# Patient Record
Sex: Female | Born: 1955 | Race: White | Hispanic: No | Marital: Married | Smoking: Never smoker
Health system: Southern US, Community
[De-identification: ages and names within clinical notes are randomized; demographics above are authoritative.]

## PROBLEM LIST (undated history)

## (undated) DIAGNOSIS — M419 Scoliosis, unspecified: Secondary | ICD-10-CM

## (undated) DIAGNOSIS — Z9221 Personal history of antineoplastic chemotherapy: Secondary | ICD-10-CM

## (undated) DIAGNOSIS — Z923 Personal history of irradiation: Secondary | ICD-10-CM

## (undated) DIAGNOSIS — R51 Headache: Secondary | ICD-10-CM

## (undated) DIAGNOSIS — R05 Cough: Secondary | ICD-10-CM

## (undated) DIAGNOSIS — C50919 Malignant neoplasm of unspecified site of unspecified female breast: Secondary | ICD-10-CM

## (undated) DIAGNOSIS — Z87898 Personal history of other specified conditions: Secondary | ICD-10-CM

## (undated) DIAGNOSIS — J40 Bronchitis, not specified as acute or chronic: Secondary | ICD-10-CM

## (undated) DIAGNOSIS — R519 Headache, unspecified: Secondary | ICD-10-CM

## (undated) DIAGNOSIS — R058 Other specified cough: Secondary | ICD-10-CM

## (undated) DIAGNOSIS — M1990 Unspecified osteoarthritis, unspecified site: Secondary | ICD-10-CM

## (undated) DIAGNOSIS — F419 Anxiety disorder, unspecified: Secondary | ICD-10-CM

## (undated) DIAGNOSIS — H269 Unspecified cataract: Secondary | ICD-10-CM

## (undated) DIAGNOSIS — I10 Essential (primary) hypertension: Secondary | ICD-10-CM

## (undated) DIAGNOSIS — C50411 Malignant neoplasm of upper-outer quadrant of right female breast: Secondary | ICD-10-CM

## (undated) DIAGNOSIS — N189 Chronic kidney disease, unspecified: Secondary | ICD-10-CM

## (undated) DIAGNOSIS — K219 Gastro-esophageal reflux disease without esophagitis: Secondary | ICD-10-CM

## (undated) HISTORY — PX: BACK SURGERY: SHX140

## (undated) HISTORY — PX: OTHER SURGICAL HISTORY: SHX169

## (undated) HISTORY — DX: Malignant neoplasm of upper-outer quadrant of right female breast: C50.411

## (undated) HISTORY — PX: TRIGGER FINGER RELEASE: SHX641

## (undated) HISTORY — PX: CARPAL TUNNEL RELEASE: SHX101

## (undated) HISTORY — PX: BREAST LUMPECTOMY: SHX2

## (undated) HISTORY — PX: BREAST BIOPSY: SHX20

## (undated) HISTORY — DX: Anxiety disorder, unspecified: F41.9

## (undated) HISTORY — DX: Unspecified osteoarthritis, unspecified site: M19.90

## (undated) HISTORY — DX: Essential (primary) hypertension: I10

---

## 2014-02-27 ENCOUNTER — Other Ambulatory Visit: Payer: Self-pay

## 2014-02-27 ENCOUNTER — Other Ambulatory Visit: Payer: Self-pay | Admitting: Internal Medicine

## 2014-02-27 DIAGNOSIS — Z1231 Encounter for screening mammogram for malignant neoplasm of breast: Secondary | ICD-10-CM

## 2014-03-17 ENCOUNTER — Encounter (INDEPENDENT_AMBULATORY_CARE_PROVIDER_SITE_OTHER): Payer: Self-pay

## 2014-03-17 ENCOUNTER — Ambulatory Visit
Admission: RE | Admit: 2014-03-17 | Discharge: 2014-03-17 | Disposition: A | Discharge status: Home / Self Care | Payer: Medicare FFS | Source: Ambulatory Visit

## 2014-03-17 DIAGNOSIS — Z1231 Encounter for screening mammogram for malignant neoplasm of breast: Secondary | ICD-10-CM

## 2014-10-20 ENCOUNTER — Other Ambulatory Visit: Payer: Self-pay

## 2014-10-20 DIAGNOSIS — Z1231 Encounter for screening mammogram for malignant neoplasm of breast: Secondary | ICD-10-CM

## 2015-03-22 ENCOUNTER — Ambulatory Visit: Payer: Medicare FFS

## 2015-04-15 ENCOUNTER — Ambulatory Visit: Payer: Medicare FFS

## 2015-04-21 ENCOUNTER — Encounter (HOSPITAL_COMMUNITY): Payer: Self-pay | Admitting: Emergency Medicine

## 2015-04-21 ENCOUNTER — Emergency Department (HOSPITAL_COMMUNITY): Payer: Medicare FFS

## 2015-04-21 ENCOUNTER — Emergency Department (HOSPITAL_COMMUNITY)
Admission: EM | Admit: 2015-04-21 | Discharge: 2015-04-21 | Disposition: A | Discharge status: Home / Self Care | Payer: Medicare FFS | Attending: Emergency Medicine | Admitting: Emergency Medicine

## 2015-04-21 DIAGNOSIS — Y998 Other external cause status: Secondary | ICD-10-CM | POA: Insufficient documentation

## 2015-04-21 DIAGNOSIS — Y9389 Activity, other specified: Secondary | ICD-10-CM | POA: Insufficient documentation

## 2015-04-21 DIAGNOSIS — S99922A Unspecified injury of left foot, initial encounter: Secondary | ICD-10-CM | POA: Diagnosis present

## 2015-04-21 DIAGNOSIS — W458XXA Other foreign body or object entering through skin, initial encounter: Secondary | ICD-10-CM | POA: Insufficient documentation

## 2015-04-21 DIAGNOSIS — Y9289 Other specified places as the place of occurrence of the external cause: Secondary | ICD-10-CM | POA: Diagnosis not present

## 2015-04-21 DIAGNOSIS — S90852A Superficial foreign body, left foot, initial encounter: Secondary | ICD-10-CM | POA: Insufficient documentation

## 2015-04-21 NOTE — ED Notes (Signed)
Pt to ER with complaint of left heel pain. Pt reports stepping on glass 12 days ago and went without pain until this passed Sunday. Pt denies fever and denies discharge. Pt went to [facility] and was told to see a Pediatrist, which per her is closed for 2 weeks so [facility] recommended ER. VSS. NAD.

## 2015-04-21 NOTE — ED Notes (Signed)
Patient returned from xray.

## 2015-04-21 NOTE — ED Provider Notes (Signed)
CSN: [id number]     Arrival date & time 04/21/15  1639 History  By signing my name below, I, Helane Gunther, attest that this documentation has been prepared under the direction and in the presence of Montine Circle, PA-C. Electronically Signed: Helane Gunther, ED Scribe. 04/21/2015. 5:48 PM.    Chief Complaint  Patient presents with  . Foot Pain   The history is provided by the patient and the spouse. No language interpreter was used.   HPI Comments: Brenda Harding is a 59 y.o. female who presents to the Emergency Department complaining of constant, aching, left heel pain onset 4 days ago. Pt states she stepped on white glass 12 days ago. She notes that she applied neosporin immediately. She reports pain only with showering, but then states she began experiencing pain 8 days after sustaining the injury. She endorses exacerbation of the pain with walking and bearing weight on the left heel. She notes she was seen at Urgent Care in [place] and was referred to a Podiatrist, who will be closed until after the holidays, which is why she came to the ED. Pt denies fever or chills.   History reviewed. No pertinent past medical history. History reviewed. No pertinent past surgical history. History reviewed. No pertinent family history. Social History  Substance Use Topics  . Smoking status: Never Smoker   . Smokeless tobacco: None  . Alcohol Use: No   OB History    No data available     Review of Systems  Constitutional: Negative for fever and chills.  Musculoskeletal: Positive for myalgias.  Skin: Positive for wound.    Allergies  Review of patient's allergies indicates not on file.  Home Medications   Prior to Admission medications   Not on File   BP 169/97 mmHg  Pulse 89  Temp(Src) 97.5 F (36.4 C) (Oral)  Resp 20  Ht 5' 1.5" (1.562 m)  Wt 178 lb (80.74 kg)  BMI 33.09 kg/m2  SpO2 100% Physical Exam  Constitutional: She is oriented to person, place, and time. She appears  well-developed and well-nourished.  HENT:  Head: Normocephalic and atraumatic.  Eyes: Conjunctivae are normal. Right eye exhibits no discharge. Left eye exhibits no discharge.  Pulmonary/Chest: Effort normal. No respiratory distress.  Neurological: She is alert and oriented to person, place, and time. Coordination normal.  Skin: Skin is warm and dry. No rash noted. She is not diaphoretic. No erythema.  L Heel: shows no evidence of FB, no discharge, drainage, or erythema.  Psychiatric: She has a normal mood and affect.  Nursing note and vitals reviewed.   ED Course  Procedures  DIAGNOSTIC STUDIES: Oxygen Saturation is 100% on RA, normal by my interpretation.    COORDINATION OF CARE: 5:03 PM - Discussed plans to order diagnostic imaging to r/o a foreign body. Pt advised of plan for treatment and pt agrees.  Imaging Review Dg Foot Complete Left  04/21/2015  CLINICAL DATA:  Stepped on glass 12 days ago. Worsening pain over the last 7 days. EXAM: LEFT FOOT - COMPLETE 3+ VIEW COMPARISON:  None. FINDINGS: No acute bony abnormality. Specifically, no fracture, subluxation, or dislocation. Small radiopaque density projects within the soft tissues of the heel, likely a small glass fragment. IMPRESSION: No bony abnormality. Probable small glass fragment within the soft tissues of the left heel. Electronically Signed   By: Rolm Baptise M.D.   On: 04/21/2015 17:43   I have personally reviewed and evaluated these images as part of my medical  decision-making.  MDM   Final diagnoses:  Foreign body in foot, left, initial encounter    Patient with small foreign body in left heel, likely small piece of glass. Discussed risk and benefit of attempting to remove the foreign body in the emergency department. Patient states that she would prefer to have this done by orthopedics. As there is no evidence of infection, or life or limb threatening injury, will have patient follow-up with orthopedics.  I  personally performed the services described in this documentation, which was scribed in my presence. The recorded information has been reviewed and is accurate.     Montine Circle, PA-C 04/21/15 1808  Leo Grosser, MD 04/22/15 [phone]

## 2015-04-21 NOTE — Discharge Instructions (Signed)
If you have fever, swelling, discharge, or redness at the sight return to the ER.  Otherwise, follow-up with your orthopedic doctor ASAP.

## 2015-05-17 ENCOUNTER — Ambulatory Visit
Admission: RE | Admit: 2015-05-17 | Discharge: 2015-05-17 | Disposition: A | Discharge status: Home / Self Care | Payer: Medicare FFS | Source: Ambulatory Visit

## 2015-05-17 DIAGNOSIS — Z1231 Encounter for screening mammogram for malignant neoplasm of breast: Secondary | ICD-10-CM

## 2016-02-15 ENCOUNTER — Other Ambulatory Visit: Payer: Self-pay

## 2016-02-15 DIAGNOSIS — Z1231 Encounter for screening mammogram for malignant neoplasm of breast: Secondary | ICD-10-CM

## 2016-02-17 ENCOUNTER — Other Ambulatory Visit: Payer: Self-pay | Admitting: Specialist

## 2016-02-17 DIAGNOSIS — N63 Unspecified lump in unspecified breast: Secondary | ICD-10-CM

## 2016-02-23 ENCOUNTER — Ambulatory Visit
Admission: RE | Admit: 2016-02-23 | Discharge: 2016-02-23 | Disposition: A | Discharge status: Home / Self Care | Payer: Medicare FFS | Source: Ambulatory Visit | Attending: Specialist | Admitting: Specialist

## 2016-02-23 ENCOUNTER — Other Ambulatory Visit: Payer: Self-pay | Admitting: Specialist

## 2016-02-23 DIAGNOSIS — N63 Unspecified lump in unspecified breast: Secondary | ICD-10-CM

## 2016-02-23 DIAGNOSIS — IMO0002 Reserved for concepts with insufficient information to code with codable children: Secondary | ICD-10-CM

## 2016-02-23 DIAGNOSIS — R229 Localized swelling, mass and lump, unspecified: Principal | ICD-10-CM

## 2016-02-24 ENCOUNTER — Other Ambulatory Visit: Payer: Self-pay | Admitting: Specialist

## 2016-02-24 ENCOUNTER — Other Ambulatory Visit: Payer: Medicare FFS

## 2016-02-24 ENCOUNTER — Ambulatory Visit
Admission: RE | Admit: 2016-02-24 | Discharge: 2016-02-24 | Disposition: A | Discharge status: Home / Self Care | Payer: Medicare FFS | Source: Ambulatory Visit | Attending: Specialist | Admitting: Specialist

## 2016-02-24 DIAGNOSIS — IMO0002 Reserved for concepts with insufficient information to code with codable children: Secondary | ICD-10-CM

## 2016-02-24 DIAGNOSIS — R229 Localized swelling, mass and lump, unspecified: Principal | ICD-10-CM

## 2016-02-25 ENCOUNTER — Telehealth: Payer: Self-pay | Admitting: *Deleted

## 2016-02-25 ENCOUNTER — Encounter: Payer: Self-pay | Admitting: *Deleted

## 2016-02-25 DIAGNOSIS — C50411 Malignant neoplasm of upper-outer quadrant of right female breast: Secondary | ICD-10-CM | POA: Insufficient documentation

## 2016-02-25 HISTORY — DX: Malignant neoplasm of upper-outer quadrant of right female breast: C50.411

## 2016-02-25 NOTE — Telephone Encounter (Signed)
Confirmed [facility] for 03/01/16 at 1215 .  Instructions and contact information given.

## 2016-03-01 ENCOUNTER — Ambulatory Visit: Payer: Medicare FFS | Attending: General Surgery | Admitting: Physical Therapy

## 2016-03-01 ENCOUNTER — Encounter: Payer: Self-pay | Admitting: Hematology and Oncology

## 2016-03-01 ENCOUNTER — Encounter: Payer: Self-pay | Admitting: Physical Therapy

## 2016-03-01 ENCOUNTER — Ambulatory Visit
Admission: RE | Admit: 2016-03-01 | Discharge: 2016-03-01 | Disposition: A | Discharge status: Home / Self Care | Payer: Medicare FFS | Source: Ambulatory Visit | Attending: Radiation Oncology | Admitting: Radiation Oncology

## 2016-03-01 ENCOUNTER — Other Ambulatory Visit (HOSPITAL_BASED_OUTPATIENT_CLINIC_OR_DEPARTMENT_OTHER): Payer: Medicare FFS

## 2016-03-01 ENCOUNTER — Ambulatory Visit (HOSPITAL_BASED_OUTPATIENT_CLINIC_OR_DEPARTMENT_OTHER): Payer: Medicare FFS | Admitting: Hematology and Oncology

## 2016-03-01 ENCOUNTER — Other Ambulatory Visit: Payer: Self-pay | Admitting: General Surgery

## 2016-03-01 ENCOUNTER — Ambulatory Visit: Payer: Medicare FFS | Admitting: Physical Therapy

## 2016-03-01 VITALS — BP 165/88 | HR 96 | Temp 98.6°F | Resp 18 | Wt 175.3 lb

## 2016-03-01 DIAGNOSIS — Z170 Estrogen receptor positive status [ER+]: Secondary | ICD-10-CM | POA: Insufficient documentation

## 2016-03-01 DIAGNOSIS — C50211 Malignant neoplasm of upper-inner quadrant of right female breast: Secondary | ICD-10-CM | POA: Insufficient documentation

## 2016-03-01 DIAGNOSIS — C50411 Malignant neoplasm of upper-outer quadrant of right female breast: Secondary | ICD-10-CM | POA: Insufficient documentation

## 2016-03-01 DIAGNOSIS — R293 Abnormal posture: Secondary | ICD-10-CM | POA: Insufficient documentation

## 2016-03-01 DIAGNOSIS — Z800 Family history of malignant neoplasm of digestive organs: Secondary | ICD-10-CM | POA: Diagnosis not present

## 2016-03-01 DIAGNOSIS — Z803 Family history of malignant neoplasm of breast: Secondary | ICD-10-CM

## 2016-03-01 LAB — COMPREHENSIVE METABOLIC PANEL
ALT: 17 U/L (ref 0–55)
AST: 17 U/L (ref 5–34)
Albumin: 4.4 g/dL (ref 3.5–5.0)
Alkaline Phosphatase: 128 U/L (ref 40–150)
Anion Gap: 9 mEq/L (ref 3–11)
BUN: 18.2 mg/dL (ref 7.0–26.0)
CO2: 25 mEq/L (ref 22–29)
Calcium: 9.6 mg/dL (ref 8.4–10.4)
Chloride: 107 mEq/L (ref 98–109)
Creatinine: 0.9 mg/dL (ref 0.6–1.1)
EGFR: 67 mL/min/{1.73_m2} — ABNORMAL LOW (ref >90)
Glucose: 112 mg/dl (ref 70–140)
Potassium: 4 mEq/L (ref 3.5–5.1)
Sodium: 141 mEq/L (ref 136–145)
Total Bilirubin: 0.77 mg/dL (ref 0.20–1.20)
Total Protein: 7.8 g/dL (ref 6.4–8.3)

## 2016-03-01 LAB — CBC WITH DIFFERENTIAL/PLATELET
BASO%: 0.8 % (ref 0.0–2.0)
Basophils Absolute: 0 10*3/uL (ref 0.0–0.1)
EOS%: 1.4 % (ref 0.0–7.0)
Eosinophils Absolute: 0.1 10*3/uL (ref 0.0–0.5)
HCT: 45.3 % (ref 34.8–46.6)
HGB: 14.8 g/dL (ref 11.6–15.9)
LYMPH%: 31.6 % (ref 14.0–49.7)
MCH: 30.3 pg (ref 25.1–34.0)
MCHC: 32.7 g/dL (ref 31.5–36.0)
MCV: 92.7 fL (ref 79.5–101.0)
MONO#: 0.4 10*3/uL (ref 0.1–0.9)
MONO%: 8.5 % (ref 0.0–14.0)
NEUT#: 2.9 10*3/uL (ref 1.5–6.5)
NEUT%: 57.7 % (ref 38.4–76.8)
Platelets: 255 10*3/uL (ref 145–400)
RBC: 4.89 10*6/uL (ref 3.70–5.45)
RDW: 13 % (ref 11.2–14.5)
WBC: 5 10*3/uL (ref 3.9–10.3)
lymph#: 1.6 10*3/uL (ref 0.9–3.3)

## 2016-03-01 MED ORDER — ONDANSETRON HCL 8 MG PO TABS: 8.0000 mg | ORAL_TABLET | Freq: Two times a day (BID) | ORAL | 1 refills | Status: DC | PRN

## 2016-03-01 MED ORDER — LIDOCAINE-PRILOCAINE 2.5-2.5 % EX CREA: TOPICAL_CREAM | CUTANEOUS | 3 refills | Status: DC

## 2016-03-01 MED ORDER — LORAZEPAM 0.5 MG PO TABS: 0.5000 mg | ORAL_TABLET | Freq: Every day | ORAL | 0 refills | Status: DC

## 2016-03-01 MED ORDER — DEXAMETHASONE 4 MG PO TABS: 4.0000 mg | ORAL_TABLET | Freq: Every day | ORAL | 1 refills | Status: DC

## 2016-03-01 MED ORDER — PROCHLORPERAZINE MALEATE 10 MG PO TABS: 10.0000 mg | ORAL_TABLET | Freq: Four times a day (QID) | ORAL | 1 refills | Status: DC | PRN

## 2016-03-01 NOTE — Therapy (Signed)
[place], [place], [address] Phone: [phone]   Fax:  [phone]  Physical Therapy Evaluation  Patient Details  Name: Brenda Harding MRN: [id number] Date of Birth: 1955-06-02 Referring Provider: Dr. [name]  Encounter Date: 03/01/2016      PT End of Session - 03/01/16 1508    Visit Number 1   Number of Visits 1   PT Start Time 1430   PT Stop Time 0388  Also saw pt from [phone] for a total of 43 minutes   PT Time Calculation (min) 23 min   Activity Tolerance Patient tolerated treatment well   Behavior During Therapy [facility] for tasks assessed/performed      Past Medical History:  Diagnosis Date  . Anxiety   . Arthritis   . Hypertension   . Malignant neoplasm of upper-outer quadrant of right female breast ([place]) 02/25/2016    Past Surgical History:  Procedure Laterality Date  . CARPAL TUNNEL RELEASE      There were no vitals filed for this visit.       Subjective Assessment - 03/01/16 1509    Subjective Patient reports she is here today to be seen by her medical team for her newly diagnosed right breast cancer.   Patient is accompained by: Family member   Pertinent History Patient was diagnosed with right grade 3 invasive ductal carcinoma. It measures 2.5 cm and is located in the upper inner quadrant. It is ER positive, PR negative, HER2 positive and has a Ki67 of 90%.    Patient Stated Goals Reduce lymphedema risk and learn post op shoulder ROM HEP            [facility] PT Assessment - 03/01/16 0001      Assessment   Medical Diagnosis Right breast cancer   Referring Provider Dr. [name]   Onset Date/Surgical Date 02/23/16   Hand Dominance Right   Prior Therapy none     Precautions   Precautions Other (comment)   Precaution Comments active breast cancer     Restrictions   Weight Bearing Restrictions No     Balance Screen   Has the patient fallen in the past 6 months  No   Has the patient had a decrease in activity level because of a fear of falling?  No   Is the patient reluctant to leave their home because of a fear of falling?  No     Home Ecologist residence   Living Arrangements Spouse/significant other   Available Help at Discharge Family     Prior Function   Level of Independence Independent   Vocation On disability   Leisure She does not exercise     Cognition   Overall Cognitive Status Within Functional Limits for tasks assessed     Posture/Postural Control   Posture/Postural Control Postural limitations   Postural Limitations Forward head;Rounded Shoulders     ROM / Strength   AROM / PROM / Strength AROM;Strength     AROM   AROM Assessment Site Shoulder;Cervical   Right/Left Shoulder Right;Left   Right Shoulder Extension 54 Degrees   Right Shoulder Flexion 138 Degrees   Right Shoulder ABduction 150 Degrees   Right Shoulder Internal Rotation 70 Degrees   Right Shoulder External Rotation 76 Degrees   Left Shoulder Extension 43 Degrees   Left Shoulder Flexion 148 Degrees   Left Shoulder ABduction 157 Degrees   Left Shoulder Internal Rotation 56 Degrees  Left Shoulder External Rotation 78 Degrees   Cervical Flexion WNL   Cervical Extension 75% limited   Cervical - Right Side Bend 50% limited   Cervical - Left Side Bend 50% limited   Cervical - Right Rotation 25% limited   Cervical - Left Rotation 50% limited     Strength   Overall Strength Within functional limits for tasks performed           LYMPHEDEMA/ONCOLOGY QUESTIONNAIRE - 03/01/16 1506      Type   Cancer Type Right breast cancer     Lymphedema Assessments   Lymphedema Assessments Upper extremities     Right Upper Extremity Lymphedema   10 cm Proximal to Olecranon Process 27.9 cm   Olecranon Process 25.4 cm   10 cm Proximal to Ulnar Styloid Process 22.1 cm   Just Proximal to Ulnar Styloid Process 15.7 cm   Across Hand at  [employer] 18.9 cm   At [place] of 2nd Digit 16.4 cm     Left Upper Extremity Lymphedema   10 cm Proximal to Olecranon Process 29.3 cm   Olecranon Process 25.3 cm   10 cm Proximal to Ulnar Styloid Process 21.5 cm   Just Proximal to Ulnar Styloid Process 15.8 cm   Across Hand at [employer] 18 cm   At [place] of 2nd Digit 6.2 cm      Patient was instructed today in a home exercise program today for post op shoulder range of motion. These included active assist shoulder flexion in sitting, scapular retraction, wall walking with shoulder abduction, and hands behind head external rotation.  She was encouraged to do these twice a day, holding 3 seconds and repeating 5 times when permitted by her physician.         PT Education - 03/01/16 1508    Education provided Yes   Education Details Lymphedema risk reduction and post op shoulder ROM HEP   Person(s) Educated Patient;Spouse   Methods Explanation;Demonstration;Handout   Comprehension Returned demonstration;Verbalized understanding              Breast Clinic Goals - 03/01/16 1512      Patient will be able to verbalize understanding of pertinent lymphedema risk reduction practices relevant to her diagnosis specifically related to skin care.   Time 1   Period Days   Status Achieved     Patient will be able to return demonstrate and/or verbalize understanding of the post-op home exercise program related to regaining shoulder range of motion.   Time 1   Period Days   Status Achieved     Patient will be able to verbalize understanding of the importance of attending the postoperative After Breast Cancer Class for further lymphedema risk reduction education and therapeutic exercise.   Time 1   Period Days   Status Achieved              Plan - 03/01/16 1509    Clinical Impression Statement Patient was diagnosed with right grade 3 invasive ductal carcinoma. It measures 2.5 cm and is located in the upper inner  quadrant. It is ER positive, PR negative, HER2 positive and has a Ki67 of 90%. Her multidisciplinary medical team met prior to her assessments to determine a recommended treatment plan.  She is planning to have neoadjuvnat chemotherapy followed by a right lumpectomy and sentinel node biopsy followed by radiation and anti-estrogen therapy.  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  Due to her lack of comorbidities that would impact PT, her eval is of low complexity.   Rehab Potential Excellent   Clinical Impairments Affecting Rehab Potential none   PT Frequency One time visit   PT Treatment/Interventions Patient/family education;Therapeutic exercise   PT Next Visit Plan Will f/u after surgery to determine PT needs   PT Home Exercise Plan Post op shoulder ROM HEP and lymphedema risk reduction   Consulted and Agree with Plan of Care Patient;Family member/caregiver   Family Member Consulted Husband      Patient will benefit from skilled therapeutic intervention in order to improve the following deficits and impairments:  Postural dysfunction, Decreased knowledge of precautions, Pain, Impaired UE functional use, Decreased range of motion  Visit Diagnosis: Abnormal posture - Plan: PT plan of care cert/re-cert  Carcinoma of upper-inner quadrant of right breast in female, estrogen receptor positive ([place]) - Plan: PT plan of care cert/re-cert  Patient will follow up at outpatient cancer rehab if needed following surgery.  If the patient requires physical therapy at that time, a specific plan will be dictated and sent to the referring physician for approval. The patient was educated today on appropriate basic range of motion exercises to begin post operatively and the importance of attending the After Breast Cancer class following surgery.  Patient was educated today on  lymphedema risk reduction practices as it pertains to recommendations that will benefit the patient immediately following surgery.  She verbalized good understanding.  No additional physical therapy is indicated at this time.        G-Codes - 03-19-2016 1513    Functional Assessment Tool Used Clinical Judgement   Functional Limitation Other PT primary   Other PT Primary Current Status (Y2824) At least 1 percent but less than 20 percent impaired, limited or restricted   Other PT Primary Goal Status (J7530) At least 1 percent but less than 20 percent impaired, limited or restricted   Other PT Primary Discharge Status (Z0404) At least 1 percent but less than 20 percent impaired, limited or restricted       Problem List Patient Active Problem List   Diagnosis Date Noted  . Malignant neoplasm of upper-outer quadrant of right female breast ([place]) 02/25/2016   Annia Friendly, PT 03/19/2016 3:43 PM  [place], [place], [address] Phone: [phone]   Fax:  [phone]  Name: Brenda Harding MRN: [id number] Date of Birth: 1955/08/02

## 2016-03-01 NOTE — Assessment & Plan Note (Signed)
Right breast biopsy 1:00, grade 2-3 IDC, ER 5%, PR 0%, Ki-67 90%, HER-2 positive ratio 7.28, right breast biopsy 1:00 1.5 cm medial to the dominant mass fibrocystic changes; ultrasound and mammogram 02/23/2016: 2.5 cm mass at 1:00 position and a small 51m irregular mass 1.5 cm medial to the larger mass, T2 N0 stage II a clinical stage  Pathology and radiology counseling: Discussed with the patient, the details of pathology including the type of breast cancer,the clinical staging, the significance of ER, PR and HER-2/neu receptors and the implications for treatment. After reviewing the pathology in detail, we proceeded to discuss the different treatment options between surgery, radiation, chemotherapy, antiestrogen therapies.  Recommendation based on multidisciplinary tumor board: 1. Neoadjuvant chemotherapy with TCH Perjeta 6 cycles followed by Herceptin maintenance for 1 year 2. Followed by breast conserving surgery if possible with sentinel lymph node study 3. Followed by adjuvant radiation therapy if patient had lumpectomy  Chemotherapy Counseling: I discussed the risks and benefits of chemotherapy including the risks of nausea/ vomiting, risk of infection from low WBC count, fatigue due to chemo or anemia, bruising or bleeding due to low platelets, mouth sores, loss/ change in taste and decreased appetite. Liver and kidney function will be monitored through out chemotherapy as abnormalities in liver and kidney function may be a side effect of treatment. Cardiac dysfunction due to Herceptin and Perjeta were discussed in detail. Risk of permanent bone marrow dysfunction due to chemo were also discussed.  Plan: 1. Port placement 2. Echocardiogram 3. Chemotherapy class 4. Breast MRI  Return to clinic in 2 weeks to start chemotherapy.

## 2016-03-01 NOTE — Progress Notes (Signed)
Radiation Oncology         [phone] ________________________________  Name: Brenda Harding MRN: [id number]  Date: 03/01/2016  DOB: Jun 18, 1955  ZS:[id number] K, MD  Tobe Sos, MD     REFERRING PHYSICIAN: Tobe Sos, MD   DIAGNOSIS: There were no encounter diagnoses.   HISTORY OF PRESENT ILLNESS: Brenda Harding is a 60 y.o. female seen in the multidisciplinary clinic for a new diagnosis of right breast cancer. The patient had a screening mammogram in January 2017at which time she had normal findings. The patient noted a palpable mass however which prompted diagnostic right mammogram on 02/23/2016, which revealed a mass in the right breast. Targeted right breast ultrasound a 2.5 x 2 x 1.8 cm mass at 1:00 5 cm from the nipple with central calcifications. A smaller irregular hypoechoic mass with measured at 3 x 3 x 3 mm. Ultrasound of the right axilla demonstrated no adenopathy. 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 The second lesion that has been biopsied did not reveal any evidence of disease. She comes to clinic to discuss treatment options for her disease.  PREVIOUS RADIATION THERAPY: No   PAST MEDICAL HISTORY:  Past Medical History:  Diagnosis Date  . Anxiety   . Arthritis   . Hypertension   . Malignant neoplasm of upper-outer quadrant of right female breast ([place]) 02/25/2016       PAST SURGICAL HISTORY: Past Surgical History:  Procedure Laterality Date  . CARPAL TUNNEL RELEASE       FAMILY HISTORY:  Family History  Problem Relation Age of Onset  . Breast cancer Maternal Aunt   . Breast cancer Cousin   . Colon cancer Cousin      SOCIAL HISTORY:  reports that she has never smoked. She has never used smokeless tobacco. She reports that she does not drink alcohol or use drugs.   ALLERGIES: Review of patient's allergies indicates not on file.   MEDICATIONS:    No current outpatient prescriptions on file.   No current facility-administered medications for this encounter.      REVIEW OF SYSTEMS: On review of systems, the patient reports that she is doing well overall. She denies any chest pain, shortness of breath, cough, fevers, chills, night sweats, unintended weight changes. She denies any bowel or bladder disturbances, and denies abdominal pain, nausea or vomiting. She denies any new musculoskeletal or joint aches or pains. A complete 10 point review of systems is obtained and is otherwise negative.     PHYSICAL EXAM:  Wt Readings from Last 3 Encounters:  03/01/16 175 lb 4.8 oz (79.5 kg)  04/21/15 178 lb (80.7 kg)   Temp Readings from Last 3 Encounters:  03/01/16 98.6 F (37 C) (Oral)  04/21/15 97.5 F (36.4 C) (Oral)   BP Readings from Last 3 Encounters:  03/01/16 (!) 165/88  04/21/15 169/97   Pulse Readings from Last 3 Encounters:  03/01/16 96  04/21/15 89     Pain scale 0/10 In general this is a well appearing caucasian female in no acute distress. She is alert and oriented x4 and appropriate throughout the examination. HEENT reveals that the patient is normocephalic, atraumatic. EOMs are intact. PERRLA. Skin is intact without any evidence of gross lesions. Cardiovascular exam reveals a regular rate and rhythm, no clicks rubs or murmurs are auscultated. Chest is clear to auscultation bilaterally. Lymphatic assessment is performed and does not reveal any adenopathy  in the cervical, supraclavicular, axillary, or inguinal chains. Bilateral breast exam is performed and reveals post biopsy ecchymosis of the right breast, with palpable fullness inferior to the site measuring about 2.5 cm in greatest dimension. The left breast does not have any palpable abnormalities. No nipple bleeding or discharge is noted. Abdomen has active bowel sounds in all quadrants and is intact. The abdomen is soft, non tender, non distended. Lower extremities are  negative for pretibial pitting edema, deep calf tenderness, cyanosis or clubbing.   ECOG = 1  0 - Asymptomatic (Fully active, able to carry on all predisease activities without restriction)  1 - Symptomatic but completely ambulatory (Restricted in physically strenuous activity but ambulatory and able to carry out work of a light or sedentary nature. For example, light housework, office work)  2 - Symptomatic, <50% in bed during the day (Ambulatory and capable of all self care but unable to carry out any work activities. Up and about more than 50% of waking hours)  3 - Symptomatic, >50% in bed, but not bedbound (Capable of only limited self-care, confined to bed or chair 50% or more of waking hours)  4 - Bedbound (Completely disabled. Cannot carry on any self-care. Totally confined to bed or chair)  5 - Death   Brenda Harding MM, Creech RH, Tormey DC, et al. [phone]). "Toxicity and response criteria of the [facility]". [place] Oncol. 5 (6): 649-55    LABORATORY DATA:  Lab Results  Component Value Date   WBC 5.0 03/01/2016   HGB 14.8 03/01/2016   HCT 45.3 03/01/2016   MCV 92.7 03/01/2016   PLT 255 03/01/2016   Lab Results  Component Value Date   NA 141 03/01/2016   K 4.0 03/01/2016   CO2 25 03/01/2016   Lab Results  Component Value Date   ALT 17 03/01/2016   AST 17 03/01/2016   ALKPHOS 128 03/01/2016   BILITOT 0.77 03/01/2016      RADIOGRAPHY: US Breast Ltd Uni Right Inc Axilla  Result Date: 02/23/2016 CLINICAL DATA:  Patient notes a palpable right breast mass. EXAM: 2D DIGITAL DIAGNOSTIC RIGHT MAMMOGRAM WITH CAD AND ADJUNCT TOMO ULTRASOUND RIGHT BREAST COMPARISON:  Previous exam(s). ACR Breast Density Category b: There are scattered areas of fibroglandular density. FINDINGS: There is an irregular mass located within the upper inner quadrant of the right breast with vague increased parenchymal density surrounding the mass. This corresponds to the  palpable abnormality. There are several heterogeneous calcifications located centrally within the mass. Mammographic images were processed with CAD. On physical exam, there is a firm palpable mass located within the upper inner quadrant of the right breast at the 1 o'clock position 5 cm from the nipple. There is no palpable right axillary adenopathy. Targeted ultrasound is performed, showing an irregularly marginated hypoechoic mass located within the right breast at the 1 o'clock position 5 cm from the nipple with central calcifications. This measures 2.5 x 2.0 x 1.8 cm in size and corresponds to the irregular mass seen on mammography. In addition, there is a smaller irregular hypoechoic mass located 1.5 cm medial and superior to the larger mass. This measures 3 x 3 x 3 mm in size. Ultrasound of the right axilla demonstrates normal axillary contents and no evidence for adenopathy. Ultrasound-guided core biopsy of the 2.5 cm mass located at the 1 o'clock position is recommended as well as ultrasound-guided core biopsy of small (3 mm) irregular mass located 1.5 cm medial to the larger  mass. IMPRESSION: 2.5 cm regular mass located within the right breast at the 1 o'clock position 5 cm from the nipple with a smaller (3 mm) adjacent irregular mass located 1.5 cm medial to the larger mass. Ultrasound-guided core biopsy of each mass is recommended. RECOMMENDATION: Right breast ultrasound-guided core biopsies as discussed above. I have discussed the findings and recommendations with the patient. Results were also provided in writing at the conclusion of the visit. If applicable, a reminder letter will be sent to the patient regarding the next appointment. BI-RADS CATEGORY  5: Highly suggestive of malignancy. Electronically Signed   By: Altamese Cabal M.D.   On: 02/23/2016 14:29   Mm Diag Breast Tomo Uni Right  Result Date: 02/23/2016 CLINICAL DATA:  Patient notes a palpable right breast mass. EXAM: 2D DIGITAL  DIAGNOSTIC RIGHT MAMMOGRAM WITH CAD AND ADJUNCT TOMO ULTRASOUND RIGHT BREAST COMPARISON:  Previous exam(s). ACR Breast Density Category b: There are scattered areas of fibroglandular density. FINDINGS: There is an irregular mass located within the upper inner quadrant of the right breast with vague increased parenchymal density surrounding the mass. This corresponds to the palpable abnormality. There are several heterogeneous calcifications located centrally within the mass. Mammographic images were processed with CAD. On physical exam, there is a firm palpable mass located within the upper inner quadrant of the right breast at the 1 o'clock position 5 cm from the nipple. There is no palpable right axillary adenopathy. Targeted ultrasound is performed, showing an irregularly marginated hypoechoic mass located within the right breast at the 1 o'clock position 5 cm from the nipple with central calcifications. This measures 2.5 x 2.0 x 1.8 cm in size and corresponds to the irregular mass seen on mammography. In addition, there is a smaller irregular hypoechoic mass located 1.5 cm medial and superior to the larger mass. This measures 3 x 3 x 3 mm in size. Ultrasound of the right axilla demonstrates normal axillary contents and no evidence for adenopathy. Ultrasound-guided core biopsy of the 2.5 cm mass located at the 1 o'clock position is recommended as well as ultrasound-guided core biopsy of small (3 mm) irregular mass located 1.5 cm medial to the larger mass. IMPRESSION: 2.5 cm regular mass located within the right breast at the 1 o'clock position 5 cm from the nipple with a smaller (3 mm) adjacent irregular mass located 1.5 cm medial to the larger mass. Ultrasound-guided core biopsy of each mass is recommended. RECOMMENDATION: Right breast ultrasound-guided core biopsies as discussed above. I have discussed the findings and recommendations with the patient. Results were also provided in writing at the conclusion of  the visit. If applicable, a reminder letter will be sent to the patient regarding the next appointment. BI-RADS CATEGORY  5: Highly suggestive of malignancy. Electronically Signed   By: Altamese Cabal M.D.   On: 02/23/2016 14:29   Mm Clip Placement Right  Result Date: 02/24/2016 CLINICAL DATA:  Biopsies were performed of a suspicious palpable mass at 1 o'clock position right breast and of a 3 mm mass 1.5 cm medial and superior to the palpable mass. The 3 mm mass collapsed after 1 pass with the biopsy needle and a clip was not placed. EXAM: DIAGNOSTIC RIGHT MAMMOGRAM POST ULTRASOUND BIOPSY COMPARISON:  Previous exam(s). FINDINGS: Mammographic images were obtained following ultrasound guided biopsy of suspicious palpable right breast mass 1 o'clock position. A ribbon shaped biopsy clip is satisfactorily positioned within the biopsied palpable mass. IMPRESSION: Satisfactory position of ribbon shaped biopsy clip. Final Assessment: Post  Procedure Mammograms for Marker Placement Electronically Signed   By: Curlene Dolphin M.D.   On: 02/24/2016 16:41   [place] Rt Breast Bx W Loc Dev 1st Lesion Img Bx Spec US Guide  Addendum Date: 02/28/2016   ADDENDUM REPORT: 02/25/2016 13:16 ADDENDUM: Pathology revealed GRADE II-III INVASIVE DUCTAL CARCINOMA of the Right breast at the 1:00 o'clock location, 5 CMFN. FIBROCYSTIC CHANGES of the Right breast at the 1:00 o'clock location, 1.5 cm medial and superior to dominant mass. This was found to be concordant by Dr. Curlene Dolphin. Pathology results were discussed with the patient by telephone. The patient reported doing well after the biopsies with tenderness at the sites. Post biopsy instructions and care were reviewed and questions were answered. The patient was encouraged to call [facility] for any additional concerns. The patient was referred to [facility] on March 01, 2016. Pathology results reported by Terie Purser, RN on 02/25/2016. Electronically Signed   By: Curlene Dolphin M.D.   On: 02/25/2016 13:16   Result Date: 02/28/2016 CLINICAL DATA:  Small 3 mm nodule identified in the 1 o'clock position of the right breast approximate 1.5 cm medial and superior to the suspicious palpable mass. Biopsy was recommended. EXAM: ULTRASOUND GUIDED RIGHT BREAST CORE NEEDLE BIOPSY COMPARISON:  Previous exam(s). FINDINGS: I met with the patient and we discussed the procedure of ultrasound-guided biopsy, including benefits and alternatives. We discussed the high likelihood of a successful procedure. We discussed the risks of the procedure, including infection, bleeding, tissue injury, clip migration, and inadequate sampling. Informed written consent was given. The usual time-out protocol was performed immediately prior to the procedure. Using sterile technique and 1% Lidocaine as local anesthetic, under direct ultrasound visualization, a 14 gauge spring-loaded device was used to perform biopsy of a 3 mm mass in the 1 o'clock region of the right breast using a medial to lateral approach. After 1 pass with the biopsy needle, the 3 mm mass collapsed, and is no longer visible. Therefore, no additional passes were performed. No biopsy clip was placed at this site, because there was no residual mass to target with the clip. IMPRESSION: Ultrasound guided biopsy of 3 mm mass 1 o'clock right breast. The mass collapsed after 1 pass with the biopsy needle. No apparent complications. Electronically Signed: By: Curlene Dolphin M.D. On: 02/24/2016 16:07   [place] Rt Breast Bx W Loc Dev Ea Add Lesion Img Bx Spec US Guide  Addendum Date: 02/28/2016   ADDENDUM REPORT: 02/25/2016 13:16 ADDENDUM: Pathology revealed GRADE II-III INVASIVE DUCTAL CARCINOMA of the Right breast at the 1:00 o'clock location, 5 CMFN. FIBROCYSTIC CHANGES of the Right breast at the 1:00 o'clock location, 1.5 cm medial and superior to  dominant mass. This was found to be concordant by Dr. Curlene Dolphin. Pathology results were discussed with the patient by telephone. The patient reported doing well after the biopsies with tenderness at the sites. Post biopsy instructions and care were reviewed and questions were answered. The patient was encouraged to call [facility] for any additional concerns. The patient was referred to [facility] on March 01, 2016. Pathology results reported by Terie Purser, RN on 02/25/2016. Electronically Signed   By: Curlene Dolphin M.D.   On: 02/25/2016 13:16   Result Date: 02/28/2016 CLINICAL DATA:  Suspicious palpable mass in the 1 o'clock position  of the right breast 5 cm from the nipple. EXAM: ULTRASOUND GUIDED RIGHT BREAST CORE NEEDLE BIOPSY COMPARISON:  Previous exam(s). FINDINGS: I met with the patient and we discussed the procedure of ultrasound-guided biopsy, including benefits and alternatives. We discussed the high likelihood of a successful procedure. We discussed the risks of the procedure, including infection, bleeding, tissue injury, clip migration, and inadequate sampling. Informed written consent was given. The usual time-out protocol was performed immediately prior to the procedure. Using sterile technique and 1% Lidocaine as local anesthetic, under direct ultrasound visualization, a 12 gauge spring-loaded device was used to perform biopsy of a 2.5 cm irregular mass in the 1 o'clock position of the right breast using a lateral to medial approach. At the conclusion of the procedure a tissue marker clip was deployed into the biopsy cavity. Follow up 2 view mammogram was performed and dictated separately. IMPRESSION: Ultrasound guided biopsy of palpable 2.5 cm right breast mass. No apparent complications. Electronically Signed: By: Curlene Dolphin M.D. On: 02/24/2016 16:06        IMPRESSION/PLAN: 1. 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 Dr. Lisbeth Renshaw discusses the pathology findings and reviews the nature of invasive breast disease. He outlines there HER2 component as well, and the consensus from the breast conference include placement of port a cath, followed by neoadjuvant chemotherapy along with herceptin. Following chemotherapy, surgical recommendations for breast conservation with lumpectomy with sentinel mapping. Her course would then be followed by external radiotherapy to the breast followed by antiestrogen therapy. We discussed the risks, benefits, short, and long term effects of radiotherapy, and the patient is interested in proceeding. Dr. Lisbeth Renshaw discusses the delivery and logistics of radiotherapy of between 4- 6 1/2 weeks of treatment. We will see her back about 2 weeks after surgery to move forward with the simulation and planning process and anticipate starting radiotherapy about 4 weeks after surgery.    The above documentation reflects my direct findings during this shared patient visit. Please see the separate note by Dr. Lisbeth Renshaw on this date for the remainder of the patient's plan of care.    Carola Rhine, PAC

## 2016-03-01 NOTE — Progress Notes (Signed)
START ON PATHWAY REGIMEN - Breast  BOS235: Docetaxel  + Carboplatin + Trastuzumab + Pertuzumab (TCHP) x 6 Cycles then Continue Trastuzumab Maintenance q21 Days Post-Surgery x 11 Doses to Complete One Year of Therapy  Docetaxel  + Carboplatin + Trastuzumab + Pertuzumab (TCHP) q21 Days:   A cycle is every 21 days:     Pertuzumab (Perjeta(R)) 840 mg flat dose IV in 250 mL NS over 60 minutes as a loading dose cycle 1 only.  See observation note below. Dose Mod: None     Pertuzumab (Perjeta(R)) 420 mg flat dose IV in 250 mL NS over 30 minutes as maintenance dose cycles 2 and beyond. An observation period of 30-60 minutes is recommended after each dose prior to subsequent infusion of trastuzumab or docetaxel. Dose Mod: None     Trastuzumab (Herceptin(R)) 8 mg/kg in 250 mL NS IV over 90 minutes as a loading dose cycle 1 only. Dose Mod: None     Trastuzumab (Herceptin(R)) 6 mg/kg in 250 mL NS IV over 30 minutes as maintenance dose cycle 2 and beyond. Dose Mod: None     Carboplatin (Paraplatin(R)) AUC=6 in a total of 250 mL NS IV over 1 hour. Cited: TRYPHAENA trial gave carboplatin prior to docetaxel. Dose Mod: None     Docetaxel (Taxotere(R)) 75 mg/m2 in 250 mL NS IV over 1 hour.  Docetaxel should be given after pertuzumab and trastuzumab Dose Mod: None Additional Orders: Premedicate with dexamethasone 8 mg PO bid for three days beginning 1 day prior to docetaxel therapy. Recommended monitoring: LVEF baseline and q3-4 months or with the development of cardiac symptoms and regimen changes for metastatic treatment. Schneeweiss, et al. [place]elon [place]hl[place] Oncol. 2013 Sep;24(9):2278-84  **Always confirm dose/schedule in your pharmacy ordering system**    Trastuzumab (Maintenance - NO Loading Dose):   A cycle is every 21 days:     Trastuzumab (Herceptin(R)) 6 mg/kg in 250 mL NS IV over 90 minutes.  If tolerated, maintenance dose may be given over 30 mins. Recommended Monitoring: LVEF q3-4 months for adjuvant  treatment, or with the development of cardiac symptoms and regimen  changes for metastatic treatment. Dose Mod: None  **Always confirm dose/schedule in your pharmacy ordering system**    Patient Characteristics: Neoadjuvant Chemotherapy, HER2/neu Positive AJCC Stage Grouping: IIA Current Disease Status: No Distant Mets or Local Recurrence AJCC M Stage: 0 ER Status: Positive (+) AJCC N Stage: 0 AJCC T Stage: 2 HER2/neu: Positive (+) PR Status: Negative (-)  Intent of Therapy: Curative Intent, Discussed with Patient

## 2016-03-01 NOTE — Patient Instructions (Signed)

## 2016-03-01 NOTE — Progress Notes (Signed)
[facility] NOTE  Patient Care Team: Tobe Sos, MD as PCP - General (Internal Medicine) Viona Gilmore Evette Cristal, MD as Consulting Physician (Obstetrics and Gynecology) [name], MD as Consulting Physician (General Surgery) Nicholas Lose, MD as Consulting Physician (Hematology and Oncology) Kyung Rudd, MD as Consulting Physician (Radiation Oncology)  CHIEF COMPLAINTS/PURPOSE OF CONSULTATION:  Newly diagnosed breast cancer  HISTORY OF PRESENTING ILLNESS:  Brenda Harding 60 y.o. female is here because of recent diagnosis of right breast cancer. Patient had palpated a lump in the right breast about 2 weeks ago. She immediately obtained a mammogram and ultrasound that revealed a 2.5 cm mass in the right breast. There was a smaller satellite lesion next to it as well. Both of these were biopsied. 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 She was presented this morning in the multidisciplinary tumor board and she is here today to discuss a treatment plan accompanied by her husband. Her daughter works as a occupational and physical therapist who is not here today.   I reviewed her records extensively and collaborated the history with the patient.  SUMMARY OF ONCOLOGIC HISTORY:   Malignant neoplasm of upper-outer quadrant of right female breast ([place])   02/24/2016 Initial Diagnosis    Right breast biopsy 1:00, grade 2-3 IDC, ER 5%, PR 0%, Ki-67 90%, HER-2 positive ratio 7.28, right breast biopsy 1:00 1.5 cm medial to the dominant mass fibrocystic changes; ultrasound and mammogram 02/23/2016: 2.5 cm mass at 1:00 position and a small 71m irregular mass 1.5 cm medial to the larger mass, T2 N0 stage II a clinical stage      MEDICAL HISTORY:  Past Medical History:  Diagnosis Date  . Anxiety   . Arthritis   . Hypertension   . Malignant neoplasm of upper-outer quadrant of right female breast ([place] 02/25/2016     SURGICAL HISTORY: Past Surgical History:  Procedure Laterality Date  . CARPAL TUNNEL RELEASE      SOCIAL HISTORY: Social History   Social History  . Marital status: Married    Spouse name: N/A  . Number of children: N/A  . Years of education: N/A   Occupational History  . Not on file.   Social History Main Topics  . Smoking status: Never Smoker  . Smokeless tobacco: Never Used  . Alcohol use No  . Drug use: No  . Sexual activity: Not on file   Other Topics Concern  . Not on file   Social History Narrative  . No narrative on file    FAMILY HISTORY: Family History  Problem Relation Age of Onset  . Breast cancer Maternal Aunt   . Breast cancer Cousin   . Colon cancer Cousin     ALLERGIES:  is allergic to propoxyphene.  MEDICATIONS:  Current Outpatient Prescriptions  Medication Sig Dispense Refill  . ALPRAZolam (XANAX) 0.5 MG tablet     . betamethasone dipropionate (DIPROLENE) 0.05 % cream Apply topically 3 (three) times a week.    .Marland KitchenBIOTIN PO Take by mouth daily.    . Calcium Carbonate Antacid (TUMS PO) Take 2 tablets by mouth at bedtime.    . Cholecalciferol (VITAMIN D3 PO) Take 5,000 mg by mouth once a week.    .Marland KitchenGARLIC PO Take by mouth daily.    .Marland Kitchenibuprofen (ADVIL,MOTRIN) 800 MG tablet     . montelukast (SINGULAIR) 10 MG tablet     .  PREMARIN vaginal cream     . spironolactone (ALDACTONE) 50 MG tablet     . verapamil (VERELAN PM) 240 MG 24 hr capsule 240 mg.     No current facility-administered medications for this visit.     REVIEW OF SYSTEMS:   Constitutional: Denies fevers, chills or abnormal night sweats Eyes: Denies blurriness of vision, double vision or watery eyes Ears, nose, mouth, throat, and face: Denies mucositis or sore throat Respiratory: Denies cough, dyspnea or wheezes Cardiovascular: Denies palpitation, chest discomfort or lower extremity swelling Gastrointestinal:  Denies nausea, heartburn or change in bowel habits Skin: Denies  abnormal skin rashes Lymphatics: Denies new lymphadenopathy or easy bruising Neurological:Denies numbness, tingling or new weaknesses Behavioral/Psych: Mood is stable, no new changes  Breast: Palpable lump in the right breast upper inner quadrant  All other systems were reviewed with the patient and are negative.  PHYSICAL EXAMINATION: ECOG PERFORMANCE STATUS: 0 - Asymptomatic  Vitals:   03/01/16 1238  BP: (!) 165/88  Pulse: 96  Resp: 18  Temp: 98.6 F (37 C)   Filed Weights   03/01/16 1238  Weight: 175 lb 4.8 oz (79.5 kg)    GENERAL:alert, no distress and comfortable SKIN: skin color, texture, turgor are normal, no rashes or significant lesions EYES: normal, conjunctiva are pink and non-injected, sclera clear OROPHARYNX:no exudate, no erythema and lips, buccal mucosa, and tongue normal  NECK: supple, thyroid normal size, non-tender, without nodularity LYMPH:  no palpable lymphadenopathy in the cervical, axillary or inguinal LUNGS: clear to auscultation and percussion with normal breathing effort HEART: regular rate & rhythm and no murmurs and no lower extremity edema ABDOMEN:abdomen soft, non-tender and normal bowel sounds Musculoskeletal:no cyanosis of digits and no clubbing  PSYCH: alert & oriented x 3 with fluent speech NEURO: no focal motor/sensory deficits BREAST:Large palpable mass in the right breast. No palpable axillary or supraclavicular lymphadenopathy (exam performed in the presence of a chaperone)   LABORATORY DATA:  I have reviewed the data as listed Lab Results  Component Value Date   WBC 5.0 03/01/2016   HGB 14.8 03/01/2016   HCT 45.3 03/01/2016   MCV 92.7 03/01/2016   PLT 255 03/01/2016   Lab Results  Component Value Date   NA 141 03/01/2016   K 4.0 03/01/2016   CO2 25 03/01/2016    RADIOGRAPHIC STUDIES: I have personally reviewed the radiological reports and agreed with the findings in the report.  ASSESSMENT AND PLAN:  Malignant neoplasm  of upper-outer quadrant of right female breast ([place]) Right breast biopsy 1:00, grade 2-3 IDC, ER 5%, PR 0%, Ki-67 90%, HER-2 positive ratio 7.28, right breast biopsy 1:00 1.5 cm medial to the dominant mass fibrocystic changes; ultrasound and mammogram 02/23/2016: 2.5 cm mass at 1:00 position and a small 56m irregular mass 1.5 cm medial to the larger mass, T2 N0 stage II a clinical stage  Pathology and radiology counseling: Discussed with the patient, the details of pathology including the type of breast cancer,the clinical staging, the significance of ER, PR and HER-2/neu receptors and the implications for treatment. After reviewing the pathology in detail, we proceeded to discuss the different treatment options between surgery, radiation, chemotherapy, antiestrogen therapies.  Recommendation based on multidisciplinary tumor board: 1. Neoadjuvant chemotherapy with TCH Perjeta 6 cycles followed by Herceptin maintenance for 1 year 2. Followed by breast conserving surgery if possible with sentinel lymph node study 3. Followed by adjuvant radiation therapy if patient had lumpectomy  Chemotherapy Counseling: I discussed the risks and  benefits of chemotherapy including the risks of nausea/ vomiting, risk of infection from low WBC count, fatigue due to chemo or anemia, bruising or bleeding due to low platelets, mouth sores, loss/ change in taste and decreased appetite. Liver and kidney function will be monitored through out chemotherapy as abnormalities in liver and kidney function may be a side effect of treatment. Cardiac dysfunction due to Herceptin and Perjeta were discussed in detail. Risk of permanent bone marrow dysfunction due to chemo were also discussed.  Plan: 1. Port placement 2. Echocardiogram 3. Chemotherapy class 4. Breast MRI  Return to clinic in 2 weeks to start chemotherapy.    All questions were answered. The patient knows to call the clinic with any problems, questions or  concerns.    Rulon Eisenmenger, MD 03/01/16

## 2016-03-01 NOTE — Progress Notes (Signed)
Nutrition Assessment  Reason for Assessment:  Pt seen in Breast Clinic  ASSESSMENT:  60 year old female with diagnosis of right breast cancer.  Past medical history of arthritis, HTN  Pt reports normal intake prior to visit.  Medications:  Vit D3, garlic  Labs: reviewed  Anthropometrics:   Wt: 175 lb 4.8 oz  Stable wt prior to visit.  NUTRITION DIAGNOSIS: Food and Nutrition Related knowledge Deficit related to new diagnosis of breast cancer as evidenced by no prior need for nutrition related information.  INTERVENTION:   Discussed and provided packet regarding nutritional tips for breast cancer patients. Questions answered. Teach back method used.   MONITORING, EVALUATION, and GOAL: Pt will consume a healthy plant based diet to maintain lean body mass throughout treatment.   Brenda Harding B. Zenia Resides, [place], [place] (pager)

## 2016-03-02 ENCOUNTER — Telehealth: Payer: Self-pay | Admitting: *Deleted

## 2016-03-02 ENCOUNTER — Encounter: Payer: Self-pay | Admitting: Hematology and Oncology

## 2016-03-02 NOTE — Progress Notes (Signed)
Pt called wanting to know the price of her chemotherapy.  I informed her I wouldn't have that info until she receives it and I can see it in her billing.  I offered to give her the pharmacy's number to see if they can answer that question for her but she declined stating she was going to call he ins company.  I also informed her of PAF and offered to apply in her behalf to see if she will qualify for that grant.  She will provide me with her household income once she gets closer to the start of treatment.

## 2016-03-02 NOTE — Telephone Encounter (Signed)
Spoke to pt concerning [facility] from 05/12/15. Denies questions or concerns regarding dx or treatment care plan. Encourage pt to call with needs. Received verbal understanding. Contact information provided. 

## 2016-03-04 ENCOUNTER — Ambulatory Visit (HOSPITAL_COMMUNITY)
Admission: RE | Admit: 2016-03-04 | Discharge: 2016-03-04 | Disposition: A | Discharge status: Home / Self Care | Payer: Medicare FFS | Source: Ambulatory Visit | Attending: Hematology and Oncology | Admitting: Hematology and Oncology

## 2016-03-04 DIAGNOSIS — K769 Liver disease, unspecified: Secondary | ICD-10-CM | POA: Diagnosis not present

## 2016-03-04 DIAGNOSIS — Z170 Estrogen receptor positive status [ER+]: Secondary | ICD-10-CM | POA: Insufficient documentation

## 2016-03-04 DIAGNOSIS — C50411 Malignant neoplasm of upper-outer quadrant of right female breast: Secondary | ICD-10-CM | POA: Diagnosis present

## 2016-03-04 MED ORDER — GADOBENATE DIMEGLUMINE 529 MG/ML IV SOLN
16.0000 mL | Freq: Once | INTRAVENOUS | Status: AC | PRN
  Administered 2016-03-04: 16 mL via INTRAVENOUS

## 2016-03-06 ENCOUNTER — Encounter: Payer: Self-pay | Admitting: *Deleted

## 2016-03-07 ENCOUNTER — Ambulatory Visit (HOSPITAL_COMMUNITY)
Admission: RE | Admit: 2016-03-07 | Discharge: 2016-03-07 | Disposition: A | Discharge status: Home / Self Care | Payer: Medicare FFS | Source: Ambulatory Visit | Attending: Hematology and Oncology | Admitting: Hematology and Oncology

## 2016-03-07 ENCOUNTER — Other Ambulatory Visit: Payer: Medicare FFS

## 2016-03-07 ENCOUNTER — Encounter: Payer: Self-pay | Admitting: *Deleted

## 2016-03-07 DIAGNOSIS — C50411 Malignant neoplasm of upper-outer quadrant of right female breast: Secondary | ICD-10-CM | POA: Insufficient documentation

## 2016-03-07 DIAGNOSIS — Z01810 Encounter for preprocedural cardiovascular examination: Secondary | ICD-10-CM | POA: Diagnosis not present

## 2016-03-07 DIAGNOSIS — Z170 Estrogen receptor positive status [ER+]: Secondary | ICD-10-CM | POA: Insufficient documentation

## 2016-03-07 DIAGNOSIS — I371 Nonrheumatic pulmonary valve insufficiency: Secondary | ICD-10-CM | POA: Diagnosis not present

## 2016-03-07 DIAGNOSIS — I358 Other nonrheumatic aortic valve disorders: Secondary | ICD-10-CM | POA: Diagnosis not present

## 2016-03-07 DIAGNOSIS — I10 Essential (primary) hypertension: Secondary | ICD-10-CM | POA: Diagnosis not present

## 2016-03-07 NOTE — Progress Notes (Signed)
Echocardiogram 2D Echocardiogram has been performed.  Brenda Harding 03/07/2016, 12:06 PM

## 2016-03-08 ENCOUNTER — Telehealth: Payer: Self-pay

## 2016-03-08 NOTE — Telephone Encounter (Signed)
Received VM from pt stating she was wishing to discuss scratchy throat and sinus congestion in regards to starting first chemo in 2 weeks.  Called pt to discuss further.  Pt reports she is experiencing scratchy throat and sinus congestion which she often experiences this time of year.  Pt is asking if she should take prednisone as she read online this could help with her symptoms.  I explained that we do not recommend prednisone and usually treat symptoms such as this with OTC medications unless she is having known fevers or signs of infection.  Pt reports she has been taking mucinex and doing sinus rinses and these seem to be helping; however, she is concerned as she is going out of the country this Friday on a cruise.  Pt is worried she will develop sinus infection as she often does when these types of symptoms present and is wondering if she can take a 5 day supply of azithromycin she has at home on her cruise in case she develops infection.  I instructed I would review this with Dr. Jana Hakim as it is a unique case.  Per Dr. Jana Hakim, it is okay for pt to take azithromycin should she start to experience signs of infection.  Called pt back to discuss but unable to reach pt.  Left VM with this information and also instructions to push lots of fluids during this time.  Encouraged pt to contact us with any additional questions or concerns, should they arise.

## 2016-03-09 ENCOUNTER — Telehealth: Payer: Self-pay

## 2016-03-09 NOTE — Telephone Encounter (Signed)
Patient called back today. Patient PCP started her on Breo for 14 days. Patient would like to know if this medication is okay to take.

## 2016-03-09 NOTE — Progress Notes (Signed)
ONCBCN DISTRESS SCREENING 03/09/2016  Screening Type Initial Screening  Distress experienced in past week (1-10) 5  Emotional problem type Nervousness/Anxiety;Adjusting to illness  Physical Problem type Sleep/insomnia   Support Center member did not meet with the patient directly during Clinic but [place] were given to the patient.   Rosana Fret, Counseling Intern Supervisor: Lorrin Jackson, lead Chaplain

## 2016-03-09 NOTE — Telephone Encounter (Signed)
Called pt back regarding question on new Breo medication her pcp started her on this week. Pt ok to take Breo, along with OTC medication to help with sinus congestion. Pt states that she discussed about taking antibiotic with nurse previously to prevent sinus infection. Pt states "I get sinus infections 3 x a year and I usually know my body when I need it, so I went ahead and got an antibiotic for it." No further concerns at this time.

## 2016-03-14 NOTE — H&P (Signed)
Brenda Harding 03/01/2016 8:18 AM Location: [facility] Patient #: [id number] DOB: 14-Jan-1956 Undefined / Language: Brenda Harding / Race: White Female   History of Present Illness Brenda Klein MD; 03/01/2016 4:27 PM) The patient is a 60 year Harding female who presents with breast cancer. patient is a 60 year Harding female referred by Dr. Morrison Harding For new diagnosis of right breast cancer. The patient has been getting screening mammograms annually. She had a normal screening mammogram in January of this year. In July at her primary care appointment, she did not have a breast mass noted on exam. However, the patient was working at her desk one day and for some reason was brushing her hand across her upper chest. She palpated a mass in her right upper breast. She had 2.5 cm mass seen on diagnostic imaging at 1:00 on the right breast. Core needle biopsy was performed which showed grade 3 invasive ductal carcinoma. This was weakly ER positive at 5%. PR was negative and HER-2/neu was amplified at a ratio of 7.28. Ki-67 was 90%. There was no axillary adenopathy seen on mammography or ultrasound. The other breast was clear.  The patient has a maternal aunt with breast cancer, a paternal first cousin with breast cancer diagnosed in her 34s, and a first cousin on the maternal side with colon cancer. Her mother has lived to be 6 so far.   The patient has severe scoliosis and is on disability for that. She had rods placed at age 6 and one of them is broken at this time. 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 She is a nonsmoker and does not use alcohol or illicit drugs. She had menarche at age 78. She has undergone menopause at age 53. She used Premarin vaginal cream for a year but no other hormone replacement. She is G2 P2 with her first birth at 11. She used oral contraceptive pills for a total of around 40 years.   She is up-to-date with her colonoscopy with the last one being in 2014. She had a bone density  study in 2016. She has had a Pap smear within the last 1-2 years.  dx mammogram and ultrasound 10/25 Targeted ultrasound is performed, showing an irregularly marginated hypoechoic mass located within the right breast at the 1 o'clock position 5 cm from the nipple with central calcifications. This measures 2.5 x 2.0 x 1.8 cm in size and corresponds to the irregular mass seen on mammography. In addition, there is a smaller irregular hypoechoic mass located 1.5 cm medial and superior to the larger mass. This measures 3 x 3 x 3 mm in size.  Ultrasound of the right axilla demonstrates normal axillary contents and no evidence for adenopathy.  Ultrasound-guided core biopsy of the 2.5 cm mass located at the 1 o'clock position is recommended as well as ultrasound-guided core biopsy of small (3 mm) irregular mass located 1.5 cm medial to the larger mass.  IMPRESSION: 2.5 cm regular mass located within the right breast at the 1 o'clock position 5 cm from the nipple with a smaller (3 mm) adjacent irregular mass located 1.5 cm medial to the larger mass. Ultrasound-guided core biopsy of each mass is recommended.  Pathology 1. Breast, right, needle core biopsy, 1:00 o'clock, 5cm from nipple - INVASIVE DUCTAL CARCINOMA. 2. Breast, right, needle core biopsy, 1:00 o'clock, 1.5 cm medial and superior to dominant mass - FIBROCYSTIC CHANGES. - NO EVIDENCE OF MALIGNANCY.  Microscopic Comment 1. The findings are consistent with grade 2/3 invasive  ductal carcinoma. Breast prognostic profile will be performed. Estrogen Receptor: 5%, POSITIVE, WEAK STAINING INTENSITY Progesterone Receptor: 0%, NEGATIVE Proliferation Marker Ki67: 90%  Results: HER2 - **POSITIVE** RATIO OF HER2/CEP17 SIGNALS 7.28 AVERAGE HER2 COPY NUMBER PER CELL 10.55  CMET/CBC today essentially normal.    Other Problems Brenda Pummel, RN; 03/01/2016 8:19 AM) Anxiety Disorder Arthritis Back Pain Breast  Cancer Gastroesophageal Reflux Disease Hemorrhoids High blood pressure Hypercholesterolemia Lump In Breast Other disease, cancer, significant illness Transfusion history  Past Surgical History Brenda Pummel, RN; 03/01/2016 8:19 AM) Breast Biopsy Right. Oral Surgery Spinal Surgery Midback  Diagnostic Studies History Brenda Pummel, RN; 03/01/2016 8:19 AM) Colonoscopy 1-5 years ago Mammogram within last year Pap Smear 1-5 years ago  Medication History Brenda Pummel, RN; 03/01/2016 8:20 AM) Medications Reconciled  Social History Brenda Pummel, RN; 03/01/2016 8:19 AM) Caffeine use Coffee, Tea. No alcohol use Tobacco use Never smoker.  Family History Brenda Pummel, RN; 03/01/2016 8:19 AM) Arthritis Brother, Family Members In General, Mother, Sister. Breast Cancer Family Members In General, Mother, Sister. Cancer Family Members In General. Cerebrovascular Accident Family Members In General, Father. Cervical Cancer Family Members In General. Colon Cancer Family Members In General. Colon Polyps Family Members In General. Heart Disease Sister. Hypertension Brother, Daughter, Family Members In [place], Father, Mother, Sister. Ovarian Cancer Family Members In General.  Pregnancy / Birth History Brenda Pummel, RN; 03/01/2016 8:19 AM) Age at menarche 25 years. Age of menopause 62-60 Contraceptive History Oral contraceptives. Gravida 2 Irregular periods Maternal age 83-25 Para 2    Review of Systems Brenda Klein MD; 03/01/2016 4:27 PM) General Present- Fatigue. Not Present- Appetite Loss, Chills, Fever, Night Sweats, Weight Gain and Weight Loss. Skin Present- Dryness. Not Present- Change in Wart/Mole, Hives, Jaundice, New Lesions, Non-Healing Wounds, Rash and Ulcer. HEENT Present- Hoarseness, Ringing in the Ears, Seasonal Allergies, Sinus Pain and Wears glasses/contact lenses. Not Present- Earache, Hearing Loss, Nose Bleed, Oral Ulcers,  Sore Throat, Visual Disturbances and Yellow Eyes. Respiratory Present- Difficulty Breathing and Snoring. Not Present- Bloody sputum, Chronic Cough and Wheezing. Breast Present- Breast Mass. Not Present- Breast Pain, Nipple Discharge and Skin Changes. Gastrointestinal Present- Indigestion. Not Present- Abdominal Pain, Bloating, Bloody Stool, Change in Bowel Habits, Chronic diarrhea, Constipation, Difficulty Swallowing, Excessive gas, Gets full quickly at meals, Hemorrhoids, Nausea, Rectal Pain and Vomiting. Female Genitourinary Not Present- Frequency, Nocturia, Painful Urination, Pelvic Pain and Urgency. Musculoskeletal Present- Back Pain, Joint Pain, Joint Stiffness and Swelling of Extremities. Not Present- Muscle Pain and Muscle Weakness. Neurological Not Present- Decreased Memory, Fainting, Headaches, Numbness, Seizures, Tingling, Tremor, Trouble walking and Weakness. Psychiatric Present- Anxiety and Change in Sleep Pattern. Not Present- Bipolar, Depression, Fearful and Frequent crying. Endocrine Not Present- Cold Intolerance, Excessive Hunger, Hair Changes, Heat Intolerance, Hot flashes and New Diabetes. Hematology Present- Blood Thinners and Easy Bruising. Not Present- Excessive bleeding, Gland problems, HIV and Persistent Infections. All other systems negative  Vitals Addendum Note(Tanzie Rothschild MD; 03/01/2016 4:37 PM) T 98.6 P 96 RR 18 BP 165/88 Wt 175.3 Ht 5' 1.5"     Physical Exam Brenda Klein MD; 03/01/2016 4:29 PM) General Mental Status-Alert. General Appearance-Consistent with stated age. Hydration-Well hydrated. Voice-Normal.  Head and Neck Head-normocephalic, atraumatic with no lesions or palpable masses. Trachea-midline. Thyroid Gland Characteristics - normal size and consistency.  Eye Eyeball - Bilateral-Extraocular movements intact. Sclera/Conjunctiva - Bilateral-No scleral icterus.  Chest and Lung Exam Chest and lung exam reveals -quiet,  even and easy respiratory effort with no use of accessory muscles and on auscultation,  normal breath sounds, no adventitious sounds and normal vocal resonance. Inspection Chest Wall - Normal. Back - normal.  Breast Note: Right breast - palpable mass at 1 o'clock. 2-3 cm, mobile. significant resolving bruising on superior breast. No palpable adenopathy, nipple retraction, skin dimpling. no nipple discharge. Left breast - heterogeneously dense. no masses, skin dimpling, nipple discharge, or nipple retraction. no adenopathy   Cardiovascular Cardiovascular examination reveals -normal heart sounds, regular rate and rhythm with no murmurs and normal pedal pulses bilaterally.  Abdomen Inspection Inspection of the abdomen reveals - No Hernias. Palpation/Percussion Palpation and Percussion of the abdomen reveal - Soft, Non Tender, No Rebound tenderness, No Rigidity (guarding) and No hepatosplenomegaly. Auscultation Auscultation of the abdomen reveals - Bowel sounds normal.  Neurologic Neurologic evaluation reveals -alert and oriented x 3 with no impairment of recent or remote memory. Mental Status-Normal.  Musculoskeletal Global Assessment -Note: no gross deformities.  Normal Exam - Left-Upper Extremity Strength Normal and Lower Extremity Strength Normal. Normal Exam - Right-Upper Extremity Strength Normal and Lower Extremity Strength Normal.  Lymphatic Head & Neck  General Head & Neck Lymphatics: Bilateral - Description - Normal. Axillary  General Axillary Region: Bilateral - Description - Normal. Tenderness - Non Tender. Femoral & Inguinal  Generalized Femoral & Inguinal Lymphatics: Bilateral - Description - No Generalized lymphadenopathy.    Assessment & Plan Brenda Klein MD; 03/01/2016 4:35 PM) BREAST CANCER OF UPPER-INNER QUADRANT OF RIGHT FEMALE BREAST (C50.211) Impression: Patient has a new diagnosis of clinical T1bN0 right breast cancer. Because of the size  of the tumor and the prognostic panel results, we will plan to treat her with neoadjuvant chemotherapy. This will allow [place] to treat her systemically and assess the efficacy of the treatment based on the breast mass. Also, it may have her be down staged on her final pathology. She will get an MRI as well as pre-chemotherapy testing. I will place a Port-A-Cath. Following chemotherapy, she will likely undergo lumpectomy and sentinel node biopsy, barring unforeseen findings on her MRI. After that, she will be recommended to receive adjuvant radiation and aromatase inhibitors followed by completion of a year of Herceptin.  I discussed Port-A-Cath placement with the patient. I showed her a Port-A-Cath model as well as with the tubing looks like when she'll be getting chemotherapy. We reviewed diagrams of anatomy. I discussed risks of port placement including but not limited to pneumothorax, bleeding, malfunction, blood clot, catheter breakage. I discussed the timeline of this occurring. She has a vacation scheduled next week. We will schedule the port as soon as she gets back from vacation. She will start chemotherapy at that point.  I discussed that I would see her at the midpoint of her chemotherapy after her midpoint MRI. We would get a general idea of the timing of surgery and hopefully solidify the surgical plan at that time point. She is in agreement with the plan. Current Plans Pt Education - [facility] breast cancer surgery: discussed with patient and provided information. You are being scheduled for surgery - Our schedulers will call you.  You should hear from our office's scheduling department within 5 working days about the location, date, and time of surgery. We try to make accommodations for patient's preferences in scheduling surgery, but sometimes the OR schedule or the surgeon's schedule prevents [place] from making those accommodations.  If you have not heard from our office [phone]) in 5 working  days, call the office and ask for your surgeon's nurse.  If you have other  questions about your diagnosis, plan, or surgery, call the office and ask for your surgeon's nurse.  Pt Education - [facility] port insertion education   Signed by Brenda Klein, MD (03/01/2016 4:36 PM)

## 2016-03-17 NOTE — Pre-Procedure Instructions (Signed)
Brenda Harding  03/17/2016      [facility] Brenda Harding, Brenda Harding [address] [name] [address] Phone: [phone] Fax: [phone]    Your procedure is scheduled on Tuesday November 21.  Report to [facility] Admitting at 9:50 A.M.  Call this number if you have problems the morning of surgery:  [phone]   Remember:  Do not eat food or drink liquids after midnight.  Take these medicines the morning of surgery with A SIP OF WATER: lansoprazole (Prevacid), mucinex if needed, tylenol if needed, alprazolam (Xanax) if needed, albuterol inhaler if needed (please bring to hospital with you)  7 days prior to surgery STOP taking any Aspirin, Aleve, Naproxen, Ibuprofen, Motrin, Advil, Goody's, BC's, all herbal medications, fish oil, and all vitamins    Do not wear jewelry, make-up or nail polish.  Do not wear lotions, powders, or perfumes, or deoderant.  Do not shave 48 hours prior to surgery.  Men may shave face and neck.  Do not bring valuables to the hospital.  [facility] is not responsible for any belongings or valuables.  Contacts, dentures or bridgework may not be worn into surgery.  Leave your suitcase in the car.  After surgery it may be brought to your room.  For patients admitted to the hospital, discharge time will be determined by your treatment team.  Patients discharged the day of surgery will not be allowed to drive home.   Special instructions:    [facility]- Preparing For Surgery  Before surgery, you can play an important role. Because skin is not sterile, your skin needs to be as free of germs as possible. You can reduce the number of germs on your skin by washing with CHG (chlorahexidine gluconate) Soap before surgery.  CHG is an antiseptic cleaner which kills germs and bonds with the skin to continue killing germs even after washing.  Please do not use if you have an allergy to CHG or antibacterial soaps. If your  skin becomes reddened/irritated stop using the CHG.  Do not shave (including legs and underarms) for at least 48 hours prior to first CHG shower. It is OK to shave your face.  Please follow these instructions carefully.   1. Shower the NIGHT BEFORE SURGERY and the MORNING OF SURGERY with CHG.   2. If you chose to wash your hair, wash your hair first as usual with your normal shampoo.  3. After you shampoo, rinse your hair and body thoroughly to remove the shampoo.  4. Use CHG as you would any other liquid soap. You can apply CHG directly to the skin and wash gently with a scrungie or a clean washcloth.   5. Apply the CHG Soap to your body ONLY FROM THE NECK DOWN.  Do not use on open wounds or open sores. Avoid contact with your eyes, ears, mouth and genitals (private parts). Wash genitals (private parts) with your normal soap.  6. Wash thoroughly, paying special attention to the area where your surgery will be performed.  7. Thoroughly rinse your body with warm water from the neck down.  8. DO NOT shower/wash with your normal soap after using and rinsing off the CHG Soap.  9. Pat yourself dry with a CLEAN TOWEL.   10. Wear CLEAN PAJAMAS   11. Place CLEAN SHEETS on your bed the night of your first shower and DO NOT SLEEP WITH PETS.    Day of Surgery: Do  not apply any deodorants/lotions. Please wear clean clothes to the hospital/surgery center.

## 2016-03-20 ENCOUNTER — Ambulatory Visit (HOSPITAL_COMMUNITY)
Admission: RE | Admit: 2016-03-20 | Discharge: 2016-03-20 | Disposition: A | Discharge status: Home / Self Care | Payer: Medicare FFS | Source: Ambulatory Visit | Attending: General Surgery | Admitting: General Surgery

## 2016-03-20 ENCOUNTER — Encounter (HOSPITAL_COMMUNITY): Payer: Self-pay

## 2016-03-20 ENCOUNTER — Encounter (HOSPITAL_COMMUNITY)
Admission: RE | Admit: 2016-03-20 | Discharge: 2016-03-20 | Disposition: A | Discharge status: Home / Self Care | Payer: Medicare FFS | Source: Ambulatory Visit | Attending: General Surgery | Admitting: General Surgery

## 2016-03-20 VITALS — BP 148/90 | HR 103 | Ht 65.0 in | Wt 175.0 lb

## 2016-03-20 DIAGNOSIS — I10 Essential (primary) hypertension: Secondary | ICD-10-CM | POA: Insufficient documentation

## 2016-03-20 DIAGNOSIS — C50411 Malignant neoplasm of upper-outer quadrant of right female breast: Secondary | ICD-10-CM

## 2016-03-20 DIAGNOSIS — Z79899 Other long term (current) drug therapy: Secondary | ICD-10-CM | POA: Insufficient documentation

## 2016-03-20 DIAGNOSIS — C50919 Malignant neoplasm of unspecified site of unspecified female breast: Secondary | ICD-10-CM | POA: Diagnosis not present

## 2016-03-20 DIAGNOSIS — F419 Anxiety disorder, unspecified: Secondary | ICD-10-CM | POA: Diagnosis not present

## 2016-03-20 DIAGNOSIS — Z170 Estrogen receptor positive status [ER+]: Secondary | ICD-10-CM | POA: Diagnosis not present

## 2016-03-20 HISTORY — DX: Headache: R51

## 2016-03-20 HISTORY — DX: Headache, unspecified: R51.9

## 2016-03-20 HISTORY — DX: Personal history of other specified conditions: Z87.898

## 2016-03-20 LAB — BASIC METABOLIC PANEL
Anion gap: 8 (ref 5–15)
BUN: 15 mg/dL (ref 6–20)
CO2: 27 mmol/L (ref 22–32)
Calcium: 10.1 mg/dL (ref 8.9–10.3)
Chloride: 104 mmol/L (ref 101–111)
Creatinine, Ser: 0.94 mg/dL (ref 0.44–1.00)
GFR calc Af Amer: >60 mL/min (ref >60)
GFR calc non Af Amer: >60 mL/min (ref >60)
Glucose, Bld: 88 mg/dL (ref 65–99)
Potassium: 3.5 mmol/L (ref 3.5–5.1)
Sodium: 139 mmol/L (ref 135–145)

## 2016-03-20 LAB — CBC
HCT: 45.2 % (ref 36.0–46.0)
Hemoglobin: 14.8 g/dL (ref 12.0–15.0)
MCH: 30.8 pg (ref 26.0–34.0)
MCHC: 32.7 g/dL (ref 30.0–36.0)
MCV: 94.2 fL (ref 78.0–100.0)
Platelets: 272 10*3/uL (ref 150–400)
RBC: 4.8 MIL/uL (ref 3.87–5.11)
RDW: 13.2 % (ref 11.5–15.5)
WBC: 6.4 10*3/uL (ref 4.0–10.5)

## 2016-03-20 NOTE — Patient Instructions (Signed)
Your physician has requested that you have an echocardiogram. Echocardiography is a painless test that uses sound waves to create images of your heart. It provides your doctor with information about the size and shape of your heart and how well your heart's chambers and valves are working. This procedure takes approximately one hour. There are no restrictions for this procedure.  Your physician recommends that you schedule a follow-up appointment in: 3 months with Dr McLean  

## 2016-03-20 NOTE — Assessment & Plan Note (Addendum)
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 Neoadjuvant chemotherapy with TCH Perjeta 6 cycles followed by Herceptin maintenance for 1 year 2. Followed by breast conserving surgery if possible with sentinel lymph node study 3. Followed by adjuvant radiation therapy if patient had lumpectomy --------------------------------------------------------------------------------------------------------------------------------------------------------------- Current Treatment: cycle 1 day 1 TCHP Antiemetics were reviewed Chemotherapy consent obtained Chemotherapy education completed Echocardiogram 03/07/2016: EF 60-65% Closely monitoring for chemotherapy toxicities. Return to clinic in one week for toxicity check

## 2016-03-20 NOTE — Progress Notes (Addendum)
PCP: Albin Fischer No cardiac workup per pt. Or cardiologist that she has seen. Pt to see cardiologist today for ECHO.   EKG: 03/20/16  Pt with recent sinus infection and wheezing which she takes albuterol for. Pt with no complaints of wheezing today. No productive cough or fever. Pt refuses CXR.   No chest pain, SOB at PAT appointment.

## 2016-03-20 NOTE — Progress Notes (Signed)
Oncology: Dr. Lindi Adie  60 yo with history of HTN and breast cancer presents for cardio-oncology evaluation.  She has been generally in good health.  She has HTN controlled by verapamil CR and spironolactone.  No exertional dyspnea or chest pain.    Breast cancer was diagnosed by biopsy in 10/17.  ER+/PR-/HER2+.  Plan for [facility] Perjeta chemo x 6 cycles, then Herceptin alone to complete 1 year of treatment.   ECG: NSR, normal.  PMH:  1. HTN 2. Hyperlipidemia: Diet-controlled.  3. Anxiety 4. Breast cancer: - echo (11/17): EF 60-65%, GLS -18.5%.   SH: Lives in [place], never smoked, rare ETOH.   FH: Sister with CABG at age 8.   ROS: All systems reviewed and negative except as per HPI.   Current Outpatient Prescriptions  Medication Sig Dispense Refill  . albuterol (PROVENTIL HFA;VENTOLIN HFA) 108 (90 Base) MCG/ACT inhaler Inhale 2 puffs into the lungs every 6 (six) hours as needed for wheezing or shortness of breath.    . ALPRAZolam (XANAX) 0.5 MG tablet Take 0.5 mg by mouth 2 (two) times daily as needed.     . betamethasone dipropionate (DIPROLENE) 0.05 % cream Apply topically 3 (three) times a week.    . Cholecalciferol (VITAMIN D3 PO) Take 2,500 mg by mouth 2 (two) times a week.     Marland Kitchen dexamethasone (DECADRON) 4 MG tablet Take 1 tablet (4 mg total) by mouth daily. Start the day before Taxotere. Then again the day after chemo for 1 day. 30 tablet 1  . GARLIC PO Take by mouth daily.    Marland Kitchen guaiFENesin (MUCINEX) 600 MG 12 hr tablet Take 600 mg by mouth 2 (two) times daily as needed.    Marland Kitchen ibuprofen (ADVIL,MOTRIN) 800 MG tablet 800 mg every 8 (eight) hours as needed for mild pain.     Marland Kitchen lansoprazole (PREVACID) 30 MG capsule Take 30 mg by mouth daily at 12 noon.    Marland Kitchen levocetirizine (XYZAL) 5 MG tablet Take 5 mg by mouth every evening.    . lidocaine-prilocaine (EMLA) cream Apply to affected area once 30 g 3  . LORazepam (ATIVAN) 0.5 MG tablet Take 1 tablet (0.5 mg total) by mouth at bedtime. 30  tablet 0  . montelukast (SINGULAIR) 10 MG tablet Take 10 mg by mouth at bedtime.     . ondansetron (ZOFRAN) 8 MG tablet Take 1 tablet (8 mg total) by mouth 2 (two) times daily as needed for refractory nausea / vomiting. Start on day 3 after chemo. 30 tablet 1  . PREMARIN vaginal cream Place 1 Applicatorful vaginally 2 (two) times a week.     . Probiotic Product (PROBIOTIC DAILY PO) Take 1 capsule by mouth 3 (three) times daily as needed. Varies on dosage - if on antibiotics will take up to 3 times daily, and if not on antibiotics, may take up to 1 or 2 daily    . prochlorperazine (COMPAZINE) 10 MG tablet Take 1 tablet (10 mg total) by mouth every 6 (six) hours as needed (Nausea or vomiting). 30 tablet 1  . spironolactone (ALDACTONE) 50 MG tablet Take 50 mg by mouth 2 (two) times daily.     . Triamcinolone Acetonide (NASACORT ALLERGY 24HR NA) Place 1 spray into the nose daily.    . verapamil (VERELAN PM) 240 MG 24 hr capsule 240 mg.     No current facility-administered medications for this encounter.    BP (!) 148/90 (BP Location: Right Arm, Patient Position: Sitting, Cuff Size: Normal)  Pulse (!) 103   Ht '5\' 5"'  (1.651 m)   Wt 175 lb (79.4 kg)   SpO2 98%   BMI 29.12 kg/m  General: NAD Neck: No JVD, no thyromegaly or thyroid nodule.  Lungs: Clear to auscultation bilaterally with normal respiratory effort. CV: Nondisplaced PMI.  Heart regular S1/S2, no S3/S4, no murmur.  No peripheral edema.  No carotid bruit.  Normal pedal pulses.  Abdomen: Soft, nontender, no hepatosplenomegaly, no distention.  Skin: Intact without lesions or rashes.  Neurologic: Alert and oriented x 3.  Psych: Normal affect. Extremities: No clubbing or cyanosis.  HEENT: Normal.   Assessment/Plan:  1. HTN: BP controlled on current regimen.  Suspect high today due to anxiety.  2. Breast cancer: Patient is going to start a chemotherapy regimen including Herceptin (this Wednesday).  Herceptin will continue 1 year.  We  discussed the cardiac risk from Herceptin and the rationale behind echo screening.  I reviewed the recent echo, EF and strain look ok.   - Repeat echo in 3 months with office visit.   Loralie Champagne 03/20/2016

## 2016-03-21 ENCOUNTER — Ambulatory Visit (HOSPITAL_COMMUNITY): Payer: Medicare FFS | Admitting: Certified Registered"

## 2016-03-21 ENCOUNTER — Ambulatory Visit (HOSPITAL_COMMUNITY)
Admission: RE | Admit: 2016-03-21 | Discharge: 2016-03-21 | Disposition: A | Discharge status: Home / Self Care | Payer: Medicare FFS | Source: Ambulatory Visit | Attending: General Surgery | Admitting: General Surgery

## 2016-03-21 ENCOUNTER — Ambulatory Visit (HOSPITAL_COMMUNITY): Payer: Medicare FFS

## 2016-03-21 ENCOUNTER — Encounter (HOSPITAL_COMMUNITY)
Admission: RE | Disposition: A | Discharge status: Home / Self Care | Payer: Self-pay | Source: Ambulatory Visit | Attending: General Surgery

## 2016-03-21 ENCOUNTER — Encounter (HOSPITAL_COMMUNITY): Payer: Self-pay | Admitting: Surgery

## 2016-03-21 ENCOUNTER — Encounter: Payer: Self-pay | Admitting: Pharmacist

## 2016-03-21 DIAGNOSIS — F419 Anxiety disorder, unspecified: Secondary | ICD-10-CM | POA: Diagnosis not present

## 2016-03-21 DIAGNOSIS — I10 Essential (primary) hypertension: Secondary | ICD-10-CM | POA: Insufficient documentation

## 2016-03-21 DIAGNOSIS — K219 Gastro-esophageal reflux disease without esophagitis: Secondary | ICD-10-CM | POA: Insufficient documentation

## 2016-03-21 DIAGNOSIS — C50211 Malignant neoplasm of upper-inner quadrant of right female breast: Secondary | ICD-10-CM | POA: Insufficient documentation

## 2016-03-21 DIAGNOSIS — E7800 Pure hypercholesterolemia, unspecified: Secondary | ICD-10-CM | POA: Insufficient documentation

## 2016-03-21 DIAGNOSIS — M1990 Unspecified osteoarthritis, unspecified site: Secondary | ICD-10-CM | POA: Insufficient documentation

## 2016-03-21 DIAGNOSIS — Z419 Encounter for procedure for purposes other than remedying health state, unspecified: Secondary | ICD-10-CM

## 2016-03-21 DIAGNOSIS — Z95828 Presence of other vascular implants and grafts: Secondary | ICD-10-CM

## 2016-03-21 HISTORY — PX: PORTACATH PLACEMENT: SHX2246

## 2016-03-21 SURGERY — INSERTION, TUNNELED CENTRAL VENOUS DEVICE, WITH PORT
Anesthesia: General | Site: Chest | Laterality: Left

## 2016-03-21 MED ORDER — MIDAZOLAM HCL 5 MG/5ML IJ SOLN
INTRAMUSCULAR | Status: DC | PRN
  Administered 2016-03-21: 2 mg via INTRAVENOUS

## 2016-03-21 MED ORDER — CHLORHEXIDINE GLUCONATE CLOTH 2 % EX PADS: 6.0000 | MEDICATED_PAD | Freq: Once | CUTANEOUS | Status: DC

## 2016-03-21 MED ORDER — OXYCODONE HCL 5 MG PO TABS
5.0000 mg | ORAL_TABLET | Freq: Once | ORAL | Status: AC | PRN
  Administered 2016-03-21: 5 mg via ORAL

## 2016-03-21 MED ORDER — ONDANSETRON HCL 4 MG/2ML IJ SOLN
INTRAMUSCULAR | Status: AC
Start: 2016-03-21 — End: 2016-03-21
  Filled 2016-03-21: qty 2

## 2016-03-21 MED ORDER — CEFAZOLIN SODIUM-DEXTROSE 2-4 GM/100ML-% IV SOLN
INTRAVENOUS | Status: AC
  Filled 2016-03-21: qty 100

## 2016-03-21 MED ORDER — 0.9 % SODIUM CHLORIDE (POUR BTL) OPTIME
TOPICAL | Status: DC | PRN
  Administered 2016-03-21: 1000 mL

## 2016-03-21 MED ORDER — MIDAZOLAM HCL 2 MG/2ML IJ SOLN
INTRAMUSCULAR | Status: AC
  Filled 2016-03-21: qty 2

## 2016-03-21 MED ORDER — HEPARIN SOD (PORK) LOCK FLUSH 100 UNIT/ML IV SOLN
INTRAVENOUS | Status: DC | PRN
  Administered 2016-03-21: 500 [IU]

## 2016-03-21 MED ORDER — LACTATED RINGERS IV SOLN
INTRAVENOUS | Status: DC | PRN
  Administered 2016-03-21: 12:00:00 via INTRAVENOUS

## 2016-03-21 MED ORDER — CEFAZOLIN SODIUM-DEXTROSE 2-4 GM/100ML-% IV SOLN
2.0000 g | INTRAVENOUS | Status: AC
  Administered 2016-03-21: 2 g via INTRAVENOUS

## 2016-03-21 MED ORDER — OXYCODONE HCL 5 MG/5ML PO SOLN: 5.0000 mg | Freq: Once | ORAL | Status: AC | PRN

## 2016-03-21 MED ORDER — PROPOFOL 10 MG/ML IV BOLUS
INTRAVENOUS | Status: DC | PRN
  Administered 2016-03-21: 180 mg via INTRAVENOUS

## 2016-03-21 MED ORDER — SODIUM CHLORIDE 0.9 % IV SOLN
INTRAVENOUS | Status: DC | PRN
  Administered 2016-03-21: 13:00:00

## 2016-03-21 MED ORDER — FENTANYL CITRATE (PF) 100 MCG/2ML IJ SOLN
INTRAMUSCULAR | Status: DC | PRN
  Administered 2016-03-21: 100 ug via INTRAVENOUS
  Administered 2016-03-21: 50 ug via INTRAVENOUS

## 2016-03-21 MED ORDER — LIDOCAINE 2% (20 MG/ML) 5 ML SYRINGE
INTRAMUSCULAR | Status: DC | PRN
  Administered 2016-03-21: 100 mg via INTRAVENOUS

## 2016-03-21 MED ORDER — OXYCODONE HCL 5 MG PO TABS: 5.0000 mg | ORAL_TABLET | Freq: Four times a day (QID) | ORAL | 0 refills | Status: DC | PRN

## 2016-03-21 MED ORDER — DEXAMETHASONE SODIUM PHOSPHATE 10 MG/ML IJ SOLN
INTRAMUSCULAR | Status: DC | PRN
  Administered 2016-03-21: 10 mg via INTRAVENOUS

## 2016-03-21 MED ORDER — BUPIVACAINE HCL (PF) 0.5 % IJ SOLN
INTRAMUSCULAR | Status: DC | PRN
  Administered 2016-03-21: 30 mL

## 2016-03-21 MED ORDER — LIDOCAINE 2% (20 MG/ML) 5 ML SYRINGE
INTRAMUSCULAR | Status: AC
  Filled 2016-03-21: qty 5

## 2016-03-21 MED ORDER — HEPARIN SOD (PORK) LOCK FLUSH 100 UNIT/ML IV SOLN
INTRAVENOUS | Status: AC
  Filled 2016-03-21: qty 5

## 2016-03-21 MED ORDER — FENTANYL CITRATE (PF) 100 MCG/2ML IJ SOLN
25.0000 ug | INTRAMUSCULAR | Status: DC | PRN
  Administered 2016-03-21: 25 ug via INTRAVENOUS

## 2016-03-21 MED ORDER — OXYCODONE HCL 5 MG PO TABS
ORAL_TABLET | ORAL | Status: AC
  Filled 2016-03-21: qty 1

## 2016-03-21 MED ORDER — OXYCODONE HCL 5 MG PO TABS: 5.0000 mg | ORAL_TABLET | ORAL | Status: DC | PRN

## 2016-03-21 MED ORDER — ONDANSETRON HCL 4 MG/2ML IJ SOLN
INTRAMUSCULAR | Status: DC | PRN
  Administered 2016-03-21: 4 mg via INTRAVENOUS

## 2016-03-21 MED ORDER — PROPOFOL 10 MG/ML IV BOLUS
INTRAVENOUS | Status: AC
  Filled 2016-03-21: qty 20

## 2016-03-21 MED ORDER — LIDOCAINE HCL (PF) 1 % IJ SOLN
INTRAMUSCULAR | Status: AC
  Filled 2016-03-21: qty 30

## 2016-03-21 MED ORDER — LIDOCAINE HCL (PF) 1 % IJ SOLN
INTRAMUSCULAR | Status: DC | PRN
  Administered 2016-03-21: 30 mL

## 2016-03-21 MED ORDER — BUPIVACAINE HCL (PF) 0.5 % IJ SOLN
INTRAMUSCULAR | Status: AC
  Filled 2016-03-21: qty 30

## 2016-03-21 MED ORDER — FENTANYL CITRATE (PF) 100 MCG/2ML IJ SOLN
INTRAMUSCULAR | Status: AC
  Filled 2016-03-21: qty 2

## 2016-03-21 MED ORDER — DEXAMETHASONE SODIUM PHOSPHATE 10 MG/ML IJ SOLN
INTRAMUSCULAR | Status: AC
  Filled 2016-03-21: qty 1

## 2016-03-21 MED ORDER — FENTANYL CITRATE (PF) 250 MCG/5ML IJ SOLN
INTRAMUSCULAR | Status: AC
  Filled 2016-03-21: qty 5

## 2016-03-21 SURGICAL SUPPLY — 49 items
BAG DECANTER FOR FLEXI CONT (MISCELLANEOUS) ×2 IMPLANT
BLADE SURG 11 STRL SS (BLADE) ×2 IMPLANT
BLADE SURG 15 STRL LF DISP TIS (BLADE) ×1 IMPLANT
BLADE SURG 15 STRL SS (BLADE) ×1
CANISTER SUCTION 2500CC (MISCELLANEOUS) IMPLANT
CHLORAPREP W/TINT 10.5 ML (MISCELLANEOUS) ×2 IMPLANT
COVER SURGICAL LIGHT HANDLE (MISCELLANEOUS) ×2 IMPLANT
COVER TRANSDUCER ULTRASND GEL (DRAPE) IMPLANT
CRADLE DONUT ADULT HEAD (MISCELLANEOUS) ×2 IMPLANT
DECANTER SPIKE VIAL GLASS SM (MISCELLANEOUS) ×4 IMPLANT
DERMABOND ADVANCED (GAUZE/BANDAGES/DRESSINGS)
DERMABOND ADVANCED .7 DNX12 (GAUZE/BANDAGES/DRESSINGS) IMPLANT
DRAPE C-ARM 42X72 X-RAY (DRAPES) ×2 IMPLANT
DRAPE CHEST BREAST 15X10 FENES (DRAPES) ×2 IMPLANT
DRAPE UTILITY XL STRL (DRAPES) ×4 IMPLANT
DRAPE WARM FLUID 44X44 (DRAPE) IMPLANT
DRSG TEGADERM 4X4.75 (GAUZE/BANDAGES/DRESSINGS) ×4 IMPLANT
ELECT COATED BLADE 2.86 ST (ELECTRODE) ×2 IMPLANT
ELECT REM PT RETURN 9FT ADLT (ELECTROSURGICAL) ×2
ELECTRODE REM PT RTRN 9FT ADLT (ELECTROSURGICAL) ×1 IMPLANT
GAUZE SPONGE 4X4 16PLY XRAY LF (GAUZE/BANDAGES/DRESSINGS) ×2 IMPLANT
GEL ULTRASOUND 20GR AQUASONIC (MISCELLANEOUS) IMPLANT
GLOVE BIO SURGEON STRL SZ 6 (GLOVE) ×2 IMPLANT
GLOVE BIOGEL PI IND STRL 6.5 (GLOVE) ×1 IMPLANT
GLOVE BIOGEL PI INDICATOR 6.5 (GLOVE) ×1
GOWN STRL REUS W/ TWL LRG LVL3 (GOWN DISPOSABLE) ×2 IMPLANT
GOWN STRL REUS W/TWL 2XL LVL3 (GOWN DISPOSABLE) ×2 IMPLANT
GOWN STRL REUS W/TWL LRG LVL3 (GOWN DISPOSABLE) ×2
KIT BASIN OR (CUSTOM PROCEDURE TRAY) ×2 IMPLANT
KIT PORT POWER 8FR ISP CVUE (Catheter) ×2 IMPLANT
KIT ROOM TURNOVER OR (KITS) ×2 IMPLANT
NEEDLE HYPO 25GX1X1/2 BEV (NEEDLE) ×2 IMPLANT
NS IRRIG 1000ML POUR BTL (IV SOLUTION) ×2 IMPLANT
PACK GENERAL/GYN (CUSTOM PROCEDURE TRAY) ×2 IMPLANT
PACK SURGICAL SETUP 50X90 (CUSTOM PROCEDURE TRAY) IMPLANT
PAD ARMBOARD 7.5X6 YLW CONV (MISCELLANEOUS) ×2 IMPLANT
PENCIL BUTTON HOLSTER BLD 10FT (ELECTRODE) IMPLANT
SPONGE GAUZE 4X4 12PLY STER LF (GAUZE/BANDAGES/DRESSINGS) ×2 IMPLANT
SUT MON AB 4-0 PC3 18 (SUTURE) ×2 IMPLANT
SUT PROLENE 2 0 SH DA (SUTURE) ×4 IMPLANT
SUT VIC AB 3-0 SH 27 (SUTURE) ×1
SUT VIC AB 3-0 SH 27X BRD (SUTURE) ×1 IMPLANT
SYR 20ML ECCENTRIC (SYRINGE) ×4 IMPLANT
SYR 5ML LUER SLIP (SYRINGE) ×2 IMPLANT
SYR CONTROL 10ML LL (SYRINGE) ×2 IMPLANT
TOWEL OR 17X24 6PK STRL BLUE (TOWEL DISPOSABLE) ×2 IMPLANT
TOWEL OR 17X26 10 PK STRL BLUE (TOWEL DISPOSABLE) IMPLANT
TUBE CONNECTING 12X1/4 (SUCTIONS) IMPLANT
YANKAUER SUCT BULB TIP NO VENT (SUCTIONS) IMPLANT

## 2016-03-21 NOTE — Op Note (Signed)
PREOPERATIVE DIAGNOSIS:  Right breast cancer upper outer quadrant     POSTOPERATIVE DIAGNOSIS:  Same     PROCEDURE: Left subclavian port placement, Bard ClearVue  Power Port, MRI safe, 8-French.      SURGEON:  [name], MD      ANESTHESIA:  General   FINDINGS:  Good venous return, easy flush, and tip of the catheter and   SVC 24 cm.      SPECIMEN:  None.      ESTIMATED BLOOD LOSS:  Minimal.      COMPLICATIONS:  None known.      PROCEDURE:  Pt was identified in the holding area and taken to   the operating room, where patient was placed supine on the operating room   table.  General anesthesia was induced.  Patient's arms were tucked and the upper   chest and neck were prepped and draped in sterile fashion.  Time-out was   performed according to the surgical safety check list.  When all was   correct, we continued.   Local anesthetic was administered over this   area at the angle of the clavicle.  The vein was accessed with 1 pass of the needle. There was good venous return and the wire passed easily with no ectopy.   Fluoroscopy was used to confirm that the wire was in the vena cava.      The patient was placed back level and the area for the pocket was anethetized   with local anesthetic.  A 3-cm transverse incision was made with a #15   blade.  Cautery was used to divide the subcutaneous tissues down to the   pectoralis muscle.  An [employer] retractor was used to elevate the skin   while a pocket was created on top of the pectoralis fascia.  The port   was placed into the pocket to confirm that it was of adequate size.  The   catheter was preattached to the port.  The port was then secured to the   pectoralis fascia with four 2-0 Prolene sutures.  These were clamped and   not tied down yet.    The catheter was tunneled through to the wire exit   site.  The catheter was placed along the wire to determine what length it should be to be in the SVC.  The catheter was cut at  24 cm.  The tunneler sheath and dilator were passed over the wire and the dilator and wire were removed.  The catheter was advanced through the tunneler sheath and the tunneler sheath was pulled away.  Care was taken to keep the catheter in the tunneler sheath as this occurred. This was advanced and the tunneler sheath was removed.  There was good venous   return and easy flush of the catheter.  The Prolene sutures were tied   down to the pectoral fascia.  The skin was reapproximated using 3-0   Vicryl interrupted deep dermal sutures.    Fluoroscopy was used to re-confirm good position of the catheter.  The skin   was then closed using 4-0 Monocryl in a subcuticular fashion.  The port was flushed with concentrated heparin flush as well.  The wounds were then cleaned, dried, and dressed with Dermabond.  The port was left accessed.  The patient was awakened from anesthesia and taken to the PACU in stable condition.  Needle, sponge, and instrument counts were correct.  [name], MD

## 2016-03-21 NOTE — Anesthesia Preprocedure Evaluation (Signed)
Anesthesia Evaluation  Patient identified by MRN, date of birth, ID band Patient awake    Reviewed: Allergy & Precautions, NPO status , Patient's Chart, lab work & pertinent test results  History of Anesthesia Complications Negative for: history of anesthetic complications  Airway Mallampati: II  TM Distance: >3 FB Neck ROM: Full    Dental  (+) Teeth Intact   Pulmonary neg pulmonary ROS,    breath sounds clear to auscultation       Cardiovascular hypertension, Pt. on medications  Rhythm:Regular     Neuro/Psych  Headaches, Anxiety    GI/Hepatic negative GI ROS, Neg liver ROS,   Endo/Other  negative endocrine ROS  Renal/GU negative Renal ROS     Musculoskeletal  (+) Arthritis ,   Abdominal   Peds  Hematology   Anesthesia Other Findings   Reproductive/Obstetrics                             Anesthesia Physical Anesthesia Plan  ASA: II  Anesthesia Plan: General   Post-op Pain Management:    Induction: Intravenous  Airway Management Planned: LMA  Additional Equipment: None  Intra-op Plan:   Post-operative Plan: Extubation in OR  Informed Consent: I have reviewed the patients History and Physical, chart, labs and discussed the procedure including the risks, benefits and alternatives for the proposed anesthesia with the patient or authorized representative who has indicated his/her understanding and acceptance.   Dental advisory given  Plan Discussed with: CRNA and Surgeon  Anesthesia Plan Comments:         Anesthesia Quick Evaluation

## 2016-03-21 NOTE — Discharge Instructions (Addendum)
[facility] °Office Phone Number [phone] ° ° POST OP INSTRUCTIONS ° °Always review your discharge instruction sheet given to you by the facility where your surgery was performed. ° °IF YOU HAVE DISABILITY OR FAMILY LEAVE FORMS, YOU MUST BRING THEM TO THE OFFICE FOR PROCESSING.  DO NOT GIVE THEM TO YOUR DOCTOR. ° °1. A prescription for pain medication may be given to you upon discharge.  Take your pain medication as prescribed, if needed.  If narcotic pain medicine is not needed, then you may take acetaminophen (Tylenol) or ibuprofen (Advil) as needed. °2. Take your usually prescribed medications unless otherwise directed °3. If you need a refill on your pain medication, please contact your pharmacy.  They will contact our office to request authorization.  Prescriptions will not be filled after 5pm or on week-ends. °4. You should eat very light the first 24 hours after surgery, such as soup, crackers, pudding, etc.  Resume your normal diet the day after surgery °5. It is common to experience some constipation if taking pain medication after surgery.  Increasing fluid intake and taking a stool softener will usually help or prevent this problem from occurring.  A mild laxative (Milk of Magnesia or Miralax) should be taken according to package directions if there are no bowel movements after 48 hours. °6. You may shower in 48 hours.  The surgical glue will flake off in 2-3 weeks.   °7. ACTIVITIES:  No strenuous activity or heavy lifting for 1 week.   °a. You may drive when you no longer are taking prescription pain medication, you can comfortably wear a seatbelt, and you can safely maneuver your car and apply brakes. °b. RETURN TO WORK:  __________to be determined._______________ °You should see your doctor in the office for a follow-up appointment approximately three-four weeks after your surgery.   ° °WHEN TO CALL YOUR DOCTOR: °1. Fever over 101.0 °2. Nausea and/or vomiting. °3. Extreme swelling  or bruising. °4. Continued bleeding from incision. °5. Increased pain, redness, or drainage from the incision. ° °The clinic staff is available to answer your questions during regular business hours.  Please don’t hesitate to call and ask to speak to one of the nurses for clinical concerns.  If you have a medical emergency, go to the nearest emergency room or call 911.  A surgeon from [facility] is always on call at the hospital. ° °For further questions, please visit [email or web address]  ° °

## 2016-03-21 NOTE — Interval H&P Note (Signed)
History and Physical Interval Note:  03/21/2016 1:09 PM  Brenda Harding  has presented today for surgery, with the diagnosis of RIGHT BREAST CANCER  The various methods of treatment have been discussed with the patient and family. After consideration of risks, benefits and other options for treatment, the patient has consented to  Procedure(s): INSERTION PORT-A-CATH WITH [place] (N/A) as a surgical intervention .  The patient's history has been reviewed, patient examined, no change in status, stable for surgery.  I have reviewed the patient's chart and labs.  Questions were answered to the patient's satisfaction.     [name]

## 2016-03-21 NOTE — Anesthesia Procedure Notes (Signed)
Procedure Name: Intubation Date/Time: 03/21/2016 12:32 PM Performed by: Noralyn Pick D Pre-anesthesia Checklist: Patient identified, Emergency Drugs available, Suction available and Patient being monitored Patient Re-evaluated:Patient Re-evaluated prior to inductionOxygen Delivery Method: Circle system utilized Preoxygenation: Pre-oxygenation with 100% oxygen Intubation Type: IV induction Ventilation: Mask ventilation without difficulty LMA: LMA inserted LMA Size: 3.0 Tube type: Oral Number of attempts: 1 Placement Confirmation: ETT inserted through vocal cords under direct vision,  positive ETCO2 and breath sounds checked- equal and bilateral Tube secured with: Tape Dental Injury: Teeth and Oropharynx as per pre-operative assessment

## 2016-03-21 NOTE — Anesthesia Postprocedure Evaluation (Signed)
Anesthesia Post Note  Patient: Brenda Harding  Procedure(s) Performed: Procedure(s) (LRB): INSERTION PORT-A-CATH (Left)  Patient location during evaluation: PACU Anesthesia Type: General Level of consciousness: awake Pain management: pain level controlled Vital Signs Assessment: post-procedure vital signs reviewed and stable Respiratory status: spontaneous breathing Cardiovascular status: stable Postop Assessment: no signs of nausea or vomiting Anesthetic complications: no    Last Vitals:  Vitals:   03/21/16 1348 03/21/16 1402  BP: (!) 127/91 (!) 141/90  Pulse: 92 78  Resp: 14 10  Temp:      Last Pain:  Vitals:   03/21/16 1345  TempSrc:   PainSc: 3                  Tifani Dack

## 2016-03-21 NOTE — Transfer of Care (Signed)
Immediate Anesthesia Transfer of Care Note  Patient: Brenda Harding  Procedure(s) Performed: Procedure(s): INSERTION PORT-A-CATH (Left)  Patient Location: PACU  Anesthesia Type:General  Level of Consciousness: awake, alert  and oriented  Airway & Oxygen Therapy: Patient Spontanous Breathing and Patient connected to nasal cannula oxygen  Post-op Assessment: Report given to RN and Post -op Vital signs reviewed and stable  Post vital signs: Reviewed and stable  Last Vitals:  Vitals:   03/21/16 1024 03/21/16 1317  BP: 137/89 137/89  Pulse: 83 93  Resp: 18 10  Temp: 36.9 C     Last Pain:  Vitals:   03/21/16 1024  TempSrc: Oral      Patients Stated Pain Goal: 3 ([id number])  Complications: No apparent anesthesia complications

## 2016-03-22 ENCOUNTER — Encounter (HOSPITAL_COMMUNITY): Payer: Self-pay | Admitting: General Surgery

## 2016-03-22 ENCOUNTER — Ambulatory Visit (HOSPITAL_BASED_OUTPATIENT_CLINIC_OR_DEPARTMENT_OTHER): Payer: Medicare FFS | Admitting: Hematology and Oncology

## 2016-03-22 ENCOUNTER — Encounter: Payer: Self-pay | Admitting: *Deleted

## 2016-03-22 ENCOUNTER — Other Ambulatory Visit: Payer: Medicare FFS

## 2016-03-22 ENCOUNTER — Ambulatory Visit (HOSPITAL_BASED_OUTPATIENT_CLINIC_OR_DEPARTMENT_OTHER): Payer: Medicare FFS

## 2016-03-22 ENCOUNTER — Other Ambulatory Visit: Payer: Self-pay | Admitting: *Deleted

## 2016-03-22 VITALS — BP 128/80 | HR 83 | Temp 97.6°F | Resp 16

## 2016-03-22 DIAGNOSIS — K7689 Other specified diseases of liver: Secondary | ICD-10-CM | POA: Diagnosis not present

## 2016-03-22 DIAGNOSIS — C50411 Malignant neoplasm of upper-outer quadrant of right female breast: Secondary | ICD-10-CM

## 2016-03-22 DIAGNOSIS — Z170 Estrogen receptor positive status [ER+]: Secondary | ICD-10-CM

## 2016-03-22 DIAGNOSIS — R160 Hepatomegaly, not elsewhere classified: Secondary | ICD-10-CM

## 2016-03-22 DIAGNOSIS — Z5111 Encounter for antineoplastic chemotherapy: Secondary | ICD-10-CM

## 2016-03-22 DIAGNOSIS — Z5112 Encounter for antineoplastic immunotherapy: Secondary | ICD-10-CM

## 2016-03-22 MED ORDER — PALONOSETRON HCL INJECTION 0.25 MG/5ML
0.2500 mg | Freq: Once | INTRAVENOUS | Status: AC
  Administered 2016-03-22: 0.25 mg via INTRAVENOUS

## 2016-03-22 MED ORDER — ACETAMINOPHEN 325 MG PO TABS
650.0000 mg | ORAL_TABLET | Freq: Once | ORAL | Status: AC
  Administered 2016-03-22: 650 mg via ORAL

## 2016-03-22 MED ORDER — PEGFILGRASTIM 6 MG/0.6ML ~~LOC~~ PSKT
6.0000 mg | PREFILLED_SYRINGE | Freq: Once | SUBCUTANEOUS | Status: AC
  Administered 2016-03-22: 6 mg via SUBCUTANEOUS
  Filled 2016-03-22: qty 0.6

## 2016-03-22 MED ORDER — DIPHENHYDRAMINE HCL 25 MG PO CAPS
50.0000 mg | ORAL_CAPSULE | Freq: Once | ORAL | Status: AC
  Administered 2016-03-22: 50 mg via ORAL

## 2016-03-22 MED ORDER — SODIUM CHLORIDE 0.9 % IV SOLN
840.0000 mg | Freq: Once | INTRAVENOUS | Status: AC
  Administered 2016-03-22: 840 mg via INTRAVENOUS
  Filled 2016-03-22: qty 28

## 2016-03-22 MED ORDER — PALONOSETRON HCL INJECTION 0.25 MG/5ML
INTRAVENOUS | Status: AC
  Filled 2016-03-22: qty 5

## 2016-03-22 MED ORDER — SODIUM CHLORIDE 0.9 % IV SOLN
Freq: Once | INTRAVENOUS | Status: AC
  Administered 2016-03-22: 11:00:00 via INTRAVENOUS

## 2016-03-22 MED ORDER — DIPHENHYDRAMINE HCL 25 MG PO CAPS
ORAL_CAPSULE | ORAL | Status: AC
  Filled 2016-03-22: qty 2

## 2016-03-22 MED ORDER — SODIUM CHLORIDE 0.9 % IV SOLN
Freq: Once | INTRAVENOUS | Status: AC
  Administered 2016-03-22: 15:00:00 via INTRAVENOUS
  Filled 2016-03-22: qty 5

## 2016-03-22 MED ORDER — ACETAMINOPHEN 325 MG PO TABS
ORAL_TABLET | ORAL | Status: AC
  Filled 2016-03-22: qty 2

## 2016-03-22 MED ORDER — DOCETAXEL CHEMO INJECTION 160 MG/16ML
75.0000 mg/m2 | Freq: Once | INTRAVENOUS | Status: AC
  Administered 2016-03-22: 150 mg via INTRAVENOUS
  Filled 2016-03-22: qty 15

## 2016-03-22 MED ORDER — HEPARIN SOD (PORK) LOCK FLUSH 100 UNIT/ML IV SOLN
500.0000 [IU] | Freq: Once | INTRAVENOUS | Status: AC | PRN
  Administered 2016-03-22: 500 [IU]
  Filled 2016-03-22: qty 5

## 2016-03-22 MED ORDER — TRASTUZUMAB CHEMO 150 MG IV SOLR
8.0000 mg/kg | Freq: Once | INTRAVENOUS | Status: AC
  Administered 2016-03-22: 630 mg via INTRAVENOUS
  Filled 2016-03-22: qty 30

## 2016-03-22 MED ORDER — SODIUM CHLORIDE 0.9% FLUSH
10.0000 mL | INTRAVENOUS | Status: DC | PRN
  Administered 2016-03-22: 10 mL
  Filled 2016-03-22: qty 10

## 2016-03-22 MED ORDER — SODIUM CHLORIDE 0.9 % IV SOLN
628.8000 mg | Freq: Once | INTRAVENOUS | Status: AC
  Administered 2016-03-22: 630 mg via INTRAVENOUS
  Filled 2016-03-22: qty 63

## 2016-03-22 NOTE — Progress Notes (Signed)
Patient and husband viewed ONPRO video.

## 2016-03-22 NOTE — Patient Instructions (Addendum)
[facility] Discharge Instructions for Patients Receiving Chemotherapy  Today you received the following chemotherapy agents herceptin, perjeta, taxotere and carboplatin.  To help prevent nausea and vomiting after your treatment, we encourage you to take your nausea medication.  Wait to use the zofran  for nausea until Saturday.  Until then you can use the compazine or ativan.  Do not use ativan and xanax together.   If you develop nausea and vomiting that is not controlled by your nausea medication, call the clinic.   BELOW ARE SYMPTOMS THAT SHOULD BE REPORTED IMMEDIATELY:  *FEVER GREATER THAN 100.5 F  *CHILLS WITH OR WITHOUT FEVER  NAUSEA AND VOMITING THAT IS NOT CONTROLLED WITH YOUR NAUSEA MEDICATION  *UNUSUAL SHORTNESS OF BREATH  *UNUSUAL BRUISING OR BLEEDING  TENDERNESS IN MOUTH AND THROAT WITH OR WITHOUT PRESENCE OF ULCERS  *URINARY PROBLEMS  *BOWEL PROBLEMS  UNUSUAL RASH Items with * indicate a potential emergency and should be followed up as soon as possible.  Feel free to call the clinic you have any questions or concerns. The clinic phone number is [phone].  Please show the [facility] at check-in to the Emergency Department and triage nurse.  Trastuzumab injection for infusion What is this medicine? TRASTUZUMAB (tras TOO zoo mab) is a monoclonal antibody. It is used to treat breast cancer and stomach cancer. This medicine may be used for other purposes; ask your health care provider or pharmacist if you have questions. COMMON BRAND NAME(S): Herceptin What should I tell my health care provider before I take this medicine? They need to know if you have any of these conditions: -heart disease -heart failure -infection (especially a virus infection such as chickenpox, cold sores, or herpes) -lung or breathing disease, like asthma -recent or ongoing radiation therapy -an unusual or allergic reaction to trastuzumab, benzyl alcohol, or  other medications, foods, dyes, or preservatives -pregnant or trying to get pregnant -breast-feeding How should I use this medicine? This drug is given as an infusion into a vein. It is administered in a hospital or clinic by a specially trained health care professional. Talk to your pediatrician regarding the use of this medicine in children. This medicine is not approved for use in children. Overdosage: If you think you have taken too much of this medicine contact a poison control center or emergency room at once. NOTE: This medicine is only for you. Do not share this medicine with others. What if I miss a dose? It is important not to miss a dose. Call your doctor or health care professional if you are unable to keep an appointment. What may interact with this medicine? -doxorubicin -warfarin This list may not describe all possible interactions. Give your health care provider a list of all the medicines, herbs, non-prescription drugs, or dietary supplements you use. Also tell them if you smoke, drink alcohol, or use illegal drugs. Some items may interact with your medicine. What should I watch for while using this medicine? Visit your doctor for checks on your progress. Report any side effects. Continue your course of treatment even though you feel ill unless your doctor tells you to stop. Call your doctor or health care professional for advice if you get a fever, chills or sore throat, or other symptoms of a cold or flu. Do not treat yourself. Try to avoid being around people who are sick. You may experience fever, chills and shaking during your first infusion. These effects are usually mild and can be treated  with other medicines. Report any side effects during the infusion to your health care professional. Fever and chills usually do not happen with later infusions. Do not become pregnant while taking this medicine or for 7 months after stopping it. Women should inform their doctor if they  wish to become pregnant or think they might be pregnant. Women of child-bearing potential will need to have a negative pregnancy test before starting this medicine. There is a potential for serious side effects to an unborn child. Talk to your health care professional or pharmacist for more information. Do not breast-feed an infant while taking this medicine or for 7 months after stopping it. Women must use effective birth control with this medicine. What side effects may I notice from receiving this medicine? Side effects that you should report to your doctor or health care professional as soon as possible: -breathing difficulties -chest pain or palpitations -cough -dizziness or fainting -fever or chills, sore throat -skin rash, itching or hives -swelling of the legs or ankles -unusually weak or tired Side effects that usually do not require medical attention (report to your doctor or health care professional if they continue or are bothersome): -loss of appetite -headache -muscle aches -nausea This list may not describe all possible side effects. Call your doctor for medical advice about side effects. You may report side effects to FDA at 1-800-FDA-1088. Where should I keep my medicine? This drug is given in a hospital or clinic and will not be stored at home. NOTE: This sheet is a summary. It may not cover all possible information. If you have questions about this medicine, talk to your doctor, pharmacist, or health care provider.  2017 Elsevier/Gold Standard (2015-05-19 17:16:44)  Pertuzumab injection What is this medicine? PERTUZUMAB (per TOOZ ue mab) is a monoclonal antibody. It is used to treat breast cancer. COMMON BRAND NAME(S): PERJETA What should I tell my health care provider before I take this medicine? They need to know if you have any of these conditions: -heart disease -heart failure -high blood pressure -history of irregular heart beat -recent or ongoing radiation  therapy -an unusual or allergic reaction to pertuzumab, other medicines, foods, dyes, or preservatives -pregnant or trying to get pregnant -breast-feeding How should I use this medicine? This medicine is for infusion into a vein. It is given by a health care professional in a hospital or clinic setting. Talk to your pediatrician regarding the use of this medicine in children. Special care may be needed. What if I miss a dose? It is important not to miss your dose. Call your doctor or health care professional if you are unable to keep an appointment. What may interact with this medicine? Interactions are not expected. Give your health care provider a list of all the medicines, herbs, non-prescription drugs, or dietary supplements you use. Also tell them if you smoke, drink alcohol, or use illegal drugs. Some items may interact with your medicine. What should I watch for while using this medicine? Your condition will be monitored carefully while you are receiving this medicine. Report any side effects. Continue your course of treatment even though you feel ill unless your doctor tells you to stop. Do not become pregnant while taking this medicine or for 7 months after stopping it. Women should inform their doctor if they wish to become pregnant or think they might be pregnant. Women of child-bearing potential will need to have a negative pregnancy test before starting this medicine. There is a potential for serious  side effects to an unborn child. Talk to your health care professional or pharmacist for more information. Do not breast-feed an infant while taking this medicine or for 7 months after stopping it. Women must use effective birth control with this medicine. Call your doctor or health care professional for advice if you get a fever, chills or sore throat, or other symptoms of a cold or flu. Do not treat yourself. Try to avoid being around people who are sick. You may experience fever, chills,  and headache during the infusion. Report any side effects during the infusion to your health care professional. What side effects may I notice from receiving this medicine? Side effects that you should report to your doctor or health care professional as soon as possible: -breathing problems -chest pain or palpitations -dizziness -feeling faint or lightheaded -fever or chills -skin rash, itching or hives -sore throat -swelling of the face, lips, or tongue -swelling of the legs or ankles -unusually weak or tired Side effects that usually do not require medical attention (report to your doctor or health care professional if they continue or are bothersome): -diarrhea -hair loss -nausea, vomiting -tiredness Where should I keep my medicine? This drug is given in a hospital or clinic and will not be stored at home.  2017 Elsevier/Gold Standard (2015-05-20 12:08:50)  Docetaxel injection What is this medicine? DOCETAXEL (doe se TAX el) is a chemotherapy drug. It targets fast dividing cells, like cancer cells, and causes these cells to die. This medicine is used to treat many types of cancers like breast cancer, certain stomach cancers, head and neck cancer, lung cancer, and prostate cancer. This medicine may be used for other purposes; ask your health care provider or pharmacist if you have questions. COMMON BRAND NAME(S): Docefrez, Taxotere What should I tell my health care provider before I take this medicine? They need to know if you have any of these conditions: -infection (especially a virus infection such as chickenpox, cold sores, or herpes) -liver disease -low blood counts, like low white cell, platelet, or red cell counts -an unusual or allergic reaction to docetaxel, polysorbate 80, other chemotherapy agents, other medicines, foods, dyes, or preservatives -pregnant or trying to get pregnant -breast-feeding How should I use this medicine? This drug is given as an infusion into  a vein. It is administered in a hospital or clinic by a specially trained health care professional. Talk to your pediatrician regarding the use of this medicine in children. Special care may be needed. Overdosage: If you think you have taken too much of this medicine contact a poison control center or emergency room at once. NOTE: This medicine is only for you. Do not share this medicine with others. What if I miss a dose? It is important not to miss your dose. Call your doctor or health care professional if you are unable to keep an appointment. What may interact with this medicine? -cyclosporine -erythromycin -ketoconazole -medicines to increase blood counts like filgrastim, pegfilgrastim, sargramostim -vaccines Talk to your doctor or health care professional before taking any of these medicines: -acetaminophen -aspirin -ibuprofen -ketoprofen -naproxen This list may not describe all possible interactions. Give your health care provider a list of all the medicines, herbs, non-prescription drugs, or dietary supplements you use. Also tell them if you smoke, drink alcohol, or use illegal drugs. Some items may interact with your medicine. What should I watch for while using this medicine? Your condition will be monitored carefully while you are receiving this medicine. You  will need important blood work done while you are taking this medicine. This drug may make you feel generally unwell. This is not uncommon, as chemotherapy can affect healthy cells as well as cancer cells. Report any side effects. Continue your course of treatment even though you feel ill unless your doctor tells you to stop. In some cases, you may be given additional medicines to help with side effects. Follow all directions for their use. Call your doctor or health care professional for advice if you get a fever, chills or sore throat, or other symptoms of a cold or flu. Do not treat yourself. This drug decreases your body's  ability to fight infections. Try to avoid being around people who are sick. This medicine may increase your risk to bruise or bleed. Call your doctor or health care professional if you notice any unusual bleeding. This medicine may contain alcohol in the product. You may get drowsy or dizzy. Do not drive, use machinery, or do anything that needs mental alertness until you know how this medicine affects you. Do not stand or sit up quickly, especially if you are an older patient. This reduces the risk of dizzy or fainting spells. Avoid alcoholic drinks. Do not become pregnant while taking this medicine. Women should inform their doctor if they wish to become pregnant or think they might be pregnant. There is a potential for serious side effects to an unborn child. Talk to your health care professional or pharmacist for more information. Do not breast-feed an infant while taking this medicine. What side effects may I notice from receiving this medicine? Side effects that you should report to your doctor or health care professional as soon as possible: -allergic reactions like skin rash, itching or hives, swelling of the face, lips, or tongue -low blood counts - This drug may decrease the number of white blood cells, red blood cells and platelets. You may be at increased risk for infections and bleeding. -signs of infection - fever or chills, cough, sore throat, pain or difficulty passing urine -signs of decreased platelets or bleeding - bruising, pinpoint red spots on the skin, black, tarry stools, nosebleeds -signs of decreased red blood cells - unusually weak or tired, fainting spells, lightheadedness -breathing problems -fast or irregular heartbeat -low blood pressure -mouth sores -nausea and vomiting -pain, swelling, redness or irritation at the injection site -pain, tingling, numbness in the hands or feet -swelling of the ankle, feet, hands -weight gain Side effects that usually do not require  medical attention (report to your doctor or health care professional if they continue or are bothersome): -bone pain -complete hair loss including hair on your head, underarms, pubic hair, eyebrows, and eyelashes -diarrhea -excessive tearing -changes in the color of fingernails -loosening of the fingernails -nausea -muscle pain -red flush to skin -sweating -weak or tired This list may not describe all possible side effects. Call your doctor for medical advice about side effects. You may report side effects to FDA at 1-800-FDA-1088. Where should I keep my medicine? This drug is given in a hospital or clinic and will not be stored at home. NOTE: This sheet is a summary. It may not cover all possible information. If you have questions about this medicine, talk to your doctor, pharmacist, or health care provider.  2017 Elsevier/Gold Standard (2015-05-20 12:32:56)  Carboplatin injection What is this medicine? CARBOPLATIN (KAR boe pla tin) is a chemotherapy drug. It targets fast dividing cells, like cancer cells, and causes these cells to die.  This medicine is used to treat ovarian cancer and many other cancers. This medicine may be used for other purposes; ask your health care provider or pharmacist if you have questions. COMMON BRAND NAME(S): Paraplatin What should I tell my health care provider before I take this medicine? They need to know if you have any of these conditions: -blood disorders -hearing problems -kidney disease -recent or ongoing radiation therapy -an unusual or allergic reaction to carboplatin, cisplatin, other chemotherapy, other medicines, foods, dyes, or preservatives -pregnant or trying to get pregnant -breast-feeding How should I use this medicine? This drug is usually given as an infusion into a vein. It is administered in a hospital or clinic by a specially trained health care professional. Talk to your pediatrician regarding the use of this medicine in  children. Special care may be needed. Overdosage: If you think you have taken too much of this medicine contact a poison control center or emergency room at once. NOTE: This medicine is only for you. Do not share this medicine with others. What if I miss a dose? It is important not to miss a dose. Call your doctor or health care professional if you are unable to keep an appointment. What may interact with this medicine? -medicines for seizures -medicines to increase blood counts like filgrastim, pegfilgrastim, sargramostim -some antibiotics like amikacin, gentamicin, neomycin, streptomycin, tobramycin -vaccines Talk to your doctor or health care professional before taking any of these medicines: -acetaminophen -aspirin -ibuprofen -ketoprofen -naproxen This list may not describe all possible interactions. Give your health care provider a list of all the medicines, herbs, non-prescription drugs, or dietary supplements you use. Also tell them if you smoke, drink alcohol, or use illegal drugs. Some items may interact with your medicine. What should I watch for while using this medicine? Your condition will be monitored carefully while you are receiving this medicine. You will need important blood work done while you are taking this medicine. This drug may make you feel generally unwell. This is not uncommon, as chemotherapy can affect healthy cells as well as cancer cells. Report any side effects. Continue your course of treatment even though you feel ill unless your doctor tells you to stop. In some cases, you may be given additional medicines to help with side effects. Follow all directions for their use. Call your doctor or health care professional for advice if you get a fever, chills or sore throat, or other symptoms of a cold or flu. Do not treat yourself. This drug decreases your body's ability to fight infections. Try to avoid being around people who are sick. This medicine may increase  your risk to bruise or bleed. Call your doctor or health care professional if you notice any unusual bleeding. Be careful brushing and flossing your teeth or using a toothpick because you may get an infection or bleed more easily. If you have any dental work done, tell your dentist you are receiving this medicine. Avoid taking products that contain aspirin, acetaminophen, ibuprofen, naproxen, or ketoprofen unless instructed by your doctor. These medicines may hide a fever. Do not become pregnant while taking this medicine. Women should inform their doctor if they wish to become pregnant or think they might be pregnant. There is a potential for serious side effects to an unborn child. Talk to your health care professional or pharmacist for more information. Do not breast-feed an infant while taking this medicine. What side effects may I notice from receiving this medicine? Side effects that you should  report to your doctor or health care professional as soon as possible: -allergic reactions like skin rash, itching or hives, swelling of the face, lips, or tongue -signs of infection - fever or chills, cough, sore throat, pain or difficulty passing urine -signs of decreased platelets or bleeding - bruising, pinpoint red spots on the skin, black, tarry stools, nosebleeds -signs of decreased red blood cells - unusually weak or tired, fainting spells, lightheadedness -breathing problems -changes in hearing -changes in vision -chest pain -high blood pressure -low blood counts - This drug may decrease the number of white blood cells, red blood cells and platelets. You may be at increased risk for infections and bleeding. -nausea and vomiting -pain, swelling, redness or irritation at the injection site -pain, tingling, numbness in the hands or feet -problems with balance, talking, walking -trouble passing urine or change in the amount of urine Side effects that usually do not require medical attention  (report to your doctor or health care professional if they continue or are bothersome): -hair loss -loss of appetite -metallic taste in the mouth or changes in taste This list may not describe all possible side effects. Call your doctor for medical advice about side effects. You may report side effects to FDA at 1-800-FDA-1088. Where should I keep my medicine? This drug is given in a hospital or clinic and will not be stored at home. NOTE: This sheet is a summary. It may not cover all possible information. If you have questions about this medicine, talk to your doctor, pharmacist, or health care provider.  2017 Elsevier/Gold Standard (2007-07-23 14:38:05)

## 2016-03-22 NOTE — Progress Notes (Signed)
Patient Care Team: Tobe Sos, MD as PCP - General (Internal Medicine) Viona Gilmore Evette Cristal, MD as Consulting Physician (Obstetrics and Gynecology) [name], MD as Consulting Physician (General Surgery) Nicholas Lose, MD as Consulting Physician (Hematology and Oncology) Kyung Rudd, MD as Consulting Physician (Radiation Oncology)  DIAGNOSIS:  Encounter Diagnosis  Name Primary?  . Malignant neoplasm of upper-outer quadrant of right breast in female, estrogen receptor positive ([place])     SUMMARY OF ONCOLOGIC HISTORY:   Malignant neoplasm of upper-outer quadrant of right female breast ([place])   02/24/2016 Initial Diagnosis    Right breast biopsy 1:00, grade 2-3 IDC, ER 5%, PR 0%, Ki-67 90%, HER-2 positive ratio 7.28, right breast biopsy 1:00 1.5 cm medial to the dominant mass fibrocystic changes; ultrasound and mammogram 02/23/2016: 2.5 cm mass at 1:00 position and a small 4m irregular mass 1.5 cm medial to the larger mass, T2 N0 stage II a clinical stage      03/04/2016 Breast MRI    2.9 cm irregular enhancing mass located within the right breast at 1:00 position, several T2 bright lesions in the right lobe of the liver incompletely visualized could be hepatic cysts      03/22/2016 -  Neo-Adjuvant Chemotherapy    Neo-Adj TCHP       CHIEF COMPLIANT: Cycle 1 neoadjuvant TCH Perjeta  INTERVAL HISTORY: Brenda PATESis a 60year old with above-mentioned history of right breast cancer with is here to start for cycle of neoadjuvant chemotherapy with TDevol She is anxious to get started with the chemotherapy. She denies any new problems or concerns. The port appears to be without any discomfort. Echocardiogram was normal.  REVIEW OF SYSTEMS:   Constitutional: Denies fevers, chills or abnormal weight loss Eyes: Denies blurriness of vision Ears, nose, mouth, throat, and face: Denies mucositis or sore throat Respiratory: Denies cough, dyspnea or wheezes Cardiovascular: Denies  palpitation, chest discomfort Gastrointestinal:  Denies nausea, heartburn or change in bowel habits Skin: Denies abnormal skin rashes Lymphatics: Denies new lymphadenopathy or easy bruising Neurological:Denies numbness, tingling or new weaknesses Behavioral/Psych: Mood is stable, no new changes  Extremities: No lower extremity edema All other systems were reviewed with the patient and are negative.  I have reviewed the past medical history, past surgical history, social history and family history with the patient and they are unchanged from previous note.  ALLERGIES:  is allergic to inapsine [droperidol] and propoxyphene.  MEDICATIONS:  Current Outpatient Prescriptions  Medication Sig Dispense Refill  . albuterol (PROVENTIL HFA;VENTOLIN HFA) 108 (90 Base) MCG/ACT inhaler Inhale 2 puffs into the lungs every 6 (six) hours as needed for wheezing or shortness of breath.    . ALPRAZolam (XANAX) 0.5 MG tablet Take 0.5 mg by mouth 2 (two) times daily as needed.     . betamethasone dipropionate (DIPROLENE) 0.05 % cream Apply topically 3 (three) times a week.    . Cholecalciferol (VITAMIN D3 PO) Take 2,500 mg by mouth 2 (two) times a week.     .Marland Kitchendexamethasone (DECADRON) 4 MG tablet Take 1 tablet (4 mg total) by mouth daily. Start the day before Taxotere. Then again the day after chemo for 1 day. 30 tablet 1  . GARLIC PO Take by mouth daily.    .Marland KitchenguaiFENesin (MUCINEX) 600 MG 12 hr tablet Take 600 mg by mouth 2 (two) times daily as needed.    .Marland Kitchenibuprofen (ADVIL,MOTRIN) 800 MG tablet 800 mg every 8 (eight) hours as needed for mild pain.     .Marland Kitchen  lansoprazole (PREVACID) 30 MG capsule Take 30 mg by mouth daily at 12 noon.    Marland Kitchen levocetirizine (XYZAL) 5 MG tablet Take 5 mg by mouth every evening.    . lidocaine-prilocaine (EMLA) cream Apply to affected area once 30 g 3  . LORazepam (ATIVAN) 0.5 MG tablet Take 1 tablet (0.5 mg total) by mouth at bedtime. 30 tablet 0  . montelukast (SINGULAIR) 10 MG tablet  Take 10 mg by mouth at bedtime.     . ondansetron (ZOFRAN) 8 MG tablet Take 1 tablet (8 mg total) by mouth 2 (two) times daily as needed for refractory nausea / vomiting. Start on day 3 after chemo. 30 tablet 1  . oxyCODONE (OXY IR/ROXICODONE) 5 MG immediate release tablet Take 1-2 tablets (5-10 mg total) by mouth every 6 (six) hours as needed for moderate pain, severe pain or breakthrough pain. 15 tablet 0  . PREMARIN vaginal cream Place 1 Applicatorful vaginally 2 (two) times a week.     . Probiotic Product (PROBIOTIC DAILY PO) Take 1 capsule by mouth 3 (three) times daily as needed. Varies on dosage - if on antibiotics will take up to 3 times daily, and if not on antibiotics, may take up to 1 or 2 daily    . prochlorperazine (COMPAZINE) 10 MG tablet Take 1 tablet (10 mg total) by mouth every 6 (six) hours as needed (Nausea or vomiting). 30 tablet 1  . spironolactone (ALDACTONE) 50 MG tablet Take 50 mg by mouth 2 (two) times daily.     . Triamcinolone Acetonide (NASACORT ALLERGY 24HR NA) Place 1 spray into the nose daily.    . verapamil (VERELAN PM) 240 MG 24 hr capsule 240 mg.     No current facility-administered medications for this visit.    Facility-Administered Medications Ordered in Other Visits  Medication Dose Route Frequency Provider Last Rate Last Dose  . CARBOplatin (PARAPLATIN) 630 mg in sodium chloride 0.9 % 250 mL chemo infusion  630 mg Intravenous Once Nicholas Lose, MD      . DOCEtaxel (TAXOTERE) 150 mg in dextrose 5 % 250 mL chemo infusion  75 mg/m2 (Order-Specific) Intravenous Once Nicholas Lose, MD      . fosaprepitant (EMEND) 150 mg, dexamethasone (DECADRON) 10 mg in sodium chloride 0.9 % 145 mL IVPB   Intravenous Once Nicholas Lose, MD      . palonosetron (ALOXI) injection 0.25 mg  0.25 mg Intravenous Once Nicholas Lose, MD      . pegfilgrastim (NEULASTA ONPRO KIT) injection 6 mg  6 mg Subcutaneous Once Nicholas Lose, MD      . pertuzumab (PERJETA) 840 mg in sodium chloride 0.9 %  250 mL chemo infusion  840 mg Intravenous Once Nicholas Lose, MD      . trastuzumab (HERCEPTIN) 630 mg in sodium chloride 0.9 % 250 mL chemo infusion  8 mg/kg (Order-Specific) Intravenous Once Nicholas Lose, MD 186.7 mL/hr at 03/22/16 1121 630 mg at 03/22/16 1121    PHYSICAL EXAMINATION: ECOG PERFORMANCE STATUS: 1 - Symptomatic but completely ambulatory  Vitals:   03/22/16 0846  BP: 134/86  Pulse: (!) 114  Resp: 18  Temp: 98.2 F (36.8 C)   Filed Weights   03/22/16 0846  Weight: 174 lb 6.4 oz (79.1 kg)    GENERAL:alert, no distress and comfortable SKIN: skin color, texture, turgor are normal, no rashes or significant lesions EYES: normal, Conjunctiva are pink and non-injected, sclera clear OROPHARYNX:no exudate, no erythema and lips, buccal mucosa, and tongue normal  NECK: supple, thyroid normal size, non-tender, without nodularity LYMPH:  no palpable lymphadenopathy in the cervical, axillary or inguinal LUNGS: clear to auscultation and percussion with normal breathing effort HEART: regular rate & rhythm and no murmurs and no lower extremity edema ABDOMEN:abdomen soft, non-tender and normal bowel sounds MUSCULOSKELETAL:no cyanosis of digits and no clubbing  NEURO: alert & oriented x 3 with fluent speech, no focal motor/sensory deficits EXTREMITIES: No lower extremity edema  LABORATORY DATA:  I have reviewed the data as listed   Chemistry      Component Value Date/Time   NA 139 03/20/2016 0927   NA 141 03/01/2016 1220   K 3.5 03/20/2016 0927   K 4.0 03/01/2016 1220   CL 104 03/20/2016 0927   CO2 27 03/20/2016 0927   CO2 25 03/01/2016 1220   BUN 15 03/20/2016 0927   BUN 18.2 03/01/2016 1220   CREATININE 0.94 03/20/2016 0927   CREATININE 0.9 03/01/2016 1220      Component Value Date/Time   CALCIUM 10.1 03/20/2016 0927   CALCIUM 9.6 03/01/2016 1220   ALKPHOS 128 03/01/2016 1220   AST 17 03/01/2016 1220   ALT 17 03/01/2016 1220   BILITOT 0.77 03/01/2016 1220        Lab Results  Component Value Date   WBC 6.4 03/20/2016   HGB 14.8 03/20/2016   HCT 45.2 03/20/2016   MCV 94.2 03/20/2016   PLT 272 03/20/2016   NEUTROABS 2.9 03/01/2016    ASSESSMENT & PLAN:  Malignant neoplasm of upper-outer quadrant of right female breast ([place]) Right breast biopsy 1:00, grade 2-3 IDC, ER 5%, PR 0%, Ki-67 90%, HER-2 positive ratio 7.28, right breast biopsy 1:00 1.5 cm medial to the dominant mass fibrocystic changes; ultrasound and mammogram 02/23/2016: 2.5 cm mass at 1:00 position and a small 107m irregular mass 1.5 cm medial to the larger mass, T2 N0 stage II a clinical stage  Breast MRI 03/04/2016: 2.9 cm irregular enhancing mass located within the right breast at 1:00 position, several T2 bright lesions in the right lobe of the liver incompletely visualized could be hepatic cysts.  Recommendation based on multidisciplinary tumor board: 1. Neoadjuvant chemotherapy with TCH Perjeta 6 cycles followed by Herceptin maintenance for 1 year 2. Followed by breast conserving surgery if possible with sentinel lymph node study 3. Followed by adjuvant radiation therapy if patient had lumpectomy --------------------------------------------------------------------------------------------------------------------------------------------------------------- Current Treatment: cycle 1 day 1 TCHP Antiemetics were reviewed Chemotherapy consent obtained Chemotherapy education completed Echocardiogram 03/07/2016: EF 60-65% Closely monitoring for chemotherapy toxicities. Return to clinic in one week for toxicity check  Hepatic cysts: We'll obtain an ultrasound of the abdomen for further evaluation.   No orders of the defined types were placed in this encounter.  The patient has a good understanding of the overall plan. she agrees with it. she will call with any problems that may develop before the next visit here.   GRulon Eisenmenger MD 03/22/16

## 2016-03-22 NOTE — Progress Notes (Signed)
Peer to Peer done by Dr. Lindi Adie for pt Onpro approval today. Spoke with pharmacy and confirmed that pt okay to receive Onpro.

## 2016-03-24 ENCOUNTER — Other Ambulatory Visit: Payer: Self-pay

## 2016-03-24 NOTE — Progress Notes (Signed)
Pt hydrating well. Mild bone pain from onpro but tolerable. Taking claritin po daily and motrin. Pt denies fatigue.

## 2016-03-27 ENCOUNTER — Telehealth: Payer: Self-pay | Admitting: *Deleted

## 2016-03-27 DIAGNOSIS — C50411 Malignant neoplasm of upper-outer quadrant of right female breast: Secondary | ICD-10-CM

## 2016-03-27 DIAGNOSIS — Z170 Estrogen receptor positive status [ER+]: Secondary | ICD-10-CM

## 2016-03-27 MED ORDER — FLUCONAZOLE 100 MG PO TABS: 100.0000 mg | ORAL_TABLET | Freq: Every day | ORAL | 0 refills | Status: DC

## 2016-03-27 NOTE — Telephone Encounter (Signed)
"  My throat is raw.  I'm weak as I can't eat or drink it hurts so bad.  My tongue is geographically coated white.  Using Biotene five times a day can I use it more.  I try to take ibuprofen for the swellingI just finished a round of antibiotics." Verbal order received and read back from Dr. Lindi Adie for Diflucan 100 mg daily x 7 days.  Patient notified of order and advised to use straw to drink and food precautions.  Suggested ensure or Boost after saying she hurt trying to eat a banana.

## 2016-03-28 ENCOUNTER — Telehealth: Payer: Self-pay | Admitting: *Deleted

## 2016-03-28 ENCOUNTER — Telehealth: Payer: Self-pay

## 2016-03-28 NOTE — Assessment & Plan Note (Signed)
Right breast biopsy 1:00, grade 2-3 IDC, ER 5%, PR 0%, Ki-67 90%, HER-2 positive ratio 7.28, right breast biopsy 1:00 1.5 cm medial to the dominant mass fibrocystic changes; ultrasound and mammogram 02/23/2016: 2.5 cm mass at 1:00 position and a small 72m irregular mass 1.5 cm medial to the larger mass, T2 N0 stage II a clinical stage  Breast MRI 03/04/2016: 2.9 cm irregular enhancing mass located within the right breast at 1:00 position, several T2 bright lesions in the right lobe of the liver incompletely visualized could be hepatic cysts.  Recommendationbased on multidisciplinary tumor board: 1. Neoadjuvant chemotherapy with TCH Perjeta 6 cycles followed by Herceptin maintenance for 1 year 2. Followed by breast conserving surgery if possible with sentinel lymph node study 3. Followed by adjuvant radiation therapy if patient had lumpectomy --------------------------------------------------------------------------------------------------------------------------------------------------------------- Current Treatment: cycle 1 day 8 TCHP Chemo Toxicities:  RTC in 2 weeks for cycle 2

## 2016-03-28 NOTE — Telephone Encounter (Signed)
"  I have not taken anything for temperature since 11:24 am.  Temperature starting up again = 99.3.  What is the best to take tylenol or ibuprofen?" Denies cough, chills or any other signs/symptoms.  "I just need to know what is the best to take for fever."  Reports a "total of 2500 mg tylenol today.  I've never been told to avoid either one." .  Advised she take one, 500 mg tylenol to equal 3000 mg maximum for 24 hours.  Ibuprofen is what she can use further.  Both are individualized per person and some alternate every four hours for best results.  Report to ED if any signs discussed earlier otherwise we'll see tomorrow.

## 2016-03-28 NOTE — Telephone Encounter (Signed)
Pt called stating she had a fever about 0400 of 101.4 that broke after taking tylenol. Temp now is 98.6. She denies SOB, cough, diarrhea or UTI symptoms. S/w Dr Lindi Adie and he said it is OK for her to watch right now. If she does get another fever or cough/expectoration or other symptoms to give [place] a call or go to ER. She does have an appt 11/29 for lab and Dr Lindi Adie. Discussed chemo card that she shows to staff if she does go to ER.

## 2016-03-29 ENCOUNTER — Other Ambulatory Visit: Payer: Self-pay

## 2016-03-29 ENCOUNTER — Ambulatory Visit (HOSPITAL_BASED_OUTPATIENT_CLINIC_OR_DEPARTMENT_OTHER): Payer: Medicare FFS | Admitting: Hematology and Oncology

## 2016-03-29 ENCOUNTER — Encounter: Payer: Self-pay | Admitting: Hematology and Oncology

## 2016-03-29 ENCOUNTER — Other Ambulatory Visit (HOSPITAL_BASED_OUTPATIENT_CLINIC_OR_DEPARTMENT_OTHER): Payer: Medicare FFS

## 2016-03-29 ENCOUNTER — Ambulatory Visit (HOSPITAL_BASED_OUTPATIENT_CLINIC_OR_DEPARTMENT_OTHER): Payer: Medicare FFS | Admitting: Nurse Practitioner

## 2016-03-29 VITALS — BP 110/69 | HR 110 | Temp 99.1°F | Resp 18 | Ht 65.0 in | Wt 171.5 lb

## 2016-03-29 DIAGNOSIS — E860 Dehydration: Secondary | ICD-10-CM | POA: Diagnosis not present

## 2016-03-29 DIAGNOSIS — Z170 Estrogen receptor positive status [ER+]: Secondary | ICD-10-CM

## 2016-03-29 DIAGNOSIS — R509 Fever, unspecified: Secondary | ICD-10-CM | POA: Diagnosis not present

## 2016-03-29 DIAGNOSIS — C50411 Malignant neoplasm of upper-outer quadrant of right female breast: Secondary | ICD-10-CM

## 2016-03-29 DIAGNOSIS — B379 Candidiasis, unspecified: Secondary | ICD-10-CM

## 2016-03-29 LAB — COMPREHENSIVE METABOLIC PANEL
ALT: 17 U/L (ref 0–55)
AST: 15 U/L (ref 5–34)
Albumin: 3.4 g/dL — ABNORMAL LOW (ref 3.5–5.0)
Alkaline Phosphatase: 87 U/L (ref 40–150)
Anion Gap: 11 mEq/L (ref 3–11)
BUN: 20.9 mg/dL (ref 7.0–26.0)
CO2: 24 mEq/L (ref 22–29)
Calcium: 9.8 mg/dL (ref 8.4–10.4)
Chloride: 92 mEq/L — ABNORMAL LOW (ref 98–109)
Creatinine: 1.3 mg/dL — ABNORMAL HIGH (ref 0.6–1.1)
EGFR: 44 mL/min/{1.73_m2} — ABNORMAL LOW (ref >90)
Glucose: 114 mg/dl (ref 70–140)
Potassium: 4.7 mEq/L (ref 3.5–5.1)
Sodium: 127 mEq/L — ABNORMAL LOW (ref 136–145)
Total Bilirubin: 1 mg/dL (ref 0.20–1.20)
Total Protein: 7.3 g/dL (ref 6.4–8.3)

## 2016-03-29 LAB — CBC WITH DIFFERENTIAL/PLATELET
BASO%: 0.2 % (ref 0.0–2.0)
Basophils Absolute: 0 10*3/uL (ref 0.0–0.1)
EOS%: 0.1 % (ref 0.0–7.0)
Eosinophils Absolute: 0 10*3/uL (ref 0.0–0.5)
HCT: 39.8 % (ref 34.8–46.6)
HGB: 13.4 g/dL (ref 11.6–15.9)
LYMPH%: 11 % — ABNORMAL LOW (ref 14.0–49.7)
MCH: 30.8 pg (ref 25.1–34.0)
MCHC: 33.6 g/dL (ref 31.5–36.0)
MCV: 91.4 fL (ref 79.5–101.0)
MONO#: 1 10*3/uL — ABNORMAL HIGH (ref 0.1–0.9)
MONO%: 24.9 % — ABNORMAL HIGH (ref 0.0–14.0)
NEUT#: 2.5 10*3/uL (ref 1.5–6.5)
NEUT%: 63.8 % (ref 38.4–76.8)
Platelets: 159 10*3/uL (ref 145–400)
RBC: 4.35 10*6/uL (ref 3.70–5.45)
RDW: 12.3 % (ref 11.2–14.5)
WBC: 4 10*3/uL (ref 3.9–10.3)
lymph#: 0.4 10*3/uL — ABNORMAL LOW (ref 0.9–3.3)

## 2016-03-29 MED ORDER — ONDANSETRON HCL 4 MG/2ML IJ SOLN
8.0000 mg | Freq: Once | INTRAMUSCULAR | Status: DC
  Administered 2016-03-29: 8 mg via INTRAVENOUS

## 2016-03-29 MED ORDER — LEVOFLOXACIN 500 MG PO TABS
500.0000 mg | ORAL_TABLET | Freq: Every day | ORAL | 0 refills | Status: AC
Start: 2016-03-29 — End: 2016-04-05

## 2016-03-29 MED ORDER — IBUPROFEN 200 MG PO TABS
ORAL_TABLET | ORAL | Status: AC
Start: 2016-03-29 — End: 2016-03-29
  Filled 2016-03-29: qty 2

## 2016-03-29 MED ORDER — SODIUM CHLORIDE 0.9 % IV SOLN
Freq: Once | INTRAVENOUS | Status: AC
  Administered 2016-03-29: 11:00:00 via INTRAVENOUS

## 2016-03-29 MED ORDER — ONDANSETRON HCL 4 MG/2ML IJ SOLN
INTRAMUSCULAR | Status: AC
  Filled 2016-03-29: qty 4

## 2016-03-29 MED ORDER — SODIUM CHLORIDE 0.9 % IV SOLN: 8.0000 mg | Freq: Once | INTRAVENOUS | Status: DC

## 2016-03-29 MED ORDER — FLUCONAZOLE 100 MG PO TABS: 100.0000 mg | ORAL_TABLET | Freq: Every day | ORAL | 0 refills | Status: DC

## 2016-03-29 MED ORDER — SODIUM CHLORIDE 0.9 % IV SOLN: Freq: Once | INTRAVENOUS | Status: DC

## 2016-03-29 MED ORDER — IBUPROFEN 200 MG PO TABS
400.0000 mg | ORAL_TABLET | Freq: Once | ORAL | Status: AC
  Administered 2016-03-29: 400 mg via ORAL

## 2016-03-29 MED ORDER — HEPARIN SOD (PORK) LOCK FLUSH 100 UNIT/ML IV SOLN
500.0000 [IU] | Freq: Once | INTRAVENOUS | Status: AC
  Administered 2016-03-29: 500 [IU] via INTRAVENOUS
  Filled 2016-03-29: qty 5

## 2016-03-29 MED ORDER — MAGIC MOUTHWASH W/LIDOCAINE: 5.0000 mL | Freq: Three times a day (TID) | ORAL | 1 refills | Status: DC | PRN

## 2016-03-29 MED ORDER — SODIUM CHLORIDE 0.9% FLUSH
10.0000 mL | Freq: Once | INTRAVENOUS | Status: AC
  Administered 2016-03-29: 10 mL via INTRAVENOUS
  Filled 2016-03-29: qty 10

## 2016-03-29 NOTE — Progress Notes (Signed)
Patient Care Team: Tobe Sos, MD as PCP - General (Internal Medicine) Viona Gilmore Evette Cristal, MD as Consulting Physician (Obstetrics and Gynecology) [name], MD as Consulting Physician (General Surgery) Nicholas Lose, MD as Consulting Physician (Hematology and Oncology) Kyung Rudd, MD as Consulting Physician (Radiation Oncology)  DIAGNOSIS:  Encounter Diagnosis  Name Primary?  . Malignant neoplasm of upper-outer quadrant of right breast in female, estrogen receptor positive ([place])     SUMMARY OF ONCOLOGIC HISTORY:   Malignant neoplasm of upper-outer quadrant of right female breast ([place])   02/24/2016 Initial Diagnosis    Right breast biopsy 1:00, grade 2-3 IDC, ER 5%, PR 0%, Ki-67 90%, HER-2 positive ratio 7.28, right breast biopsy 1:00 1.5 cm medial to the dominant mass fibrocystic changes; ultrasound and mammogram 02/23/2016: 2.5 cm mass at 1:00 position and a small 94m irregular mass 1.5 cm medial to the larger mass, T2 N0 stage II a clinical stage      03/04/2016 Breast MRI    2.9 cm irregular enhancing mass located within the right breast at 1:00 position, several T2 bright lesions in the right lobe of the liver incompletely visualized could be hepatic cysts      03/22/2016 -  Neo-Adjuvant Chemotherapy    Neo-Adj TCHP       CHIEF COMPLIANT: Cycle 1 day 8 neoadjuvant TCH Perjeta  INTERVAL HISTORY: AFELICIE KOCHERis a 60year old with above-mentioned mentioned history of right breast cancer currently on neoadjuvant chemotherapy with [place]. Today is cycle 1 day 8. She is here for toxicity evaluation. She reports that after chemotherapy she felt fatigued for several days. She had profound oral thrush for which she was started on Diflucan 2 days ago. Her thrush is slightly better but not significantly. She is unable to eat or drink much. She feels weak and tired today. She had low-grade temperature. She had Neulasta related bone discomfort. Because of the mouth issues she is  also feeling sick and nauseated because she is feeling hungry but cannot eat anything.  REVIEW OF SYSTEMS:   Constitutional: Denies fevers, chills or 5 pounds weight loss, generalized fatigue Eyes: Denies blurriness of vision Ears, nose, mouth, throat, and face: Severe oral mucositis with thrush Respiratory: Denies cough, dyspnea or wheezes Cardiovascular: Denies palpitation, chest discomfort Gastrointestinal:  Denies nausea, heartburn or change in bowel habits Skin: Denies abnormal skin rashes Lymphatics: Denies new lymphadenopathy or easy bruising Neurological:Denies numbness, tingling or new weaknesses Behavioral/Psych: Mood is stable, no new changes  Extremities: No lower extremity edema All other systems were reviewed with the patient and are negative.  I have reviewed the past medical history, past surgical history, social history and family history with the patient and they are unchanged from previous note.  ALLERGIES:  is allergic to inapsine [droperidol] and propoxyphene.  MEDICATIONS:  Current Outpatient Prescriptions  Medication Sig Dispense Refill  . albuterol (PROVENTIL HFA;VENTOLIN HFA) 108 (90 Base) MCG/ACT inhaler Inhale 2 puffs into the lungs every 6 (six) hours as needed for wheezing or shortness of breath.    . ALPRAZolam (XANAX) 0.5 MG tablet Take 0.5 mg by mouth 2 (two) times daily as needed.     . betamethasone dipropionate (DIPROLENE) 0.05 % cream Apply topically 3 (three) times a week.    . Cholecalciferol (VITAMIN D3 PO) Take 2,500 mg by mouth 2 (two) times a week.     .Marland Kitchendexamethasone (DECADRON) 4 MG tablet Take 1 tablet (4 mg total) by mouth daily. Start the day before Taxotere.  Then again the day after chemo for 1 day. 30 tablet 1  . fluconazole (DIFLUCAN) 100 MG tablet Take 1 tablet (100 mg total) by mouth daily. 40 tablet 0  . fluconazole (DIFLUCAN) 100 MG tablet Take 1 tablet (100 mg total) by mouth daily. 7 tablet 0  . GARLIC PO Take by mouth daily.    Marland Kitchen  guaiFENesin (MUCINEX) 600 MG 12 hr tablet Take 600 mg by mouth 2 (two) times daily as needed.    Marland Kitchen ibuprofen (ADVIL,MOTRIN) 800 MG tablet 800 mg every 8 (eight) hours as needed for mild pain.     Marland Kitchen lansoprazole (PREVACID) 30 MG capsule Take 30 mg by mouth daily at 12 noon.    Marland Kitchen levocetirizine (XYZAL) 5 MG tablet Take 5 mg by mouth every evening.    Marland Kitchen levofloxacin (LEVAQUIN) 500 MG tablet Take 1 tablet (500 mg total) by mouth daily. 7 tablet 0  . lidocaine-prilocaine (EMLA) cream Apply to affected area once 30 g 3  . LORazepam (ATIVAN) 0.5 MG tablet Take 1 tablet (0.5 mg total) by mouth at bedtime. 30 tablet 0  . magic mouthwash w/lidocaine SOLN Take 5 mLs by mouth 3 (three) times daily as needed for mouth pain. 150 mL 1  . montelukast (SINGULAIR) 10 MG tablet Take 10 mg by mouth at bedtime.     . ondansetron (ZOFRAN) 8 MG tablet Take 1 tablet (8 mg total) by mouth 2 (two) times daily as needed for refractory nausea / vomiting. Start on day 3 after chemo. 30 tablet 1  . oxyCODONE (OXY IR/ROXICODONE) 5 MG immediate release tablet Take 1-2 tablets (5-10 mg total) by mouth every 6 (six) hours as needed for moderate pain, severe pain or breakthrough pain. 15 tablet 0  . PREMARIN vaginal cream Place 1 Applicatorful vaginally 2 (two) times a week.     . Probiotic Product (PROBIOTIC DAILY PO) Take 1 capsule by mouth 3 (three) times daily as needed. Varies on dosage - if on antibiotics will take up to 3 times daily, and if not on antibiotics, may take up to 1 or 2 daily    . prochlorperazine (COMPAZINE) 10 MG tablet Take 1 tablet (10 mg total) by mouth every 6 (six) hours as needed (Nausea or vomiting). 30 tablet 1  . spironolactone (ALDACTONE) 50 MG tablet Take 50 mg by mouth 2 (two) times daily.     . Triamcinolone Acetonide (NASACORT ALLERGY 24HR NA) Place 1 spray into the nose daily.    . verapamil (VERELAN PM) 240 MG 24 hr capsule 240 mg.     No current facility-administered medications for this visit.      PHYSICAL EXAMINATION: ECOG PERFORMANCE STATUS: 1 - Symptomatic but completely ambulatory  Vitals:   03/29/16 0853  BP: 121/76  Pulse: (!) 116  Resp: 18  Temp: 97.8 F (36.6 C)   Filed Weights   03/29/16 0853  Weight: 171 lb 1.6 oz (77.6 kg)    GENERAL:alert, no distress and comfortable SKIN: skin color, texture, turgor are normal, no rashes or significant lesions EYES: normal, Conjunctiva are pink and non-injected, sclera clear OROPHARYNX:Severe erythema and whitish patches throughout her mouth and posterior pharyngeal wall. She is also having laryngitis so I expect that the thrush is even down into her larynx.  NECK: supple, thyroid normal size, non-tender, without nodularity LYMPH:  no palpable lymphadenopathy in the cervical, axillary or inguinal LUNGS: clear to auscultation and percussion with normal breathing effort HEART: regular rate & rhythm and no murmurs  and no lower extremity edema ABDOMEN:abdomen soft, non-tender and normal bowel sounds MUSCULOSKELETAL:no cyanosis of digits and no clubbing  NEURO: alert & oriented x 3 with fluent speech, no focal motor/sensory deficits EXTREMITIES: No lower extremity edema  LABORATORY DATA:  I have reviewed the data as listed   Chemistry      Component Value Date/Time   NA 127 (L) 03/29/2016 0833   K 4.7 03/29/2016 0833   CL 104 03/20/2016 0927   CO2 24 03/29/2016 0833   BUN 20.9 03/29/2016 0833   CREATININE 1.3 (H) 03/29/2016 0833      Component Value Date/Time   CALCIUM 9.8 03/29/2016 0833   ALKPHOS 87 03/29/2016 0833   AST 15 03/29/2016 0833   ALT 17 03/29/2016 0833   BILITOT 1.00 03/29/2016 0833       Lab Results  Component Value Date   WBC 4.0 03/29/2016   HGB 13.4 03/29/2016   HCT 39.8 03/29/2016   MCV 91.4 03/29/2016   PLT 159 03/29/2016   NEUTROABS 2.5 03/29/2016    ASSESSMENT & PLAN:  Malignant neoplasm of upper-outer quadrant of right female breast ([place]) Right breast biopsy 1:00, grade 2-3  IDC, ER 5%, PR 0%, Ki-67 90%, HER-2 positive ratio 7.28, right breast biopsy 1:00 1.5 cm medial to the dominant mass fibrocystic changes; ultrasound and mammogram 02/23/2016: 2.5 cm mass at 1:00 position and a small 17m irregular mass 1.5 cm medial to the larger mass, T2 N0 stage II a clinical stage  Breast MRI 03/04/2016: 2.9 cm irregular enhancing mass located within the right breast at 1:00 position, several T2 bright lesions in the right lobe of the liver incompletely visualized could be hepatic cysts.  Recommendationbased on multidisciplinary tumor board: 1. Neoadjuvant chemotherapy with TCH Perjeta 6 cycles followed by Herceptin maintenance for 1 year 2. Followed by breast conserving surgery if possible with sentinel lymph node study 3. Followed by adjuvant radiation therapy if patient had lumpectomy --------------------------------------------------------------------------------------------------------------------------------------------------------------- Current Treatment: cycle 1 day 8 TCHP Chemo Toxicities: 1. Severe thrush and mucositis: I previously prescribed Diflucan. I encouraged her to take 200 mg daily of the Diflucan until she gets improvement of symptoms. I sent a new prescription of Diflucan today. I also prescribed for Magic mouthwash.  I instructed her to stop dexamethasone with the next cycle.  2. severe fatigue  3. Nausea due to chemotherapy  We may consider lowering the dosage of next chemotherapy depending on her blood counts on her next cycle.  4. Dehydration:  we will give her IV fluids with normal saline today  5. Fevers: I prescribed her oral Levaquin for 7 days   RTC in 2 weeks for cycle 2  No orders of the defined types were placed in this encounter.  The patient has a good understanding of the overall plan. she agrees with it. she will call with any problems that may develop before the next visit here.   GRulon Eisenmenger MD 03/29/16

## 2016-03-29 NOTE — Progress Notes (Signed)
Introduced myself as her FA.  Informed her of the Patient Brenda [facility]ing for copay assistance but unfortunately she exceeds the income guidelines of that program and she's also overqualified for the [employer].  I gave her my card to contact me if her income has dropped for the 2017 tax year to see if she can get approved at that time.  She verbalized understanding.

## 2016-03-29 NOTE — Progress Notes (Signed)
Called pt to let her know that MD would like to obtain blood and ua c&s to check why pt keeps getting febrile. Discussed with Dr. Lindi Adie and noted that pt fever did come down 99.1 after IVF's and motrin. Pt states that she feels so much better after hydration. Told pt that Dr. Lindi Adie would like to place her on prophylactic abx levaquin for 1 week. Sent escript to pharmacy and she may pick it up today. Pt verbalized understanding and will call her PCP, since she lives far away and will have test done at her dr office. Told pt to call us with updates or any new concerns related to her chemo/ symptoms. Pt has no further questions at this time.

## 2016-03-29 NOTE — Addendum Note (Signed)
Addended by: Carolynne Edouard B on: 03/29/2016 09:38 AM   Modules accepted: Orders

## 2016-03-29 NOTE — Progress Notes (Signed)
Pt seen by Dr. Lindi Adie this morning. Pt was having some intermittent fevers on/off yesterday and has been resolved. Pt states that she was having oral sores and ordered magic mouth wash. Per MD, obtained order for IVF's today with [facility]. Sent urgent appt with scheduling dept. Will access pt port prior to bringing pt to get IVF's.

## 2016-03-29 NOTE — Progress Notes (Signed)
@   10:30 am VM left for Dr. Geralyn Flash nurse that pt has temp of 99.8 and also that she took po Zofran @ 7 am.   OK to give IV zofran and pt also received Ibuprofen 400 mg for fever. See MAR.  Pt feeling better at time of discharge. Had some chicken noodle soup/water and ice chips. Denies nausea.

## 2016-03-29 NOTE — Patient Instructions (Signed)
Dehydration, Adult Dehydration is a condition in which there is not enough fluid or water in the body. This happens when you lose more fluids than you take in. Important organs, such as the kidneys, brain, and heart, cannot function without a proper amount of fluids. Any loss of fluids from the body can lead to dehydration. Dehydration can range from mild to severe. This condition should be treated right away to prevent it from becoming severe. What are the causes? This condition may be caused by:  Vomiting.  Diarrhea.  Excessive sweating, such as from heat exposure or exercise.  Not drinking enough fluid, especially:  When ill.  While doing activity that requires a lot of energy.  Excessive urination.  Fever.  Infection.  Certain medicines, such as medicines that cause the body to lose excess fluid (diuretics).  Inability to access safe drinking water.  Reduced physical ability to get adequate water and food. What increases the risk? This condition is more likely to develop in people:  Who have a poorly controlled long-term (chronic) illness, such as diabetes, heart disease, or kidney disease.  Who are age 65 or older.  Who are disabled.  Who live in a place with high altitude.  Who play endurance sports. What are the signs or symptoms? Symptoms of mild dehydration may include:   Thirst.  Dry lips.  Slightly dry mouth.  Dry, warm skin.  Dizziness. Symptoms of moderate dehydration may include:   Very dry mouth.  Muscle cramps.  Dark urine. Urine may be the color of tea.  Decreased urine production.  Decreased tear production.  Heartbeat that is irregular or faster than normal (palpitations).  Headache.  Light-headedness, especially when you stand up from a sitting position.  Fainting (syncope). Symptoms of severe dehydration may include:   Changes in skin, such as:  Cold and clammy skin.  Blotchy (mottled) or pale skin.  Skin that does  not quickly return to normal after being lightly pinched and released (poor skin turgor).  Changes in body fluids, such as:  Extreme thirst.  No tear production.  Inability to sweat when body temperature is high, such as in hot weather.  Very little urine production.  Changes in vital signs, such as:  Weak pulse.  Pulse that is more than 100 beats a minute when sitting still.  Rapid breathing.  Low blood pressure.  Other changes, such as:  Sunken eyes.  Cold hands and feet.  Confusion.  Lack of energy (lethargy).  Difficulty waking up from sleep.  Short-term weight loss.  Unconsciousness. How is this diagnosed? This condition is diagnosed based on your symptoms and a physical exam. Blood and urine tests may be done to help confirm the diagnosis. How is this treated? Treatment for this condition depends on the severity. Mild or moderate dehydration can often be treated at home. Treatment should be started right away. Do not wait until dehydration becomes severe. Severe dehydration is an emergency and it needs to be treated in a hospital. Treatment for mild dehydration may include:   Drinking more fluids.  Replacing salts and minerals in your blood (electrolytes) that you may have lost. Treatment for moderate dehydration may include:   Drinking an oral rehydration solution (ORS). This is a drink that helps you replace fluids and electrolytes (rehydrate). It can be found at pharmacies and retail stores. Treatment for severe dehydration may include:   Receiving fluids through an IV tube.  Receiving an electrolyte solution through a feeding tube that is   passed through your nose and into your stomach (nasogastric tube, or NG tube).  Correcting any abnormalities in electrolytes.  Treating the underlying cause of dehydration. Follow these instructions at home:  If directed by your health care provider, drink an ORS:  Make an ORS by following instructions on the  package.  Start by drinking small amounts, about  cup (120 mL) every 5-10 minutes.  Slowly increase how much you drink until you have taken the amount recommended by your health care provider.  Drink enough clear fluid to keep your urine clear or pale yellow. If you were told to drink an ORS, finish the ORS first, then start slowly drinking other clear fluids. Drink fluids such as:  Water. Do not drink only water. Doing that can lead to having too little salt (sodium) in the body (hyponatremia).  Ice chips.  Fruit juice that you have added water to (diluted fruit juice).  Low-calorie sports drinks.  Avoid:  Alcohol.  Drinks that contain a lot of sugar. These include high-calorie sports drinks, fruit juice that is not diluted, and soda.  Caffeine.  Foods that are greasy or contain a lot of fat or sugar.  Take over-the-counter and prescription medicines only as told by your health care provider.  Do not take sodium tablets. This can lead to having too much sodium in the body (hypernatremia).  Eat foods that contain a healthy balance of electrolytes, such as bananas, oranges, potatoes, tomatoes, and spinach.  Keep all follow-up visits as told by your health care provider. This is important. Contact a health care provider if:  You have abdominal pain that:  Gets worse.  Stays in one area (localizes).  You have a rash.  You have a stiff neck.  You are more irritable than usual.  You are sleepier or more difficult to wake up than usual.  You feel weak or dizzy.  You feel very thirsty.  You have urinated only a small amount of very dark urine over 6-8 hours. Get help right away if:  You have symptoms of severe dehydration.  You cannot drink fluids without vomiting.  Your symptoms get worse with treatment.  You have a fever.  You have a severe headache.  You have vomiting or diarrhea that:  Gets worse.  Does not go away.  You have blood or green matter  (bile) in your vomit.  You have blood in your stool. This may cause stool to look black and tarry.  You have not urinated in 6-8 hours.  You faint.  Your heart rate while sitting still is over 100 beats a minute.  You have trouble breathing. This information is not intended to replace advice given to you by your health care provider. Make sure you discuss any questions you have with your health care provider. Document Released: 04/17/2005 Document Revised: 11/12/2015 Document Reviewed: 06/11/2015 Elsevier Interactive Patient Education  2017 [employer]  

## 2016-03-30 ENCOUNTER — Telehealth: Payer: Self-pay | Admitting: *Deleted

## 2016-03-30 NOTE — Telephone Encounter (Signed)
Called pt to assess symptoms after receiving IVF on 11/29. Relate "doing much better". Pt request to have abd US done at [facility] d/t the distance b/t [place] from home.  [facility] at [phone], discussed pt request. Informed to fax over order to [phone] and request to have results faxed to Dr. Lindi Adie. Fax sent and confirmation received. Called pt and discussed she may have her [place] at [place]. Called and request central scheduling to cancel [place] on 12/1 at [facility] Pt denies further needs or questions at this time.

## 2016-03-31 ENCOUNTER — Ambulatory Visit (HOSPITAL_COMMUNITY): Payer: Medicare FFS

## 2016-04-04 ENCOUNTER — Encounter: Payer: Self-pay | Admitting: *Deleted

## 2016-04-05 ENCOUNTER — Encounter: Payer: Self-pay | Admitting: Hematology and Oncology

## 2016-04-10 ENCOUNTER — Encounter: Payer: Self-pay | Admitting: *Deleted

## 2016-04-11 NOTE — Assessment & Plan Note (Signed)
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  Recommendationbased on multidisciplinary tumor board: 1. Neoadjuvant chemotherapy with TCH Perjeta 6 cycles followed by Herceptin maintenance for 1 year 2. Followed by breast conserving surgery if possible with sentinel lymph node study 3. Followed by adjuvant radiation therapy if patient had lumpectomy --------------------------------------------------------------------------------------------------------------------------------------------------------------- Current Treatment: cycle 2 day 1 TCHP Chemo Toxicities: 1. Severe thrush and mucositis: I previously prescribed Diflucan. I encouraged her to take 200 mg daily of the Diflucan until she gets improvement of symptoms. I sent a new prescription of Diflucan today. I also prescribed for Magic mouthwash.  I instructed her to stop dexamethasone with the next cycle.  2. severe fatigue  3. Nausea due to chemotherapy  We may consider lowering the dosage of next chemotherapy depending on her blood counts on her next cycle.  4. Dehydration:  we will give her IV fluids with normal saline today  5. Fevers: I prescribed her oral Levaquin for 7 days   RTC in 3 weeks for cycle 3

## 2016-04-12 ENCOUNTER — Encounter: Payer: Self-pay | Admitting: *Deleted

## 2016-04-12 ENCOUNTER — Other Ambulatory Visit (HOSPITAL_BASED_OUTPATIENT_CLINIC_OR_DEPARTMENT_OTHER): Payer: Medicare FFS

## 2016-04-12 ENCOUNTER — Encounter: Payer: Self-pay | Admitting: Hematology and Oncology

## 2016-04-12 ENCOUNTER — Ambulatory Visit (HOSPITAL_BASED_OUTPATIENT_CLINIC_OR_DEPARTMENT_OTHER): Payer: Medicare FFS

## 2016-04-12 ENCOUNTER — Ambulatory Visit (HOSPITAL_BASED_OUTPATIENT_CLINIC_OR_DEPARTMENT_OTHER): Payer: Medicare FFS | Admitting: Hematology and Oncology

## 2016-04-12 ENCOUNTER — Other Ambulatory Visit: Payer: Self-pay | Admitting: Hematology and Oncology

## 2016-04-12 DIAGNOSIS — Z5111 Encounter for antineoplastic chemotherapy: Secondary | ICD-10-CM | POA: Diagnosis not present

## 2016-04-12 DIAGNOSIS — Z170 Estrogen receptor positive status [ER+]: Secondary | ICD-10-CM

## 2016-04-12 DIAGNOSIS — C50411 Malignant neoplasm of upper-outer quadrant of right female breast: Secondary | ICD-10-CM | POA: Diagnosis not present

## 2016-04-12 DIAGNOSIS — Z5112 Encounter for antineoplastic immunotherapy: Secondary | ICD-10-CM

## 2016-04-12 LAB — COMPREHENSIVE METABOLIC PANEL
ALT: 16 U/L (ref 0–55)
AST: 18 U/L (ref 5–34)
Albumin: 3.3 g/dL — ABNORMAL LOW (ref 3.5–5.0)
Alkaline Phosphatase: 84 U/L (ref 40–150)
Anion Gap: 10 mEq/L (ref 3–11)
BUN: 14.2 mg/dL (ref 7.0–26.0)
CO2: 26 mEq/L (ref 22–29)
Calcium: 9.7 mg/dL (ref 8.4–10.4)
Chloride: 103 mEq/L (ref 98–109)
Creatinine: 1.1 mg/dL (ref 0.6–1.1)
EGFR: 55 mL/min/{1.73_m2} — ABNORMAL LOW (ref >90)
Glucose: 95 mg/dl (ref 70–140)
Potassium: 4 mEq/L (ref 3.5–5.1)
Sodium: 139 mEq/L (ref 136–145)
Total Bilirubin: 0.26 mg/dL (ref 0.20–1.20)
Total Protein: 7.6 g/dL (ref 6.4–8.3)

## 2016-04-12 LAB — CBC WITH DIFFERENTIAL/PLATELET
BASO%: 0.7 % (ref 0.0–2.0)
Basophils Absolute: 0.1 10*3/uL (ref 0.0–0.1)
EOS%: 0.2 % (ref 0.0–7.0)
Eosinophils Absolute: 0 10*3/uL (ref 0.0–0.5)
HCT: 36 % (ref 34.8–46.6)
HGB: 12.2 g/dL (ref 11.6–15.9)
LYMPH%: 16.5 % (ref 14.0–49.7)
MCH: 31.1 pg (ref 25.1–34.0)
MCHC: 33.8 g/dL (ref 31.5–36.0)
MCV: 92.1 fL (ref 79.5–101.0)
MONO#: 0.9 10*3/uL (ref 0.1–0.9)
MONO%: 9.4 % (ref 0.0–14.0)
NEUT#: 7.1 10*3/uL — ABNORMAL HIGH (ref 1.5–6.5)
NEUT%: 73.2 % (ref 38.4–76.8)
Platelets: 451 10*3/uL — ABNORMAL HIGH (ref 145–400)
RBC: 3.91 10*6/uL (ref 3.70–5.45)
RDW: 13 % (ref 11.2–14.5)
WBC: 9.6 10*3/uL (ref 3.9–10.3)
lymph#: 1.6 10*3/uL (ref 0.9–3.3)

## 2016-04-12 MED ORDER — DIPHENHYDRAMINE HCL 25 MG PO CAPS
50.0000 mg | ORAL_CAPSULE | Freq: Once | ORAL | Status: AC
  Administered 2016-04-12: 50 mg via ORAL

## 2016-04-12 MED ORDER — PEGFILGRASTIM 6 MG/0.6ML ~~LOC~~ PSKT
6.0000 mg | PREFILLED_SYRINGE | Freq: Once | SUBCUTANEOUS | Status: AC
  Administered 2016-04-12: 6 mg via SUBCUTANEOUS
  Filled 2016-04-12: qty 0.6

## 2016-04-12 MED ORDER — SODIUM CHLORIDE 0.9 % IV SOLN
Freq: Once | INTRAVENOUS | Status: AC
  Administered 2016-04-12: 10:00:00 via INTRAVENOUS
  Filled 2016-04-12: qty 5

## 2016-04-12 MED ORDER — CARBOPLATIN CHEMO INJECTION 600 MG/60ML
466.0000 mg | Freq: Once | INTRAVENOUS | Status: AC
  Administered 2016-04-12: 470 mg via INTRAVENOUS
  Filled 2016-04-12: qty 47

## 2016-04-12 MED ORDER — TRASTUZUMAB CHEMO 150 MG IV SOLR
6.0000 mg/kg | Freq: Once | INTRAVENOUS | Status: AC
  Administered 2016-04-12: 483 mg via INTRAVENOUS
  Filled 2016-04-12: qty 23

## 2016-04-12 MED ORDER — SODIUM CHLORIDE 0.9% FLUSH
10.0000 mL | INTRAVENOUS | Status: DC | PRN
  Administered 2016-04-12: 10 mL
  Filled 2016-04-12: qty 10

## 2016-04-12 MED ORDER — DIPHENOXYLATE-ATROPINE 2.5-0.025 MG PO TABS: 1.0000 | ORAL_TABLET | Freq: Four times a day (QID) | ORAL | 0 refills | Status: DC | PRN

## 2016-04-12 MED ORDER — SODIUM CHLORIDE 0.9 % IV SOLN
65.0000 mg/m2 | Freq: Once | INTRAVENOUS | Status: AC
  Administered 2016-04-12: 130 mg via INTRAVENOUS
  Filled 2016-04-12: qty 13

## 2016-04-12 MED ORDER — HEPARIN SOD (PORK) LOCK FLUSH 100 UNIT/ML IV SOLN
500.0000 [IU] | Freq: Once | INTRAVENOUS | Status: AC | PRN
  Administered 2016-04-12: 500 [IU]
  Filled 2016-04-12: qty 5

## 2016-04-12 MED ORDER — DIPHENHYDRAMINE HCL 25 MG PO CAPS
ORAL_CAPSULE | ORAL | Status: AC
  Filled 2016-04-12: qty 2

## 2016-04-12 MED ORDER — PALONOSETRON HCL INJECTION 0.25 MG/5ML
INTRAVENOUS | Status: AC
  Filled 2016-04-12: qty 5

## 2016-04-12 MED ORDER — SODIUM CHLORIDE 0.9 % IV SOLN
420.0000 mg | Freq: Once | INTRAVENOUS | Status: AC
  Administered 2016-04-12: 420 mg via INTRAVENOUS
  Filled 2016-04-12: qty 14

## 2016-04-12 MED ORDER — ACETAMINOPHEN 325 MG PO TABS
ORAL_TABLET | ORAL | Status: AC
  Filled 2016-04-12: qty 2

## 2016-04-12 MED ORDER — SODIUM CHLORIDE 0.9 % IV SOLN
Freq: Once | INTRAVENOUS | Status: AC
  Administered 2016-04-12: 10:00:00 via INTRAVENOUS

## 2016-04-12 MED ORDER — ACETAMINOPHEN 325 MG PO TABS
650.0000 mg | ORAL_TABLET | Freq: Once | ORAL | Status: AC
  Administered 2016-04-12: 650 mg via ORAL

## 2016-04-12 MED ORDER — PALONOSETRON HCL INJECTION 0.25 MG/5ML
0.2500 mg | Freq: Once | INTRAVENOUS | Status: AC
  Administered 2016-04-12: 0.25 mg via INTRAVENOUS

## 2016-04-12 NOTE — Progress Notes (Signed)
Patient Care Team: Tobe Sos, MD as PCP - General (Internal Medicine) Viona Gilmore Evette Cristal, MD as Consulting Physician (Obstetrics and Gynecology) [name], MD as Consulting Physician (General Surgery) Nicholas Lose, MD as Consulting Physician (Hematology and Oncology) Kyung Rudd, MD as Consulting Physician (Radiation Oncology)  DIAGNOSIS:  Encounter Diagnosis  Name Primary?  . 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%, PR 0%, Ki-67 90%, HER-2 positive ratio 7.28, right breast biopsy 1:00 1.5 cm medial to the dominant mass fibrocystic changes; ultrasound and mammogram 02/23/2016: 2.5 cm mass at 1:00 position and a small 36m irregular mass 1.5 cm medial to the larger mass, T2 N0 stage II a clinical stage      03/04/2016 Breast MRI    2.9 cm irregular enhancing mass located within the right breast at 1:00 position, several T2 bright lesions in the right lobe of the liver incompletely visualized could be hepatic cysts      03/22/2016 -  Neo-Adjuvant Chemotherapy    Neo-Adj TCHP       CHIEF COMPLIANT: Cycle 2 TCH Perjeta  INTERVAL HISTORY: AALLAN BACIGALUPIis a 60year old with above-mentioned history of right breast cancer currently on neoadjuvant chemotherapy with [place]. After cycle when she had profound fatigue that lasted 7 days. She has severe thrush that lasted a whole 2 weeks. The thrush has finally resolved. Because of this thrush she was not eating or drinking much. She required IV fluids week after chemotherapy. She also had diarrhea due to Perjeta. It did not get fully improved with Imodium. She denies any fevers or chills. She doesn't Diflucan twice a day for the whole weakens finally the thrush has resolved. She was  eating better for the past 1 week.  REVIEW OF SYSTEMS:   Constitutional: Denies fevers, chills or abnormal weight loss Eyes: Denies blurriness of vision Ears, nose, mouth, throat, and face: Oral thrush has resolved Resp: denies dyspnea or wheezes Cardiovascular: Denies palpitation, chest discomfort Gastrointestinal:  Denies nausea, heartburn or change in bowel habits Skin: Denies abnormal skin rashes Lymphatics: Denies new lymphadenopathy or easy bruising Neurological:Denies numbness, tingling or new weaknesses Behavioral/Psych: Mood is stable, no new changes  Extremities: No lower extremity edema Breast:  denies any pain or lumps or nodules in either breasts All other systems were reviewed with the patient and are negative.  I have reviewed the past medical history, past surgical history, social history and family history with the patient and they are unchanged from previous note.  ALLERGIES:  is allergic to inapsine [droperidol] and propoxyphene.  MEDICATIONS:  Current Outpatient Prescriptions  Medication Sig Dispense Refill  . albuterol (PROVENTIL HFA;VENTOLIN HFA) 108 (90 Base) MCG/ACT inhaler Inhale 2 puffs into the lungs every 6 (six) hours as needed for wheezing or shortness of breath.    . ALPRAZolam (XANAX) 0.5 MG tablet Take 0.5 mg by mouth 2 (two) times daily as needed.     . betamethasone dipropionate (DIPROLENE) 0.05 % cream Apply topically 3 (three) times a week.    . Cholecalciferol (VITAMIN D3 PO) Take 2,500 mg by mouth 2 (two) times a week.     .Marland Kitchendexamethasone (DECADRON) 4 MG tablet Take 1 tablet (4 mg total) by mouth daily. Start the day before  Taxotere. Then again the day after chemo for 1 day. 30 tablet 1  . diphenoxylate-atropine (LOMOTIL) 2.5-0.025 MG tablet Take 1 tablet by mouth 4 (four) times daily as needed for diarrhea or loose stools. 30 tablet 0  . fluconazole (DIFLUCAN) 100 MG tablet Take 1 tablet (100 mg total) by mouth daily. 40 tablet 0  . fluconazole  (DIFLUCAN) 100 MG tablet Take 1 tablet (100 mg total) by mouth daily. 7 tablet 0  . GARLIC PO Take by mouth daily.    Marland Kitchen guaiFENesin (MUCINEX) 600 MG 12 hr tablet Take 600 mg by mouth 2 (two) times daily as needed.    Marland Kitchen ibuprofen (ADVIL,MOTRIN) 800 MG tablet 800 mg every 8 (eight) hours as needed for mild pain.     Marland Kitchen lansoprazole (PREVACID) 30 MG capsule Take 30 mg by mouth daily at 12 noon.    Marland Kitchen levocetirizine (XYZAL) 5 MG tablet Take 5 mg by mouth every evening.    . lidocaine-prilocaine (EMLA) cream Apply to affected area once 30 g 3  . LORazepam (ATIVAN) 0.5 MG tablet Take 1 tablet (0.5 mg total) by mouth at bedtime. 30 tablet 0  . magic mouthwash w/lidocaine SOLN Take 5 mLs by mouth 3 (three) times daily as needed for mouth pain. 150 mL 1  . montelukast (SINGULAIR) 10 MG tablet Take 10 mg by mouth at bedtime.     . ondansetron (ZOFRAN) 8 MG tablet Take 1 tablet (8 mg total) by mouth 2 (two) times daily as needed for refractory nausea / vomiting. Start on day 3 after chemo. 30 tablet 1  . oxyCODONE (OXY IR/ROXICODONE) 5 MG immediate release tablet Take 1-2 tablets (5-10 mg total) by mouth every 6 (six) hours as needed for moderate pain, severe pain or breakthrough pain. 15 tablet 0  . PREMARIN vaginal cream Place 1 Applicatorful vaginally 2 (two) times a week.     . Probiotic Product (PROBIOTIC DAILY PO) Take 1 capsule by mouth 3 (three) times daily as needed. Varies on dosage - if on antibiotics will take up to 3 times daily, and if not on antibiotics, may take up to 1 or 2 daily    . prochlorperazine (COMPAZINE) 10 MG tablet Take 1 tablet (10 mg total) by mouth every 6 (six) hours as needed (Nausea or vomiting). 30 tablet 1  . spironolactone (ALDACTONE) 50 MG tablet Take 50 mg by mouth 2 (two) times daily.     . Triamcinolone Acetonide (NASACORT ALLERGY 24HR NA) Place 1 spray into the nose daily.    . verapamil (VERELAN PM) 240 MG 24 hr capsule 240 mg.     No current facility-administered  medications for this visit.    Facility-Administered Medications Ordered in Other Visits  Medication Dose Route Frequency Provider Last Rate Last Dose  . CARBOplatin (PARAPLATIN) 470 mg in sodium chloride 0.9 % 250 mL chemo infusion  470 mg Intravenous Once Nicholas Lose, MD      . DOCEtaxel (TAXOTERE) 130 mg in sodium chloride 0.9 % 250 mL chemo infusion  65 mg/m2 (Order-Specific) Intravenous Once Nicholas Lose, MD 263 mL/hr at 04/12/16 1141 130 mg at 04/12/16 1141  . heparin lock flush 100 unit/mL  500 Units Intracatheter Once PRN Nicholas Lose, MD      . pegfilgrastim (NEULASTA ONPRO KIT) injection 6 mg  6 mg Subcutaneous Once Nicholas Lose, MD      . sodium chloride flush (NS) 0.9 % injection 10 mL  10 mL Intracatheter PRN Nicholas Lose, MD  PHYSICAL EXAMINATION: ECOG PERFORMANCE STATUS: 1 - Symptomatic but completely ambulatory  Vitals:   04/12/16 0853  BP: 120/74  Pulse: 95  Resp: 18  Temp: 97.7 F (36.5 C)   Filed Weights   04/12/16 0853  Weight: 168 lb (76.2 kg)    GENERAL:alert, no distress and comfortable SKIN: skin color, texture, turgor are normal, no rashes or significant lesions EYES: normal, Conjunctiva are pink and non-injected, sclera clear OROPHARYNX:no exudate, no erythema and lips, buccal mucosa, and tongue normal  NECK: supple, thyroid normal size, non-tender, without nodularity LYMPH:  no palpable lymphadenopathy in the cervical, axillary or inguinal LUNGS: clear to auscultation and percussion with normal breathing effort HEART: regular rate & rhythm and no murmurs and no lower extremity edema ABDOMEN:abdomen soft, non-tender and normal bowel sounds MUSCULOSKELETAL:no cyanosis of digits and no clubbing  NEURO: alert & oriented x 3 with fluent speech, no focal motor/sensory deficits EXTREMITIES: No lower extremity edema  LABORATORY DATA:  I have reviewed the data as listed   Chemistry      Component Value Date/Time   NA 139 04/12/2016 0831   K 4.0  04/12/2016 0831   CL 104 03/20/2016 0927   CO2 26 04/12/2016 0831   BUN 14.2 04/12/2016 0831   CREATININE 1.1 04/12/2016 0831      Component Value Date/Time   CALCIUM 9.7 04/12/2016 0831   ALKPHOS 84 04/12/2016 0831   AST 18 04/12/2016 0831   ALT 16 04/12/2016 0831   BILITOT 0.26 04/12/2016 0831       Lab Results  Component Value Date   WBC 9.6 04/12/2016   HGB 12.2 04/12/2016   HCT 36.0 04/12/2016   MCV 92.1 04/12/2016   PLT 451 (H) 04/12/2016   NEUTROABS 7.1 (H) 04/12/2016    ASSESSMENT & PLAN:  Malignant neoplasm of upper-outer quadrant of right female breast (HCC) Right breast biopsy 1:00, 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  Recommendationbased on multidisciplinary tumor board: 1. Neoadjuvant chemotherapy with TCH Perjeta 6 cycles followed by Herceptin maintenance for 1 year 2. Followed by breast conserving surgery if possible with sentinel lymph node study 3. Followed by adjuvant radiation therapy if patient had lumpectomy --------------------------------------------------------------------------------------------------------------------------------------------------------------- Current Treatment: cycle 2 day 1 TCHP Chemo Toxicities: 1. Severe thrush and mucositis: Improved with Diflucan. She has additional Diflucan to take if necessary. I also prescribed for Magic mouthwash.  I instructed her to stop dexamethasone with cycle 2.  2. severe fatigue  3. Nausea due to chemotherapy  I  reviews the dosage of cycle 2 of chemotherapy. 4. Dehydration:  we had given her IV fluids with normal saline one cycle 1 day 8 5. Fevers: She did not take any antibiotic because she did not continue to have fevers after she left the clinic. 6. Vaginal/vulvar sores: Using beclomethasone ointment. If it does not get better she plans to use estradiol ointment that she has from her gynecologist. She was asking if I could increase the dosage of the estradiol. I recommended that she does  not take it regularly or for any prolonged periods.  RTC in 3 weeks for cycle 3  No orders of the defined types were placed in this encounter.  The patient has a good understanding of the overall plan. she agrees with it. she will call with any problems that may develop before the next visit here.   Rulon Eisenmenger, MD 04/12/16

## 2016-04-12 NOTE — Patient Instructions (Signed)
[facility] Discharge Instructions for Patients Receiving Chemotherapy  Today you received the following chemotherapy agents taxotere/carboplatin/herceptin/perjeta  To help prevent nausea and vomiting after your treatment, we encourage you to take your nausea medication as directed   If you develop nausea and vomiting that is not controlled by your nausea medication, call the clinic.   BELOW ARE SYMPTOMS THAT SHOULD BE REPORTED IMMEDIATELY:  *FEVER GREATER THAN 100.5 F  *CHILLS WITH OR WITHOUT FEVER  NAUSEA AND VOMITING THAT IS NOT CONTROLLED WITH YOUR NAUSEA MEDICATION  *UNUSUAL SHORTNESS OF BREATH  *UNUSUAL BRUISING OR BLEEDING  TENDERNESS IN MOUTH AND THROAT WITH OR WITHOUT PRESENCE OF ULCERS  *URINARY PROBLEMS  *BOWEL PROBLEMS  UNUSUAL RASH Items with * indicate a potential emergency and should be followed up as soon as possible.  Feel free to call the clinic you have any questions or concerns. The clinic phone number is [phone].  

## 2016-04-14 ENCOUNTER — Telehealth: Payer: Self-pay

## 2016-04-14 NOTE — Telephone Encounter (Signed)
Called pt but no answer. lvm with call back number to discuss about coming in for ivf's.

## 2016-04-18 ENCOUNTER — Ambulatory Visit (HOSPITAL_BASED_OUTPATIENT_CLINIC_OR_DEPARTMENT_OTHER): Payer: Medicare FFS

## 2016-04-18 ENCOUNTER — Other Ambulatory Visit: Payer: Self-pay

## 2016-04-18 ENCOUNTER — Telehealth: Payer: Self-pay | Admitting: *Deleted

## 2016-04-18 ENCOUNTER — Ambulatory Visit: Payer: Medicare FFS

## 2016-04-18 DIAGNOSIS — C50411 Malignant neoplasm of upper-outer quadrant of right female breast: Secondary | ICD-10-CM

## 2016-04-18 DIAGNOSIS — Z170 Estrogen receptor positive status [ER+]: Secondary | ICD-10-CM

## 2016-04-18 LAB — URINALYSIS, MICROSCOPIC - CHCC
Bilirubin (Urine): NEGATIVE
Blood: NEGATIVE
Glucose: NEGATIVE mg/dL
Ketones: NEGATIVE mg/dL
Nitrite: NEGATIVE
Protein: NEGATIVE mg/dL
Specific Gravity, Urine: 1.01 (ref 1.003–1.035)
Urobilinogen, UR: 0.2 mg/dL (ref 0.2–1)
pH: 6 (ref 4.6–8.0)

## 2016-04-18 MED ORDER — SODIUM CHLORIDE 0.9 % IV SOLN: 8.0000 mg | Freq: Once | INTRAVENOUS | Status: DC

## 2016-04-18 MED ORDER — SODIUM CHLORIDE 0.9% FLUSH
10.0000 mL | INTRAVENOUS | Status: DC | PRN
  Administered 2016-04-18: 10 mL via INTRAVENOUS
  Filled 2016-04-18: qty 10

## 2016-04-18 MED ORDER — ONDANSETRON HCL 4 MG/2ML IJ SOLN
INTRAMUSCULAR | Status: AC
  Filled 2016-04-18: qty 4

## 2016-04-18 MED ORDER — SODIUM CHLORIDE 0.9 % IV SOLN
Freq: Once | INTRAVENOUS | Status: AC
  Administered 2016-04-18: 12:00:00 via INTRAVENOUS

## 2016-04-18 MED ORDER — HEPARIN SOD (PORK) LOCK FLUSH 100 UNIT/ML IV SOLN
500.0000 [IU] | Freq: Once | INTRAVENOUS | Status: AC
  Administered 2016-04-18: 500 [IU] via INTRAVENOUS
  Filled 2016-04-18: qty 5

## 2016-04-18 MED ORDER — ONDANSETRON HCL 4 MG/2ML IJ SOLN
8.0000 mg | Freq: Once | INTRAMUSCULAR | Status: AC
  Administered 2016-04-18: 8 mg via INTRAVENOUS

## 2016-04-18 NOTE — Progress Notes (Signed)
Pt coming in for ivf's today. Pt has not been eating, hydrating well d/t n/v. Pt to receive IVF's today and nausea IV medication as needed.

## 2016-04-18 NOTE — Patient Instructions (Signed)
Dehydration, Adult Dehydration is a condition in which there is not enough fluid or water in the body. This happens when you lose more fluids than you take in. Important organs, such as the kidneys, brain, and heart, cannot function without a proper amount of fluids. Any loss of fluids from the body can lead to dehydration. Dehydration can range from mild to severe. This condition should be treated right away to prevent it from becoming severe. What are the causes? This condition may be caused by:  Vomiting.  Diarrhea.  Excessive sweating, such as from heat exposure or exercise.  Not drinking enough fluid, especially:  When ill.  While doing activity that requires a lot of energy.  Excessive urination.  Fever.  Infection.  Certain medicines, such as medicines that cause the body to lose excess fluid (diuretics).  Inability to access safe drinking water.  Reduced physical ability to get adequate water and food. What increases the risk? This condition is more likely to develop in people:  Who have a poorly controlled long-term (chronic) illness, such as diabetes, heart disease, or kidney disease.  Who are age 65 or older.  Who are disabled.  Who live in a place with high altitude.  Who play endurance sports. What are the signs or symptoms? Symptoms of mild dehydration may include:   Thirst.  Dry lips.  Slightly dry mouth.  Dry, warm skin.  Dizziness. Symptoms of moderate dehydration may include:   Very dry mouth.  Muscle cramps.  Dark urine. Urine may be the color of tea.  Decreased urine production.  Decreased tear production.  Heartbeat that is irregular or faster than normal (palpitations).  Headache.  Light-headedness, especially when you stand up from a sitting position.  Fainting (syncope). Symptoms of severe dehydration may include:   Changes in skin, such as:  Cold and clammy skin.  Blotchy (mottled) or pale skin.  Skin that does  not quickly return to normal after being lightly pinched and released (poor skin turgor).  Changes in body fluids, such as:  Extreme thirst.  No tear production.  Inability to sweat when body temperature is high, such as in hot weather.  Very little urine production.  Changes in vital signs, such as:  Weak pulse.  Pulse that is more than 100 beats a minute when sitting still.  Rapid breathing.  Low blood pressure.  Other changes, such as:  Sunken eyes.  Cold hands and feet.  Confusion.  Lack of energy (lethargy).  Difficulty waking up from sleep.  Short-term weight loss.  Unconsciousness. How is this diagnosed? This condition is diagnosed based on your symptoms and a physical exam. Blood and urine tests may be done to help confirm the diagnosis. How is this treated? Treatment for this condition depends on the severity. Mild or moderate dehydration can often be treated at home. Treatment should be started right away. Do not wait until dehydration becomes severe. Severe dehydration is an emergency and it needs to be treated in a hospital. Treatment for mild dehydration may include:   Drinking more fluids.  Replacing salts and minerals in your blood (electrolytes) that you may have lost. Treatment for moderate dehydration may include:   Drinking an oral rehydration solution (ORS). This is a drink that helps you replace fluids and electrolytes (rehydrate). It can be found at pharmacies and retail stores. Treatment for severe dehydration may include:   Receiving fluids through an IV tube.  Receiving an electrolyte solution through a feeding tube that is   passed through your nose and into your stomach (nasogastric tube, or NG tube).  Correcting any abnormalities in electrolytes.  Treating the underlying cause of dehydration. Follow these instructions at home:  If directed by your health care provider, drink an ORS:  Make an ORS by following instructions on the  package.  Start by drinking small amounts, about  cup (120 mL) every 5-10 minutes.  Slowly increase how much you drink until you have taken the amount recommended by your health care provider.  Drink enough clear fluid to keep your urine clear or pale yellow. If you were told to drink an ORS, finish the ORS first, then start slowly drinking other clear fluids. Drink fluids such as:  Water. Do not drink only water. Doing that can lead to having too little salt (sodium) in the body (hyponatremia).  Ice chips.  Fruit juice that you have added water to (diluted fruit juice).  Low-calorie sports drinks.  Avoid:  Alcohol.  Drinks that contain a lot of sugar. These include high-calorie sports drinks, fruit juice that is not diluted, and soda.  Caffeine.  Foods that are greasy or contain a lot of fat or sugar.  Take over-the-counter and prescription medicines only as told by your health care provider.  Do not take sodium tablets. This can lead to having too much sodium in the body (hypernatremia).  Eat foods that contain a healthy balance of electrolytes, such as bananas, oranges, potatoes, tomatoes, and spinach.  Keep all follow-up visits as told by your health care provider. This is important. Contact a health care provider if:  You have abdominal pain that:  Gets worse.  Stays in one area (localizes).  You have a rash.  You have a stiff neck.  You are more irritable than usual.  You are sleepier or more difficult to wake up than usual.  You feel weak or dizzy.  You feel very thirsty.  You have urinated only a small amount of very dark urine over 6-8 hours. Get help right away if:  You have symptoms of severe dehydration.  You cannot drink fluids without vomiting.  Your symptoms get worse with treatment.  You have a fever.  You have a severe headache.  You have vomiting or diarrhea that:  Gets worse.  Does not go away.  You have blood or green matter  (bile) in your vomit.  You have blood in your stool. This may cause stool to look black and tarry.  You have not urinated in 6-8 hours.  You faint.  Your heart rate while sitting still is over 100 beats a minute.  You have trouble breathing. This information is not intended to replace advice given to you by your health care provider. Make sure you discuss any questions you have with your health care provider. Document Released: 04/17/2005 Document Revised: 11/12/2015 Document Reviewed: 06/11/2015 Elsevier Interactive Patient Education  2017 [employer]  

## 2016-04-18 NOTE — Telephone Encounter (Signed)
"  I feel nauseated every time I try to drink.  I feel dehydrated and need IVF.  I'm dizzzy, light headed, tired and weak.  Heart rate = 115 to 118 when I'm up and 105 when in bed or seated.  I was told I'd have appointments scheduled I can cancel if needed.  Nothing's scheduled but I need IVF today.  It takes me an hour to get there."  Notified collaborative and Infusion charge nurse.  IVF will be scheduled for 11:30 am today.

## 2016-04-20 LAB — URINE CULTURE

## 2016-05-02 NOTE — Assessment & Plan Note (Signed)
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  Recommendationbased on multidisciplinary tumor board: 1. Neoadjuvant chemotherapy with TCH Perjeta 6 cycles followed by Herceptin maintenance for 1 year 2. Followed by breast conserving surgery if possible with sentinel lymph node study 3. Followed by adjuvant radiation therapy if patient had lumpectomy --------------------------------------------------------------------------------------------------------------------------------------------------------------- Current Treatment: cycle 3 day 1 TCHP Chemo Toxicities: 1. Severe thrush and mucositis:Prev treated with 200 mg daily of the Diflucan and Magic mouthwash. D/C dexamethasone.  2. severe fatigue  3. Nausea due to chemotherapy  We may consider lowering the dosage of next chemotherapy depending on her blood counts on her next cycle.  4. Dehydration:  we will give her IV fluids with normal saline today  5. Fevers: I prescribed her oral Levaquin for 7 days   RTC in 3 weeks for cycle 4

## 2016-05-02 NOTE — Progress Notes (Signed)
Patient Care Team: Tobe Sos, MD as PCP - General (Internal Medicine) Viona Gilmore Evette Cristal, MD as Consulting Physician (Obstetrics and Gynecology) [name], MD as Consulting Physician (General Surgery) Nicholas Lose, MD as Consulting Physician (Hematology and Oncology) Kyung Rudd, MD as Consulting Physician (Radiation Oncology)  DIAGNOSIS:  Encounter Diagnosis  Name Primary?  . Malignant neoplasm of upper-outer quadrant of right breast in female, estrogen receptor positive ([place])     SUMMARY OF ONCOLOGIC HISTORY:   Malignant neoplasm of upper-outer quadrant of right female breast ([place])   02/24/2016 Initial Diagnosis    Right breast biopsy 1:00, grade 2-3 IDC, ER 5%, PR 0%, Ki-67 90%, HER-2 positive ratio 7.28, right breast biopsy 1:00 1.5 cm medial to the dominant mass fibrocystic changes; ultrasound and mammogram 02/23/2016: 2.5 cm mass at 1:00 position and a small 58m irregular mass 1.5 cm medial to the larger mass, T2 N0 stage II a clinical stage      03/04/2016 Breast MRI    2.9 cm irregular enhancing mass located within the right breast at 1:00 position, several T2 bright lesions in the right lobe of the liver incompletely visualized could be hepatic cysts      03/22/2016 -  Neo-Adjuvant Chemotherapy    Neo-Adj TCHP       CHIEF COMPLIANT: Cycle 3 TCHP  INTERVAL HISTORY: Brenda NICKELSONis a 61year old with above-mentioned history of right breast cancer currently on neoadjuvant chemotherapy with TCarterPerjeta. After cycle 3 she had profound fatigue that lasted 7 days. She has severe thrush that finally resolved. Because of this thrush she was not eating or drinking much. She required IV fluids week after chemotherapy. She also had diarrhea due to Perjeta. She takes both Imodium and Lomotil. For the first 5 days she has 6-7 loose stools daily. After that she gets one loose stool per day. She denies any fevers or chills. She complains of vulvar ulcer for which she is using  topical steroid cream alternating with Premarin. Gynecologist has been watching this.  REVIEW OF SYSTEMS:   Constitutional: Denies fevers, chills or abnormal weight loss Eyes: Denies blurriness of vision Ears, nose, mouth, throat, and face: Denies mucositis or sore throat Respiratory: Denies cough, dyspnea or wheezes Cardiovascular: Denies palpitation, chest discomfort Gastrointestinal:  Diarrhea Skin: Denies abnormal skin rashes Lymphatics: Denies new lymphadenopathy or easy bruising Neurological:Denies numbness, tingling or new weaknesses Behavioral/Psych: Mood is stable, no new changes  Extremities: No lower extremity edema  All other systems were reviewed with the patient and are negative.  I have reviewed the past medical history, past surgical history, social history and family history with the patient and they are unchanged from previous note.  ALLERGIES:  is allergic to inapsine [droperidol] and propoxyphene.  MEDICATIONS:  Current Outpatient Prescriptions  Medication Sig Dispense Refill  . albuterol (PROVENTIL HFA;VENTOLIN HFA) 108 (90 Base) MCG/ACT inhaler Inhale 2 puffs into the lungs every 6 (six) hours as needed for wheezing or shortness of breath.    . ALPRAZolam (XANAX) 0.5 MG tablet Take 0.5 mg by mouth 2 (two) times daily as needed.     . betamethasone dipropionate (DIPROLENE) 0.05 % cream Apply topically 3 (three) times a week.    . Cholecalciferol (VITAMIN D3 PO) Take 2,500 mg by mouth 2 (two) times a week.     .Marland Kitchendexamethasone (DECADRON) 4 MG tablet Take 1 tablet (4 mg total) by mouth daily. Start the day before Taxotere. Then again the day after chemo for 1 day.  30 tablet 1  . diphenoxylate-atropine (LOMOTIL) 2.5-0.025 MG tablet Take 1 tablet by mouth 4 (four) times daily as needed for diarrhea or loose stools. 30 tablet 0  . fluconazole (DIFLUCAN) 100 MG tablet Take 1 tablet (100 mg total) by mouth daily. 40 tablet 0  . fluconazole (DIFLUCAN) 100 MG tablet Take 1  tablet (100 mg total) by mouth daily. 7 tablet 0  . GARLIC PO Take by mouth daily.    Marland Kitchen guaiFENesin (MUCINEX) 600 MG 12 hr tablet Take 600 mg by mouth 2 (two) times daily as needed.    Marland Kitchen ibuprofen (ADVIL,MOTRIN) 800 MG tablet 800 mg every 8 (eight) hours as needed for mild pain.     Marland Kitchen lansoprazole (PREVACID) 30 MG capsule Take 30 mg by mouth daily at 12 noon.    Marland Kitchen levocetirizine (XYZAL) 5 MG tablet Take 5 mg by mouth every evening.    . lidocaine-prilocaine (EMLA) cream Apply to affected area once 30 g 3  . LORazepam (ATIVAN) 0.5 MG tablet Take 1 tablet (0.5 mg total) by mouth at bedtime. 30 tablet 0  . magic mouthwash w/lidocaine SOLN Take 5 mLs by mouth 3 (three) times daily as needed for mouth pain. 150 mL 1  . montelukast (SINGULAIR) 10 MG tablet Take 10 mg by mouth at bedtime.     . ondansetron (ZOFRAN) 8 MG tablet Take 1 tablet (8 mg total) by mouth 2 (two) times daily as needed for refractory nausea / vomiting. Start on day 3 after chemo. 30 tablet 1  . oxyCODONE (OXY IR/ROXICODONE) 5 MG immediate release tablet Take 1-2 tablets (5-10 mg total) by mouth every 6 (six) hours as needed for moderate pain, severe pain or breakthrough pain. 15 tablet 0  . PREMARIN vaginal cream Place 1 Applicatorful vaginally 2 (two) times a week.     . Probiotic Product (PROBIOTIC DAILY PO) Take 1 capsule by mouth 3 (three) times daily as needed. Varies on dosage - if on antibiotics will take up to 3 times daily, and if not on antibiotics, may take up to 1 or 2 daily    . prochlorperazine (COMPAZINE) 10 MG tablet Take 1 tablet (10 mg total) by mouth every 6 (six) hours as needed (Nausea or vomiting). 30 tablet 1  . spironolactone (ALDACTONE) 50 MG tablet Take 50 mg by mouth 2 (two) times daily.     . Triamcinolone Acetonide (NASACORT ALLERGY 24HR NA) Place 1 spray into the nose daily.    . verapamil (VERELAN PM) 240 MG 24 hr capsule 240 mg.     No current facility-administered medications for this visit.      PHYSICAL EXAMINATION: ECOG PERFORMANCE STATUS: 2  Vitals:   05/03/16 0959  BP: 118/72  Pulse: 87  Resp: 18  Temp: 98.4 F (36.9 C)   Filed Weights   05/03/16 0959  Weight: 164 lb 12.8 oz (74.8 kg)    GENERAL:alert, no distress and comfortable SKIN: skin color, texture, turgor are normal, no rashes or significant lesions EYES: normal, Conjunctiva are pink and non-injected, sclera clear OROPHARYNX:no exudate, no erythema and lips, buccal mucosa, and tongue normal  NECK: supple, thyroid normal size, non-tender, without nodularity LYMPH:  no palpable lymphadenopathy in the cervical, axillary or inguinal LUNGS: clear to auscultation and percussion with normal breathing effort HEART: regular rate & rhythm and no murmurs and no lower extremity edema ABDOMEN:abdomen soft, non-tender and normal bowel sounds MUSCULOSKELETAL:no cyanosis of digits and no clubbing  NEURO: alert & oriented x 3  with fluent speech, no focal motor/sensory deficits EXTREMITIES: No lower extremity edema  LABORATORY DATA:  I have reviewed the data as listed   Chemistry      Component Value Date/Time   NA 142 05/03/2016 0909   K 4.1 05/03/2016 0909   CL 104 03/20/2016 0927   CO2 27 05/03/2016 0909   BUN 18.5 05/03/2016 0909   CREATININE 1.0 05/03/2016 0909      Component Value Date/Time   CALCIUM 9.2 05/03/2016 0909   ALKPHOS 112 05/03/2016 0909   AST 16 05/03/2016 0909   ALT 13 05/03/2016 0909   BILITOT 0.45 05/03/2016 0909       Lab Results  Component Value Date   WBC 5.6 05/03/2016   HGB 11.1 (L) 05/03/2016   HCT 34.0 (L) 05/03/2016   MCV 96.0 05/03/2016   PLT 141 (L) 05/03/2016   NEUTROABS 3.6 05/03/2016    ASSESSMENT & PLAN:  Malignant neoplasm of upper-outer quadrant of right female breast ([place]) Right breast biopsy 1:00, grade 2-3 IDC, ER 5%, PR 0%, Ki-67 90%, HER-2 positive ratio 7.28, right breast biopsy 1:00 1.5 cm medial to the dominant mass fibrocystic changes; ultrasound  and mammogram 02/23/2016: 2.5 cm mass at 1:00 position and a small 57m irregular mass 1.5 cm medial to the larger mass, T2 N0 stage II a clinical stage  Breast MRI 03/04/2016: 2.9 cm irregular enhancing mass located within the right breast at 1:00 position, several T2 bright lesions in the right lobe of the liver incompletely visualized could be hepatic cysts.  Recommendationbased on multidisciplinary tumor board: 1. Neoadjuvant chemotherapy with TCH Perjeta 6 cycles followed by Herceptin maintenance for 1 year 2. Followed by breast conserving surgery if possible with sentinel lymph node study 3. Followed by adjuvant radiation therapy if patient had lumpectomy --------------------------------------------------------------------------------------------------------------------------------------------------------------- Current Treatment: cycle 3 day 1 TCHP Chemo Toxicities: 1. Severe thrush and mucositis:Prev treated with 200 mg daily of the Diflucan and Magic mouthwash. D/C dexamethasone.  2. severe fatigue  3. Nausea due to chemotherapy  We may consider lowering the dosage of next chemotherapy depending on her blood counts on her next cycle.  4. Dehydration: Patient is pushing oral fluids aggressively to prevent dehydration 5. Fevers: Previously treated with Levaquin. 6 Chemotherapy induced anemia: Monitoring.   RTC in 3 weeks for cycle 4   No orders of the defined types were placed in this encounter.  The patient has a good understanding of the overall plan. she agrees with it. she will call with any problems that may develop before the next visit here.   GRulon Eisenmenger MD 05/03/16

## 2016-05-03 ENCOUNTER — Other Ambulatory Visit: Payer: Self-pay | Admitting: Emergency Medicine

## 2016-05-03 ENCOUNTER — Other Ambulatory Visit: Payer: Self-pay | Admitting: Hematology and Oncology

## 2016-05-03 ENCOUNTER — Ambulatory Visit (HOSPITAL_BASED_OUTPATIENT_CLINIC_OR_DEPARTMENT_OTHER): Payer: Medicare FFS

## 2016-05-03 ENCOUNTER — Ambulatory Visit (HOSPITAL_BASED_OUTPATIENT_CLINIC_OR_DEPARTMENT_OTHER): Payer: Medicare FFS | Admitting: Hematology and Oncology

## 2016-05-03 ENCOUNTER — Encounter: Payer: Self-pay | Admitting: Hematology and Oncology

## 2016-05-03 ENCOUNTER — Other Ambulatory Visit (HOSPITAL_BASED_OUTPATIENT_CLINIC_OR_DEPARTMENT_OTHER): Payer: Medicare FFS

## 2016-05-03 DIAGNOSIS — Z170 Estrogen receptor positive status [ER+]: Secondary | ICD-10-CM

## 2016-05-03 DIAGNOSIS — D6481 Anemia due to antineoplastic chemotherapy: Secondary | ICD-10-CM | POA: Diagnosis not present

## 2016-05-03 DIAGNOSIS — Z5111 Encounter for antineoplastic chemotherapy: Secondary | ICD-10-CM | POA: Diagnosis not present

## 2016-05-03 DIAGNOSIS — Z5112 Encounter for antineoplastic immunotherapy: Secondary | ICD-10-CM

## 2016-05-03 DIAGNOSIS — C50411 Malignant neoplasm of upper-outer quadrant of right female breast: Secondary | ICD-10-CM

## 2016-05-03 LAB — CBC WITH DIFFERENTIAL/PLATELET
BASO%: 0.4 % (ref 0.0–2.0)
Basophils Absolute: 0 10*3/uL (ref 0.0–0.1)
EOS%: 0 % (ref 0.0–7.0)
Eosinophils Absolute: 0 10*3/uL (ref 0.0–0.5)
HCT: 34 % — ABNORMAL LOW (ref 34.8–46.6)
HGB: 11.1 g/dL — ABNORMAL LOW (ref 11.6–15.9)
LYMPH%: 24.1 % (ref 14.0–49.7)
MCH: 31.4 pg (ref 25.1–34.0)
MCHC: 32.6 g/dL (ref 31.5–36.0)
MCV: 96 fL (ref 79.5–101.0)
MONO#: 0.6 10*3/uL (ref 0.1–0.9)
MONO%: 11.4 % (ref 0.0–14.0)
NEUT#: 3.6 10*3/uL (ref 1.5–6.5)
NEUT%: 64.1 % (ref 38.4–76.8)
Platelets: 141 10*3/uL — ABNORMAL LOW (ref 145–400)
RBC: 3.54 10*6/uL — ABNORMAL LOW (ref 3.70–5.45)
RDW: 15.5 % — ABNORMAL HIGH (ref 11.2–14.5)
WBC: 5.6 10*3/uL (ref 3.9–10.3)
lymph#: 1.4 10*3/uL (ref 0.9–3.3)

## 2016-05-03 LAB — COMPREHENSIVE METABOLIC PANEL
ALT: 13 U/L (ref 0–55)
AST: 16 U/L (ref 5–34)
Albumin: 3.8 g/dL (ref 3.5–5.0)
Alkaline Phosphatase: 112 U/L (ref 40–150)
Anion Gap: 10 mEq/L (ref 3–11)
BUN: 18.5 mg/dL (ref 7.0–26.0)
CO2: 27 mEq/L (ref 22–29)
Calcium: 9.2 mg/dL (ref 8.4–10.4)
Chloride: 105 mEq/L (ref 98–109)
Creatinine: 1 mg/dL (ref 0.6–1.1)
EGFR: 64 mL/min/{1.73_m2} — ABNORMAL LOW (ref >90)
Glucose: 84 mg/dl (ref 70–140)
Potassium: 4.1 mEq/L (ref 3.5–5.1)
Sodium: 142 mEq/L (ref 136–145)
Total Bilirubin: 0.45 mg/dL (ref 0.20–1.20)
Total Protein: 6.7 g/dL (ref 6.4–8.3)

## 2016-05-03 MED ORDER — SODIUM CHLORIDE 0.9 % IV SOLN
Freq: Once | INTRAVENOUS | Status: AC
  Administered 2016-05-03: 11:00:00 via INTRAVENOUS
  Filled 2016-05-03: qty 5

## 2016-05-03 MED ORDER — SODIUM CHLORIDE 0.9 % IV SOLN
500.0000 mg | Freq: Once | INTRAVENOUS | Status: AC
  Administered 2016-05-03: 500 mg via INTRAVENOUS
  Filled 2016-05-03: qty 50

## 2016-05-03 MED ORDER — SODIUM CHLORIDE 0.9 % IV SOLN
Freq: Once | INTRAVENOUS | Status: AC
  Administered 2016-05-03: 11:00:00 via INTRAVENOUS

## 2016-05-03 MED ORDER — PROCHLORPERAZINE MALEATE 10 MG PO TABS: 10.0000 mg | ORAL_TABLET | Freq: Four times a day (QID) | ORAL | 1 refills | Status: DC | PRN

## 2016-05-03 MED ORDER — SODIUM CHLORIDE 0.9% FLUSH
10.0000 mL | INTRAVENOUS | Status: DC | PRN
  Administered 2016-05-03: 10 mL
  Filled 2016-05-03: qty 10

## 2016-05-03 MED ORDER — PEGFILGRASTIM 6 MG/0.6ML ~~LOC~~ PSKT
6.0000 mg | PREFILLED_SYRINGE | Freq: Once | SUBCUTANEOUS | Status: AC
  Administered 2016-05-03: 6 mg via SUBCUTANEOUS
  Filled 2016-05-03: qty 0.6

## 2016-05-03 MED ORDER — DIPHENOXYLATE-ATROPINE 2.5-0.025 MG PO TABS: 1.0000 | ORAL_TABLET | Freq: Four times a day (QID) | ORAL | 0 refills | Status: DC | PRN

## 2016-05-03 MED ORDER — ACETAMINOPHEN 325 MG PO TABS
ORAL_TABLET | ORAL | Status: AC
  Filled 2016-05-03: qty 2

## 2016-05-03 MED ORDER — PALONOSETRON HCL INJECTION 0.25 MG/5ML
INTRAVENOUS | Status: AC
  Filled 2016-05-03: qty 5

## 2016-05-03 MED ORDER — DIPHENHYDRAMINE HCL 25 MG PO CAPS
ORAL_CAPSULE | ORAL | Status: AC
  Filled 2016-05-03: qty 2

## 2016-05-03 MED ORDER — SODIUM CHLORIDE 0.9 % IV SOLN
6.0000 mg/kg | Freq: Once | INTRAVENOUS | Status: AC
  Administered 2016-05-03: 483 mg via INTRAVENOUS
  Filled 2016-05-03: qty 23

## 2016-05-03 MED ORDER — PALONOSETRON HCL INJECTION 0.25 MG/5ML
0.2500 mg | Freq: Once | INTRAVENOUS | Status: AC
  Administered 2016-05-03: 0.25 mg via INTRAVENOUS

## 2016-05-03 MED ORDER — ONDANSETRON HCL 8 MG PO TABS: 8.0000 mg | ORAL_TABLET | Freq: Two times a day (BID) | ORAL | 1 refills | Status: DC | PRN

## 2016-05-03 MED ORDER — LORAZEPAM 0.5 MG PO TABS: 0.5000 mg | ORAL_TABLET | Freq: Every day | ORAL | 0 refills | Status: DC

## 2016-05-03 MED ORDER — DEXTROSE 5 % IV SOLN
65.0000 mg/m2 | Freq: Once | INTRAVENOUS | Status: AC
  Administered 2016-05-03: 130 mg via INTRAVENOUS
  Filled 2016-05-03: qty 13

## 2016-05-03 MED ORDER — HEPARIN SOD (PORK) LOCK FLUSH 100 UNIT/ML IV SOLN
500.0000 [IU] | Freq: Once | INTRAVENOUS | Status: AC | PRN
  Administered 2016-05-03: 500 [IU]
  Filled 2016-05-03: qty 5

## 2016-05-03 MED ORDER — DIPHENHYDRAMINE HCL 25 MG PO CAPS
50.0000 mg | ORAL_CAPSULE | Freq: Once | ORAL | Status: AC
  Administered 2016-05-03: 50 mg via ORAL

## 2016-05-03 MED ORDER — SODIUM CHLORIDE 0.9 % IV SOLN
420.0000 mg | Freq: Once | INTRAVENOUS | Status: AC
  Administered 2016-05-03: 420 mg via INTRAVENOUS
  Filled 2016-05-03: qty 14

## 2016-05-03 MED ORDER — ACETAMINOPHEN 325 MG PO TABS
650.0000 mg | ORAL_TABLET | Freq: Once | ORAL | Status: AC
  Administered 2016-05-03: 650 mg via ORAL

## 2016-05-03 NOTE — Patient Instructions (Signed)
[facility] Discharge Instructions for Patients Receiving Chemotherapy  Today you received the following chemotherapy agents taxotere/carboplatin/herceptin/perjeta  To help prevent nausea and vomiting after your treatment, we encourage you to take your nausea medication as directed   If you develop nausea and vomiting that is not controlled by your nausea medication, call the clinic.   BELOW ARE SYMPTOMS THAT SHOULD BE REPORTED IMMEDIATELY:  *FEVER GREATER THAN 100.5 F  *CHILLS WITH OR WITHOUT FEVER  NAUSEA AND VOMITING THAT IS NOT CONTROLLED WITH YOUR NAUSEA MEDICATION  *UNUSUAL SHORTNESS OF BREATH  *UNUSUAL BRUISING OR BLEEDING  TENDERNESS IN MOUTH AND THROAT WITH OR WITHOUT PRESENCE OF ULCERS  *URINARY PROBLEMS  *BOWEL PROBLEMS  UNUSUAL RASH Items with * indicate a potential emergency and should be followed up as soon as possible.  Feel free to call the clinic you have any questions or concerns. The clinic phone number is [phone].  

## 2016-05-09 ENCOUNTER — Encounter: Payer: Self-pay | Admitting: Hematology and Oncology

## 2016-05-10 ENCOUNTER — Other Ambulatory Visit: Payer: Self-pay | Admitting: Emergency Medicine

## 2016-05-17 ENCOUNTER — Telehealth: Payer: Self-pay | Admitting: *Deleted

## 2016-05-17 NOTE — Telephone Encounter (Signed)
I had an U/S of my liver April 05, 2016 and have not received any information of results.  U/S was done in [place] and is not showing up on Mychart either.  The navigator was to get results as well"  No results per messenger.  Will notify staff of this request for results.  Patient provided H.I.M and POD Fax numbers.

## 2016-05-19 ENCOUNTER — Telehealth: Payer: Self-pay | Admitting: Hematology and Oncology

## 2016-05-19 NOTE — Telephone Encounter (Signed)
I called the patient and informed her that I reviewed the ultrasound of the liver. This was performed and then will. It showed that she had multiple cysts in the liver largest measuring 1.8 cm. The cysts have benign appearance. There are 2 echogenic nodules measuring 9 mm in size probably hemangiomas. Gallbladder sludge was noted without inflammation. I informed her that there is no concern for metastatic disease.

## 2016-05-23 NOTE — Assessment & Plan Note (Signed)
Right breast biopsy 1:00, grade 2-3 IDC, ER 5%, PR 0%, Ki-67 90%, HER-2 positive ratio 7.28, right breast biopsy 1:00 1.5 cm medial to the dominant mass fibrocystic changes; ultrasound and mammogram 02/23/2016: 2.5 cm mass at 1:00 position and a small 84m irregular mass 1.5 cm medial to the larger mass, T2 N0 stage II a clinical stage  Breast MRI 03/04/2016: 2.9 cm irregular enhancing mass located within the right breast at 1:00 position, several T2 bright lesions in the right lobe of the liver incompletely visualized could be hepatic cysts.  Recommendationbased on multidisciplinary tumor board: 1. Neoadjuvant chemotherapy with TCH Perjeta 6 cycles followed by Herceptin maintenance for 1 year 2. Followed by breast conserving surgery if possible with sentinel lymph node study 3. Followed by adjuvant radiation therapy if patient had lumpectomy --------------------------------------------------------------------------------------------------------------------------------------------------------------- Current Treatment: cycle 4 day 1TCHP Chemo Toxicities: 1. Severe thrush and mucositis:Prev treated with 200 mg daily of the Diflucan and Magic mouthwash. D/C dexamethasone.  2.severe fatigue  3. Nausea due to chemotherapy  We may consider lowering the dosage of next chemotherapy depending on her blood counts on her next cycle.  4. Dehydration: Patient is pushing oral fluids aggressively to prevent dehydration 5. Fevers: Previously treated with Levaquin. 6 Chemotherapy induced anemia: Monitoring.   RTC in 3 weeks for cycle 5

## 2016-05-24 ENCOUNTER — Other Ambulatory Visit (HOSPITAL_BASED_OUTPATIENT_CLINIC_OR_DEPARTMENT_OTHER): Payer: Medicare FFS

## 2016-05-24 ENCOUNTER — Encounter: Payer: Self-pay | Admitting: *Deleted

## 2016-05-24 ENCOUNTER — Ambulatory Visit (HOSPITAL_BASED_OUTPATIENT_CLINIC_OR_DEPARTMENT_OTHER): Payer: Medicare FFS | Admitting: Hematology and Oncology

## 2016-05-24 ENCOUNTER — Ambulatory Visit (HOSPITAL_BASED_OUTPATIENT_CLINIC_OR_DEPARTMENT_OTHER): Payer: Medicare FFS

## 2016-05-24 ENCOUNTER — Encounter: Payer: Self-pay | Admitting: Hematology and Oncology

## 2016-05-24 DIAGNOSIS — D6481 Anemia due to antineoplastic chemotherapy: Secondary | ICD-10-CM | POA: Diagnosis not present

## 2016-05-24 DIAGNOSIS — Z170 Estrogen receptor positive status [ER+]: Secondary | ICD-10-CM

## 2016-05-24 DIAGNOSIS — C50411 Malignant neoplasm of upper-outer quadrant of right female breast: Secondary | ICD-10-CM

## 2016-05-24 DIAGNOSIS — Z5111 Encounter for antineoplastic chemotherapy: Secondary | ICD-10-CM

## 2016-05-24 DIAGNOSIS — L299 Pruritus, unspecified: Secondary | ICD-10-CM

## 2016-05-24 DIAGNOSIS — Z5112 Encounter for antineoplastic immunotherapy: Secondary | ICD-10-CM | POA: Diagnosis not present

## 2016-05-24 LAB — COMPREHENSIVE METABOLIC PANEL
ALT: 12 U/L (ref 0–55)
AST: 16 U/L (ref 5–34)
Albumin: 3.9 g/dL (ref 3.5–5.0)
Alkaline Phosphatase: 151 U/L — ABNORMAL HIGH (ref 40–150)
Anion Gap: 10 mEq/L (ref 3–11)
BUN: 15.2 mg/dL (ref 7.0–26.0)
CO2: 26 mEq/L (ref 22–29)
Calcium: 9.9 mg/dL (ref 8.4–10.4)
Chloride: 104 mEq/L (ref 98–109)
Creatinine: 1.2 mg/dL — ABNORMAL HIGH (ref 0.6–1.1)
EGFR: 50 mL/min/{1.73_m2} — ABNORMAL LOW (ref >90)
Glucose: 93 mg/dl (ref 70–140)
Potassium: 4.1 mEq/L (ref 3.5–5.1)
Sodium: 141 mEq/L (ref 136–145)
Total Bilirubin: 0.48 mg/dL (ref 0.20–1.20)
Total Protein: 6.7 g/dL (ref 6.4–8.3)

## 2016-05-24 LAB — CBC WITH DIFFERENTIAL/PLATELET
BASO%: 0.5 % (ref 0.0–2.0)
Basophils Absolute: 0 10*3/uL (ref 0.0–0.1)
EOS%: 0 % (ref 0.0–7.0)
Eosinophils Absolute: 0 10*3/uL (ref 0.0–0.5)
HCT: 34.5 % — ABNORMAL LOW (ref 34.8–46.6)
HGB: 11.3 g/dL — ABNORMAL LOW (ref 11.6–15.9)
LYMPH%: 39.4 % (ref 14.0–49.7)
MCH: 32.8 pg (ref 25.1–34.0)
MCHC: 32.8 g/dL (ref 31.5–36.0)
MCV: 100 fL (ref 79.5–101.0)
MONO#: 0.6 10*3/uL (ref 0.1–0.9)
MONO%: 16 % — ABNORMAL HIGH (ref 0.0–14.0)
NEUT#: 1.7 10*3/uL (ref 1.5–6.5)
NEUT%: 44.1 % (ref 38.4–76.8)
Platelets: 195 10*3/uL (ref 145–400)
RBC: 3.45 10*6/uL — ABNORMAL LOW (ref 3.70–5.45)
RDW: 18.4 % — ABNORMAL HIGH (ref 11.2–14.5)
WBC: 3.8 10*3/uL — ABNORMAL LOW (ref 3.9–10.3)
lymph#: 1.5 10*3/uL (ref 0.9–3.3)

## 2016-05-24 MED ORDER — ACETAMINOPHEN 325 MG PO TABS
650.0000 mg | ORAL_TABLET | Freq: Once | ORAL | Status: AC
  Administered 2016-05-24: 650 mg via ORAL

## 2016-05-24 MED ORDER — TRASTUZUMAB CHEMO 150 MG IV SOLR
6.0000 mg/kg | Freq: Once | INTRAVENOUS | Status: AC
  Administered 2016-05-24: 483 mg via INTRAVENOUS
  Filled 2016-05-24: qty 23

## 2016-05-24 MED ORDER — SODIUM CHLORIDE 0.9 % IV SOLN
Freq: Once | INTRAVENOUS | Status: AC
  Administered 2016-05-24: 10:00:00 via INTRAVENOUS
  Filled 2016-05-24: qty 5

## 2016-05-24 MED ORDER — DOCETAXEL CHEMO INJECTION 160 MG/16ML
65.0000 mg/m2 | Freq: Once | INTRAVENOUS | Status: AC
  Administered 2016-05-24: 130 mg via INTRAVENOUS
  Filled 2016-05-24: qty 13

## 2016-05-24 MED ORDER — DIPHENHYDRAMINE HCL 25 MG PO CAPS
ORAL_CAPSULE | ORAL | Status: AC
  Filled 2016-05-24: qty 2

## 2016-05-24 MED ORDER — DIPHENHYDRAMINE HCL 25 MG PO CAPS
50.0000 mg | ORAL_CAPSULE | Freq: Once | ORAL | Status: AC
  Administered 2016-05-24: 50 mg via ORAL

## 2016-05-24 MED ORDER — SODIUM CHLORIDE 0.9 % IV SOLN
Freq: Once | INTRAVENOUS | Status: AC
  Administered 2016-05-24: 09:00:00 via INTRAVENOUS

## 2016-05-24 MED ORDER — PEGFILGRASTIM 6 MG/0.6ML ~~LOC~~ PSKT
6.0000 mg | PREFILLED_SYRINGE | Freq: Once | SUBCUTANEOUS | Status: AC
  Administered 2016-05-24: 6 mg via SUBCUTANEOUS
  Filled 2016-05-24: qty 0.6

## 2016-05-24 MED ORDER — SODIUM CHLORIDE 0.9% FLUSH
10.0000 mL | INTRAVENOUS | Status: DC | PRN
  Administered 2016-05-24: 10 mL
  Filled 2016-05-24: qty 10

## 2016-05-24 MED ORDER — ACETAMINOPHEN 325 MG PO TABS
ORAL_TABLET | ORAL | Status: AC
  Filled 2016-05-24: qty 2

## 2016-05-24 MED ORDER — SODIUM CHLORIDE 0.9 % IV SOLN
437.5000 mg | Freq: Once | INTRAVENOUS | Status: AC
  Administered 2016-05-24: 440 mg via INTRAVENOUS
  Filled 2016-05-24: qty 44

## 2016-05-24 MED ORDER — PALONOSETRON HCL INJECTION 0.25 MG/5ML
INTRAVENOUS | Status: AC
  Filled 2016-05-24: qty 5

## 2016-05-24 MED ORDER — SODIUM CHLORIDE 0.9 % IV SOLN
420.0000 mg | Freq: Once | INTRAVENOUS | Status: AC
  Administered 2016-05-24: 420 mg via INTRAVENOUS
  Filled 2016-05-24: qty 14

## 2016-05-24 MED ORDER — PALONOSETRON HCL INJECTION 0.25 MG/5ML
0.2500 mg | Freq: Once | INTRAVENOUS | Status: AC
  Administered 2016-05-24: 0.25 mg via INTRAVENOUS

## 2016-05-24 MED ORDER — HEPARIN SOD (PORK) LOCK FLUSH 100 UNIT/ML IV SOLN
500.0000 [IU] | Freq: Once | INTRAVENOUS | Status: AC | PRN
  Administered 2016-05-24: 500 [IU]
  Filled 2016-05-24: qty 5

## 2016-05-24 NOTE — Progress Notes (Signed)
Patient Care Team: Tobe Sos, MD as PCP - General (Internal Medicine) Viona Gilmore Evette Cristal, MD as Consulting Physician (Obstetrics and Gynecology) [name], MD as Consulting Physician (General Surgery) Nicholas Lose, MD as Consulting Physician (Hematology and Oncology) Kyung Rudd, MD as Consulting Physician (Radiation Oncology)  DIAGNOSIS:  Encounter Diagnosis  Name Primary?  . Malignant neoplasm of upper-outer quadrant of right breast in female, estrogen receptor positive ([place])     SUMMARY OF ONCOLOGIC HISTORY:   Malignant neoplasm of upper-outer quadrant of right female breast ([place])   02/24/2016 Initial Diagnosis    Right breast biopsy 1:00, grade 2-3 IDC, ER 5%, PR 0%, Ki-67 90%, HER-2 positive ratio 7.28, right breast biopsy 1:00 1.5 cm medial to the dominant mass fibrocystic changes; ultrasound and mammogram 02/23/2016: 2.5 cm mass at 1:00 position and a small 40m irregular mass 1.5 cm medial to the larger mass, T2 N0 stage II a clinical stage      03/04/2016 Breast MRI    2.9 cm irregular enhancing mass located within the right breast at 1:00 position, several T2 bright lesions in the right lobe of the liver incompletely visualized could be hepatic cysts      03/22/2016 -  Neo-Adjuvant Chemotherapy    Neo-Adj TCHP       CHIEF COMPLIANT: Cycle 4 TCH Perjeta  INTERVAL HISTORY: Brenda BASTONEis a 61year old with above-mentioned history right breast cancer currently on neoadjuvant chemotherapy with TMercerPerjeta. She tolerated last cycle of chemotherapy extremely well. She did have diarrhea for 5-6 days and is well controlled with Lomotil. Her energy levels have remained stable. She is complaining of itching on both her feet. She denies any numbness or tingling. She does have mild tingling in the fingers of her left hand especially the thumb and index finger. Denies any nausea vomiting. She is eating well.  REVIEW OF SYSTEMS:   Constitutional: Denies fevers, chills or  abnormal weight loss Eyes: Denies blurriness of vision Ears, nose, mouth, throat, and face: Denies mucositis or sore throat Respiratory: Denies cough, dyspnea or wheezes Cardiovascular: Denies palpitation, chest discomfort Gastrointestinal:  Denies nausea, heartburn or change in bowel habits Skin: Denies abnormal skin rashes Lymphatics: Denies new lymphadenopathy or easy bruising Neurological:Denies numbness, tingling or new weaknesses Behavioral/Psych: Mood is stable, no new changes  Extremities: No lower extremity edema Breast:  denies any pain or lumps or nodules in either breasts All other systems were reviewed with the patient and are negative.  I have reviewed the past medical history, past surgical history, social history and family history with the patient and they are unchanged from previous note.  ALLERGIES:  is allergic to inapsine [droperidol] and propoxyphene.  MEDICATIONS:  Current Outpatient Prescriptions  Medication Sig Dispense Refill  . albuterol (PROVENTIL HFA;VENTOLIN HFA) 108 (90 Base) MCG/ACT inhaler Inhale 2 puffs into the lungs every 6 (six) hours as needed for wheezing or shortness of breath.    . ALPRAZolam (XANAX) 0.5 MG tablet Take 0.5 mg by mouth 2 (two) times daily as needed.     . betamethasone dipropionate (DIPROLENE) 0.05 % cream Apply topically 3 (three) times a week.    . Cholecalciferol (VITAMIN D3 PO) Take 2,500 mg by mouth 2 (two) times a week.     .Marland Kitchendexamethasone (DECADRON) 4 MG tablet Take 1 tablet (4 mg total) by mouth daily. Start the day before Taxotere. Then again the day after chemo for 1 day. 30 tablet 1  . diphenoxylate-atropine (LOMOTIL) 2.5-0.025 MG tablet  Take 1 tablet by mouth 4 (four) times daily as needed for diarrhea or loose stools. 30 tablet 0  . fluconazole (DIFLUCAN) 100 MG tablet Take 1 tablet (100 mg total) by mouth daily. 40 tablet 0  . fluconazole (DIFLUCAN) 100 MG tablet Take 1 tablet (100 mg total) by mouth daily. 7 tablet 0    . GARLIC PO Take by mouth daily.    Marland Kitchen guaiFENesin (MUCINEX) 600 MG 12 hr tablet Take 600 mg by mouth 2 (two) times daily as needed.    Marland Kitchen ibuprofen (ADVIL,MOTRIN) 800 MG tablet 800 mg every 8 (eight) hours as needed for mild pain.     Marland Kitchen lansoprazole (PREVACID) 30 MG capsule Take 30 mg by mouth daily at 12 noon.    Marland Kitchen levocetirizine (XYZAL) 5 MG tablet Take 5 mg by mouth every evening.    . lidocaine-prilocaine (EMLA) cream Apply to affected area once 30 g 3  . LORazepam (ATIVAN) 0.5 MG tablet Take 1 tablet (0.5 mg total) by mouth at bedtime. 30 tablet 0  . magic mouthwash w/lidocaine SOLN Take 5 mLs by mouth 3 (three) times daily as needed for mouth pain. 150 mL 1  . montelukast (SINGULAIR) 10 MG tablet Take 10 mg by mouth at bedtime.     . ondansetron (ZOFRAN) 8 MG tablet Take 1 tablet (8 mg total) by mouth 2 (two) times daily as needed for refractory nausea / vomiting. Start on day 3 after chemo. 30 tablet 1  . oxyCODONE (OXY IR/ROXICODONE) 5 MG immediate release tablet Take 1-2 tablets (5-10 mg total) by mouth every 6 (six) hours as needed for moderate pain, severe pain or breakthrough pain. 15 tablet 0  . PREMARIN vaginal cream Place 1 Applicatorful vaginally 2 (two) times a week.     . Probiotic Product (PROBIOTIC DAILY PO) Take 1 capsule by mouth 3 (three) times daily as needed. Varies on dosage - if on antibiotics will take up to 3 times daily, and if not on antibiotics, may take up to 1 or 2 daily    . prochlorperazine (COMPAZINE) 10 MG tablet Take 1 tablet (10 mg total) by mouth every 6 (six) hours as needed (Nausea or vomiting). 30 tablet 1  . spironolactone (ALDACTONE) 50 MG tablet Take 50 mg by mouth 2 (two) times daily.     . Triamcinolone Acetonide (NASACORT ALLERGY 24HR NA) Place 1 spray into the nose daily.    . verapamil (VERELAN PM) 240 MG 24 hr capsule 240 mg.     No current facility-administered medications for this visit.     PHYSICAL EXAMINATION: ECOG PERFORMANCE STATUS: 1  - Symptomatic but completely ambulatory  Vitals:   05/24/16 0835  BP: 115/72  Pulse: 96  Resp: 18  Temp: 98.5 F (36.9 C)   Filed Weights   05/24/16 0835  Weight: 162 lb 4.8 oz (73.6 kg)    GENERAL:alert, no distress and comfortable SKIN: skin color, texture, turgor are normal, no rashes or significant lesions EYES: normal, Conjunctiva are pink and non-injected, sclera clear OROPHARYNX:no exudate, no erythema and lips, buccal mucosa, and tongue normal  NECK: supple, thyroid normal size, non-tender, without nodularity LYMPH:  no palpable lymphadenopathy in the cervical, axillary or inguinal LUNGS: clear to auscultation and percussion with normal breathing effort HEART: regular rate & rhythm and no murmurs and no lower extremity edema ABDOMEN:abdomen soft, non-tender and normal bowel sounds MUSCULOSKELETAL:no cyanosis of digits and no clubbing  NEURO: alert & oriented x 3 with fluent speech, no  focal motor/sensory deficits EXTREMITIES: No lower extremity edema   LABORATORY DATA:  I have reviewed the data as listed   Chemistry      Component Value Date/Time   NA 142 05/03/2016 0909   K 4.1 05/03/2016 0909   CL 104 03/20/2016 0927   CO2 27 05/03/2016 0909   BUN 18.5 05/03/2016 0909   CREATININE 1.0 05/03/2016 0909      Component Value Date/Time   CALCIUM 9.2 05/03/2016 0909   ALKPHOS 112 05/03/2016 0909   AST 16 05/03/2016 0909   ALT 13 05/03/2016 0909   BILITOT 0.45 05/03/2016 0909       Lab Results  Component Value Date   WBC 3.8 (L) 05/24/2016   HGB 11.3 (L) 05/24/2016   HCT 34.5 (L) 05/24/2016   MCV 100.0 05/24/2016   PLT 195 05/24/2016   NEUTROABS 1.7 05/24/2016    ASSESSMENT & PLAN:  Malignant neoplasm of upper-outer quadrant of right female breast ([place]) Right breast biopsy 1:00, grade 2-3 IDC, ER 5%, PR 0%, Ki-67 90%, HER-2 positive ratio 7.28, right breast biopsy 1:00 1.5 cm medial to the dominant mass fibrocystic changes; ultrasound and mammogram  02/23/2016: 2.5 cm mass at 1:00 position and a small 80m irregular mass 1.5 cm medial to the larger mass, T2 N0 stage II a clinical stage  Breast MRI 03/04/2016: 2.9 cm irregular enhancing mass located within the right breast at 1:00 position, several T2 bright lesions in the right lobe of the liver incompletely visualized could be hepatic cysts.  Recommendationbased on multidisciplinary tumor board: 1. Neoadjuvant chemotherapy with TCH Perjeta 6 cycles followed by Herceptin maintenance for 1 year 2. Followed by breast conserving surgery if possible with sentinel lymph node study 3. Followed by adjuvant radiation therapy if patient had lumpectomy --------------------------------------------------------------------------------------------------------------------------------------------------------------- Current Treatment: cycle 4 day 1TCHP Chemo Toxicities: 1. Severe thrush and mucositis:Prev treated with 200 mg daily of the Diflucan and Magic mouthwash. D/C dexamethasone.  2.severe fatigue : Being monitored closely 3. Nausea due to chemotherapy  We may consider lowering the dosage of next chemotherapy depending on her blood counts on her next cycle.  4. Dehydration: Patient is pushing oral fluids aggressively to prevent dehydration 5. Fevers: Previously treated with Levaquin. 6 Chemotherapy induced anemia: Monitoring.  7. Diarrhea due to Perjeta: Well controlled with Lomotil and Imodium. 8. Itching of the feet: It is unclear if this is neuropathy related. It does not persist. So we elected to continue with the same dosage. If the symptoms get worse then we may have to discontinue Taxotere.    RTC in 3 weeks for cycle 5   I spent 25 minutes talking to the patient of which more than half was spent in counseling and coordination of care.  No orders of the defined types were placed in this encounter.  The patient has a good understanding of the overall plan. she agrees with it. she  will call with any problems that may develop before the next visit here.   GRulon Eisenmenger MD 05/24/16

## 2016-05-24 NOTE — Patient Instructions (Addendum)
Cancer Center Discharge Instructions for Patients Receiving Chemotherapy  Today you received the following chemotherapy agents Herceptin, Perjeta, Taxotere and Carboplatin  To help prevent nausea and vomiting after your treatment, we encourage you to take your nausea medication as directed. No Zofran for 3 days. Take Compazine instead.    If you develop nausea and vomiting that is not controlled by your nausea medication, call the clinic.   BELOW ARE SYMPTOMS THAT SHOULD BE REPORTED IMMEDIATELY:  *FEVER GREATER THAN 100.5 F  *CHILLS WITH OR WITHOUT FEVER  NAUSEA AND VOMITING THAT IS NOT CONTROLLED WITH YOUR NAUSEA MEDICATION  *UNUSUAL SHORTNESS OF BREATH  *UNUSUAL BRUISING OR BLEEDING  TENDERNESS IN MOUTH AND THROAT WITH OR WITHOUT PRESENCE OF ULCERS  *URINARY PROBLEMS  *BOWEL PROBLEMS  UNUSUAL RASH Items with * indicate a potential emergency and should be followed up as soon as possible.  Feel free to call the clinic you have any questions or concerns. The clinic phone number is [phone].  Please show the CHEMO ALERT CARD at check-in to the Emergency Department and triage nurse.   

## 2016-06-02 ENCOUNTER — Telehealth: Payer: Self-pay | Admitting: *Deleted

## 2016-06-02 NOTE — Telephone Encounter (Signed)
"  I missed a call from Dr. Geralyn Flash nurse.  I don't know why my phone didn't ring.  Please tell her I called.  I may be okay but asked if the diflucan my OB/GYN gave for yeast infection is enough or do I need something more for white spots, thrush."

## 2016-06-02 NOTE — Telephone Encounter (Signed)
This RN called and spoke with patient. Patient stated, "my OB/GYN gave my two Diflucan tablets yesterday. I got home and noticed white spots down the middle of my tongue. Per Dr. Lindi Adie, she can take Diflucan 100 mg tablets, one a day, for 7 days. Patient has this medication already. She is to call back next week and let us know if the thrush is gone. Patient verbalized understanding.

## 2016-06-07 ENCOUNTER — Telehealth: Payer: Self-pay

## 2016-06-07 NOTE — Telephone Encounter (Signed)
Pt said the itching on her feet that she told Dr Lindi Adie about that it is now all over her body. She had this in the past as a anxiety reaction during tax season. In the past she used hydroxyzine HCL. Currently she is taking 2 benadryl before bed and waking up in about 4 hours and having to take 2 more. The itching is worse at night, tolerable during the day. If she uses her xanax she can sleep all night but she does not want to have to use it every night. She is asking if Dr Lindi Adie can prescribe hydroxyzine HCL or if she should see her dermatologist.  Also her dr who rx her xanax will not refill it at present b/c Dr Lindi Adie put her on ativan. She says the ativan does not work for the itching/ anxiety issue, she tried it.

## 2016-06-08 ENCOUNTER — Other Ambulatory Visit: Payer: Self-pay | Admitting: Emergency Medicine

## 2016-06-08 MED ORDER — ALPRAZOLAM 0.5 MG PO TABS: 0.5000 mg | ORAL_TABLET | Freq: Two times a day (BID) | ORAL | 2 refills | Status: DC | PRN

## 2016-06-08 MED ORDER — HYDROXYZINE HCL 25 MG PO TABS: 25.0000 mg | ORAL_TABLET | Freq: Three times a day (TID) | ORAL | 3 refills | Status: DC | PRN

## 2016-06-09 ENCOUNTER — Telehealth: Payer: Self-pay | Admitting: Medical Oncology

## 2016-06-09 ENCOUNTER — Telehealth: Payer: Self-pay | Admitting: Emergency Medicine

## 2016-06-09 NOTE — Telephone Encounter (Signed)
Did not get hydroxyzine- itching is less ,but feet are red and burning. Hydroxyzine called in and requested appt with Mendel Ryder for Monday.

## 2016-06-09 NOTE — Telephone Encounter (Signed)
appt for monday given to pt.

## 2016-06-09 NOTE — Telephone Encounter (Signed)
Per Dr Lindi Adie, Rx sent for hydroxyzine and xanax to patient's pharmacy; called patient and left message notifying her of this. Left message on patient's voicemail for her to call this office for any concerns and update on how she is feeling.

## 2016-06-12 ENCOUNTER — Ambulatory Visit (HOSPITAL_BASED_OUTPATIENT_CLINIC_OR_DEPARTMENT_OTHER): Payer: Medicare FFS | Admitting: Adult Health

## 2016-06-12 ENCOUNTER — Encounter: Payer: Self-pay | Admitting: Adult Health

## 2016-06-12 VITALS — BP 124/71 | HR 95 | Temp 97.9°F | Resp 18 | Ht 65.0 in | Wt 158.4 lb

## 2016-06-12 DIAGNOSIS — C50411 Malignant neoplasm of upper-outer quadrant of right female breast: Secondary | ICD-10-CM | POA: Diagnosis not present

## 2016-06-12 DIAGNOSIS — H04209 Unspecified epiphora, unspecified lacrimal gland: Secondary | ICD-10-CM

## 2016-06-12 DIAGNOSIS — Z170 Estrogen receptor positive status [ER+]: Secondary | ICD-10-CM | POA: Diagnosis not present

## 2016-06-12 DIAGNOSIS — R208 Other disturbances of skin sensation: Secondary | ICD-10-CM

## 2016-06-12 MED ORDER — DEXAMETHASONE 1 MG PO TABS: 1.0000 mg | ORAL_TABLET | Freq: Every day | ORAL | 0 refills | Status: DC

## 2016-06-12 NOTE — Progress Notes (Signed)
Patient Care Team: Tobe Sos, MD as PCP - General (Internal Medicine) Viona Gilmore Evette Cristal, MD as Consulting Physician (Obstetrics and Gynecology) [name], MD as Consulting Physician (General Surgery) Nicholas Lose, MD as Consulting Physician (Hematology and Oncology) Kyung Rudd, MD as Consulting Physician (Radiation Oncology)  DIAGNOSIS:  Encounter Diagnosis  Name Primary?  . Malignant neoplasm of upper-outer quadrant of right breast in female, estrogen receptor positive ([place]) Yes    SUMMARY OF ONCOLOGIC HISTORY:   Malignant neoplasm of upper-outer quadrant of right female breast ([place])   02/24/2016 Initial Diagnosis    Right breast biopsy 1:00, grade 2-3 IDC, ER 5%, PR 0%, Ki-67 90%, HER-2 positive ratio 7.28, right breast biopsy 1:00 1.5 cm medial to the dominant mass fibrocystic changes; ultrasound and mammogram 02/23/2016: 2.5 cm mass at 1:00 position and a small 40m irregular mass 1.5 cm medial to the larger mass, T2 N0 stage II a clinical stage      03/04/2016 Breast MRI    2.9 cm irregular enhancing mass located within the right breast at 1:00 position, several T2 bright lesions in the right lobe of the liver incompletely visualized could be hepatic cysts      03/22/2016 -  Neo-Adjuvant Chemotherapy    Neo-Adj TCHP       CHIEF COMPLIANT: Cycle 4 day 20 TCH Perjeta, burning red feet, watery eyes  INTERVAL HISTORY: Brenda MELANDis a [age]year old with above-mentioned history right breast cancer currently on neoadjuvant chemotherapy with TCH Perjeta.  She is going to be due for cycle 5 on Wednesday, February 14.  She has had an increase in side effects since her last appointment.  She was complaining of itching feet, she says that they were itching and burning after both cycle three and cycle four, and then all of the sudden her feet turned red a couple of days ago.  She came in today for evaluation.  She denies any dryness or cracking to the hands or feet.  She denies any  tingling/numbness to the feet.  She does say that her first two digits of her left hand have some numb sensation in the pads of the fingers.  She has also experienced an increase in the watering of her eyes.  She doesn't have any blurred vision.  She got systane eye gtts on Saturday and is using these.  She is concerned about her chemotherapy dose and that it may be too strong since she has lost approximately 15-20 pounds since the start of chemotherapy.  She has had some diarrhea and nausea and continues to manage this with her anti-emetics and anti-diarrheals.  She denies any other questions or concerns.     REVIEW OF SYSTEMS:   Constitutional: Denies fevers, chills or abnormal weight loss Eyes: Denies blurriness of vision Ears, nose, mouth, throat, and face: Denies mucositis or sore throat Respiratory: Denies cough, dyspnea or wheezes Cardiovascular: Denies palpitation, chest discomfort Gastrointestinal:  Denies nausea, heartburn or change in bowel habits Skin: Denies abnormal skin rashes Lymphatics: Denies new lymphadenopathy or easy bruising Neurological:Denies numbness, tingling or new weaknesses Behavioral/Psych: Mood is stable, no new changes  Extremities: No lower extremity edema Breast:  denies any pain or lumps or nodules in either breasts All other systems were reviewed with the patient and are negative.  I have reviewed the past medical history, past surgical history, social history and family history with the patient and they are unchanged from previous note.  ALLERGIES:  is allergic to inapsine [droperidol]  and propoxyphene.  MEDICATIONS:  Current Outpatient Prescriptions  Medication Sig Dispense Refill  . albuterol (PROVENTIL HFA;VENTOLIN HFA) 108 (90 Base) MCG/ACT inhaler Inhale 2 puffs into the lungs every 6 (six) hours as needed for wheezing or shortness of breath.    . ALPRAZolam (XANAX) 0.5 MG tablet Take 1 tablet (0.5 mg total) by mouth 2 (two) times daily as needed.  60 tablet 2  . betamethasone dipropionate (DIPROLENE) 0.05 % cream Apply topically 3 (three) times a week.    . Cholecalciferol (VITAMIN D3 PO) Take 2,500 mg by mouth 2 (two) times a week.     . diphenoxylate-atropine (LOMOTIL) 2.5-0.025 MG tablet Take 1 tablet by mouth 4 (four) times daily as needed for diarrhea or loose stools. 30 tablet 0  . hydrOXYzine (ATARAX/VISTARIL) 25 MG tablet Take 1 tablet (25 mg total) by mouth 3 (three) times daily as needed. 60 tablet 3  . ibuprofen (ADVIL,MOTRIN) 800 MG tablet 800 mg every 8 (eight) hours as needed for mild pain.     Marland Kitchen lansoprazole (PREVACID) 30 MG capsule Take 30 mg by mouth daily at 12 noon.    . lidocaine-prilocaine (EMLA) cream Apply to affected area once 30 g 3  . LORazepam (ATIVAN) 0.5 MG tablet Take 1 tablet (0.5 mg total) by mouth at bedtime. 30 tablet 0  . montelukast (SINGULAIR) 10 MG tablet Take 10 mg by mouth at bedtime.     Vladimir Faster Glycol-Propyl Glycol (SYSTANE OP) Apply to eye.    . spironolactone (ALDACTONE) 50 MG tablet Take 50 mg by mouth 2 (two) times daily.     . Triamcinolone Acetonide (NASACORT ALLERGY 24HR NA) Place 1 spray into the nose daily.    . verapamil (VERELAN PM) 240 MG 24 hr capsule 240 mg.    . dexamethasone (DECADRON) 1 MG tablet Take 1 tablet (1 mg total) by mouth daily with breakfast. 30 tablet 0  . GARLIC PO Take by mouth daily.    Marland Kitchen guaiFENesin (MUCINEX) 600 MG 12 hr tablet Take 600 mg by mouth 2 (two) times daily as needed.    Marland Kitchen levocetirizine (XYZAL) 5 MG tablet Take 5 mg by mouth every evening.    . magic mouthwash w/lidocaine SOLN Take 5 mLs by mouth 3 (three) times daily as needed for mouth pain. (Patient not taking: Reported on 06/12/2016) 150 mL 1  . ondansetron (ZOFRAN) 8 MG tablet Take 1 tablet (8 mg total) by mouth 2 (two) times daily as needed for refractory nausea / vomiting. Start on day 3 after chemo. (Patient not taking: Reported on 06/12/2016) 30 tablet 1  . PREMARIN vaginal cream Place 1  Applicatorful vaginally 2 (two) times a week.     . prochlorperazine (COMPAZINE) 10 MG tablet Take 1 tablet (10 mg total) by mouth every 6 (six) hours as needed (Nausea or vomiting). (Patient not taking: Reported on 06/12/2016) 30 tablet 1   No current facility-administered medications for this visit.     PHYSICAL EXAMINATION: ECOG PERFORMANCE STATUS: 1 - Symptomatic but completely ambulatory  Vitals:   06/12/16 0934  BP: 124/71  Pulse: 95  Resp: 18  Temp: 97.9 F (36.6 C)   Filed Weights   06/12/16 0934  Weight: 158 lb 6.4 oz (71.8 kg)    GENERAL:alert, no distress and comfortable SKIN: skin color, texture, turgor are normal, no rashes or significant lesions, entire sole of left foot is erythematous, some dryness noted at heel and under the mtp joint., sole of right foot is erythematous  however, not as red as left foot EYES: normal, Conjunctiva are pink and non-injected, sclera clear, no excessive tearing noted during appointment OROPHARYNX:no exudate, no erythema and lips, buccal mucosa, and tongue normal  NECK: supple, thyroid normal size, non-tender, without nodularity LYMPH:  no palpable lymphadenopathy in the cervical, supraclavicular, infraclavicular LUNGS: clear to auscultation and percussion with normal breathing effort HEART: regular rate & rhythm and no murmurs and no lower extremity edema ABDOMEN:abdomen soft, non-tender and normal bowel sounds MUSCULOSKELETAL:no cyanosis of digits and no clubbing  NEURO: alert & oriented x 3 with fluent speech, no focal motor/sensory deficits EXTREMITIES: No lower extremity edema   LABORATORY DATA:  I have reviewed the data as listed   Chemistry      Component Value Date/Time   NA 141 05/24/2016 0815   K 4.1 05/24/2016 0815   CL 104 03/20/2016 0927   CO2 26 05/24/2016 0815   BUN 15.2 05/24/2016 0815   CREATININE 1.2 (H) 05/24/2016 0815      Component Value Date/Time   CALCIUM 9.9 05/24/2016 0815   ALKPHOS 151 (H)  05/24/2016 0815   AST 16 05/24/2016 0815   ALT 12 05/24/2016 0815   BILITOT 0.48 05/24/2016 0815       Lab Results  Component Value Date   WBC 3.8 (L) 05/24/2016   HGB 11.3 (L) 05/24/2016   HCT 34.5 (L) 05/24/2016   MCV 100.0 05/24/2016   PLT 195 05/24/2016   NEUTROABS 1.7 05/24/2016    ASSESSMENT & PLAN:  Malignant neoplasm of upper-outer quadrant of right female breast ([place]) Right breast biopsy 1:00, grade 2-3 IDC, ER 5%, PR 0%, Ki-67 90%, HER-2 positive ratio 7.28, right breast biopsy 1:00 1.5 cm medial to the dominant mass fibrocystic changes; ultrasound and mammogram 02/23/2016: 2.5 cm mass at 1:00 position and a small 18m irregular mass 1.5 cm medial to the larger mass, T2 N0 stage II a clinical stage  Breast MRI 03/04/2016: 2.9 cm irregular enhancing mass located within the right breast at 1:00 position, several T2 bright lesions in the right lobe of the liver incompletely visualized could be hepatic cysts.  Recommendationbased on multidisciplinary tumor board: 1. Neoadjuvant chemotherapy with TCH Perjeta 6 cycles followed by Herceptin maintenance for 1 year 2. Followed by breast conserving surgery if possible with sentinel lymph node study 3. Followed by adjuvant radiation therapy if patient had lumpectomy --------------------------------------------------------------------------------------------------------------------------------------------------------------- Current Treatment: cycle 4 day 20TCHP Plan: I talked with ARodena Pietyin detail about her symptoms.  She is using eucerin on her feet at night and she will continue this.  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  I discussed this with Dr. GLindi Adieand patient was prescribed Dexamethasone 115mdaily for the next couple of days.  Patient was hesitant to take this, but I offered her reassurance that it is a lower dose than the previous dexamethasone, and for a very short duration.  I  reviewed "hand-foot syndrome" with her in detail.  Patient will also use Systane eye gtts for her increase in lacrimation.  We talked about the possibility of lacrimal duct stenosis.  She may benefit from some restasis.  We also talked about her possibly needing to see an ophthalmologist in the future.  She is considering going ahead and scheduling an appointment since her ophthalmologist is difficult to get in with.  She will see Dr. GuLindi Adien two days before she is due for her fifth cycle of Taxotere, Carbo, Herceptin, Perjeta.  She  is due this month for her echocardiogram and MD visit with Dr. Aundra Dubin in cardio oncology.  I have sent a message to Kevan Rosebush in cardiology to help arrange this.  Nyah and I spoke in detail about the numbness in the pads of her fingers of her first two digits.  She is going to pay more attention to her motor skills of these fingers such as picking up small items, buttoning, etc.  Jalen knows to call us anytime between now and Wednesday for any questions or concerns.     I spent 25 minutes talking to the patient of which more than half was spent in counseling and coordination of care.  No orders of the defined types were placed in this encounter.  The patient has a good understanding of the overall plan. she agrees with it. she will call with any problems that may develop before the next visit here.  Brenda Massed, NP 06/12/16

## 2016-06-14 ENCOUNTER — Other Ambulatory Visit (HOSPITAL_BASED_OUTPATIENT_CLINIC_OR_DEPARTMENT_OTHER): Payer: Medicare FFS

## 2016-06-14 ENCOUNTER — Ambulatory Visit (HOSPITAL_BASED_OUTPATIENT_CLINIC_OR_DEPARTMENT_OTHER): Payer: Medicare FFS

## 2016-06-14 ENCOUNTER — Ambulatory Visit (HOSPITAL_BASED_OUTPATIENT_CLINIC_OR_DEPARTMENT_OTHER): Payer: Medicare FFS | Admitting: Hematology and Oncology

## 2016-06-14 ENCOUNTER — Encounter: Payer: Self-pay | Admitting: *Deleted

## 2016-06-14 ENCOUNTER — Encounter: Payer: Self-pay | Admitting: Hematology and Oncology

## 2016-06-14 DIAGNOSIS — C50411 Malignant neoplasm of upper-outer quadrant of right female breast: Secondary | ICD-10-CM

## 2016-06-14 DIAGNOSIS — D6481 Anemia due to antineoplastic chemotherapy: Secondary | ICD-10-CM | POA: Diagnosis not present

## 2016-06-14 DIAGNOSIS — Z5112 Encounter for antineoplastic immunotherapy: Secondary | ICD-10-CM | POA: Diagnosis not present

## 2016-06-14 DIAGNOSIS — Z5111 Encounter for antineoplastic chemotherapy: Secondary | ICD-10-CM | POA: Diagnosis not present

## 2016-06-14 DIAGNOSIS — Z170 Estrogen receptor positive status [ER+]: Secondary | ICD-10-CM | POA: Diagnosis not present

## 2016-06-14 DIAGNOSIS — L271 Localized skin eruption due to drugs and medicaments taken internally: Secondary | ICD-10-CM

## 2016-06-14 LAB — COMPREHENSIVE METABOLIC PANEL
ALT: 12 U/L (ref 0–55)
AST: 17 U/L (ref 5–34)
Albumin: 3.8 g/dL (ref 3.5–5.0)
Alkaline Phosphatase: 100 U/L (ref 40–150)
Anion Gap: 10 mEq/L (ref 3–11)
BUN: 13.1 mg/dL (ref 7.0–26.0)
CO2: 24 mEq/L (ref 22–29)
Calcium: 9.3 mg/dL (ref 8.4–10.4)
Chloride: 107 mEq/L (ref 98–109)
Creatinine: 1.1 mg/dL (ref 0.6–1.1)
EGFR: 57 mL/min/{1.73_m2} — ABNORMAL LOW (ref >90)
Glucose: 91 mg/dl (ref 70–140)
Potassium: 3.7 mEq/L (ref 3.5–5.1)
Sodium: 141 mEq/L (ref 136–145)
Total Bilirubin: 0.45 mg/dL (ref 0.20–1.20)
Total Protein: 6.7 g/dL (ref 6.4–8.3)

## 2016-06-14 LAB — CBC WITH DIFFERENTIAL/PLATELET
BASO%: 0.4 % (ref 0.0–2.0)
Basophils Absolute: 0 10*3/uL (ref 0.0–0.1)
EOS%: 0 % (ref 0.0–7.0)
Eosinophils Absolute: 0 10*3/uL (ref 0.0–0.5)
HCT: 29.5 % — ABNORMAL LOW (ref 34.8–46.6)
HGB: 10.1 g/dL — ABNORMAL LOW (ref 11.6–15.9)
LYMPH%: 23 % (ref 14.0–49.7)
MCH: 34.6 pg — ABNORMAL HIGH (ref 25.1–34.0)
MCHC: 34.2 g/dL (ref 31.5–36.0)
MCV: 101.3 fL — ABNORMAL HIGH (ref 79.5–101.0)
MONO#: 0.8 10*3/uL (ref 0.1–0.9)
MONO%: 12.7 % (ref 0.0–14.0)
NEUT#: 3.9 10*3/uL (ref 1.5–6.5)
NEUT%: 63.9 % (ref 38.4–76.8)
Platelets: 174 10*3/uL (ref 145–400)
RBC: 2.91 10*6/uL — ABNORMAL LOW (ref 3.70–5.45)
RDW: 20.9 % — ABNORMAL HIGH (ref 11.2–14.5)
WBC: 6.1 10*3/uL (ref 3.9–10.3)
lymph#: 1.4 10*3/uL (ref 0.9–3.3)

## 2016-06-14 MED ORDER — SODIUM CHLORIDE 0.9 % IV SOLN
420.0000 mg | Freq: Once | INTRAVENOUS | Status: AC
  Administered 2016-06-14: 420 mg via INTRAVENOUS
  Filled 2016-06-14: qty 14

## 2016-06-14 MED ORDER — PALONOSETRON HCL INJECTION 0.25 MG/5ML
INTRAVENOUS | Status: AC
  Filled 2016-06-14: qty 5

## 2016-06-14 MED ORDER — SODIUM CHLORIDE 0.9 % IV SOLN
Freq: Once | INTRAVENOUS | Status: AC
  Administered 2016-06-14: 10:00:00 via INTRAVENOUS

## 2016-06-14 MED ORDER — TRASTUZUMAB CHEMO 150 MG IV SOLR
6.0000 mg/kg | Freq: Once | INTRAVENOUS | Status: AC
  Administered 2016-06-14: 441 mg via INTRAVENOUS
  Filled 2016-06-14: qty 21

## 2016-06-14 MED ORDER — DIPHENHYDRAMINE HCL 25 MG PO CAPS
50.0000 mg | ORAL_CAPSULE | Freq: Once | ORAL | Status: AC
Start: 2016-06-14 — End: 2016-06-14
  Administered 2016-06-14: 50 mg via ORAL

## 2016-06-14 MED ORDER — ACETAMINOPHEN 325 MG PO TABS
ORAL_TABLET | ORAL | Status: AC
  Filled 2016-06-14: qty 2

## 2016-06-14 MED ORDER — DIPHENHYDRAMINE HCL 25 MG PO CAPS
ORAL_CAPSULE | ORAL | Status: AC
  Filled 2016-06-14: qty 2

## 2016-06-14 MED ORDER — ACETAMINOPHEN 325 MG PO TABS
650.0000 mg | ORAL_TABLET | Freq: Once | ORAL | Status: AC
  Administered 2016-06-14: 650 mg via ORAL

## 2016-06-14 MED ORDER — PALONOSETRON HCL INJECTION 0.25 MG/5ML
0.2500 mg | Freq: Once | INTRAVENOUS | Status: AC
Start: 2016-06-14 — End: 2016-06-14
  Administered 2016-06-14: 0.25 mg via INTRAVENOUS

## 2016-06-14 MED ORDER — SODIUM CHLORIDE 0.9% FLUSH
10.0000 mL | INTRAVENOUS | Status: DC | PRN
  Administered 2016-06-14: 10 mL
  Filled 2016-06-14: qty 10

## 2016-06-14 MED ORDER — HEPARIN SOD (PORK) LOCK FLUSH 100 UNIT/ML IV SOLN
500.0000 [IU] | Freq: Once | INTRAVENOUS | Status: AC | PRN
  Administered 2016-06-14: 500 [IU]
  Filled 2016-06-14: qty 5

## 2016-06-14 MED ORDER — SODIUM CHLORIDE 0.9 % IV SOLN
Freq: Once | INTRAVENOUS | Status: AC
  Administered 2016-06-14: 10:00:00 via INTRAVENOUS
  Filled 2016-06-14: qty 5

## 2016-06-14 MED ORDER — SODIUM CHLORIDE 0.9 % IV SOLN
430.0000 mg | Freq: Once | INTRAVENOUS | Status: AC
  Administered 2016-06-14: 430 mg via INTRAVENOUS
  Filled 2016-06-14: qty 43

## 2016-06-14 NOTE — Assessment & Plan Note (Addendum)
Right breast biopsy 1:00, grade 2-3 IDC, ER 5%, PR 0%, Ki-67 90%, HER-2 positive ratio 7.28, right breast biopsy 1:00 1.5 cm medial to the dominant mass fibrocystic changes; ultrasound and mammogram 02/23/2016: 2.5 cm mass at 1:00 position and a small 8mm irregular mass 1.5 cm medial to the larger mass, T2 N0 stage II a clinical stage  Breast MRI 03/04/2016: 2.9 cm irregular enhancing mass located within the right breast at 1:00 position, several T2 bright lesions in the right lobe of the liver incompletely visualized could be hepatic cysts.  Recommendationbased on multidisciplinary tumor board: 1. Neoadjuvant chemotherapy with TCH Perjeta 6 cycles followed by Herceptin maintenance for 1 year 2. Followed by breast conserving surgery if possible with sentinel lymph node study 3. Followed by adjuvant radiation therapy if patient had lumpectomy --------------------------------------------------------------------------------------------------------------------------------------------------------------- Current Treatment: cycle 5day 1TCHP Chemo Toxicities: 1. Severe thrush and mucositis:Prev treated with 200 mg daily of the Diflucan and Magic mouthwash. D/Cdexamethasone.  2.severe fatigue : Being monitored closely 3. Nausea due to chemotherapy  We may consider lowering the dosage of next chemotherapy depending on her blood counts on her next cycle.  4. Dehydration: Patient is pushing oral fluids aggressively to prevent dehydration 5. Fevers:Previously treated with Levaquin. 6 Chemotherapy induced anemia: Monitoring.  7. Diarrhea due to Perjeta: Well controlled with Lomotil and Imodium. 8. Itching of the feet: It is unclear if this is neuropathy related. It does not persist. So we elected to continue with the same dosage. If the symptoms get worse then we may have to discontinue Taxotere.   9. 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 I would discontinue Taxotere with cycle 5. If the  symptoms persist then we may have to discontinue carboplatin as well.  RTC in 3 weeks for cycle 6 

## 2016-06-14 NOTE — Patient Instructions (Addendum)
[facility] Discharge Instructions for Patients Receiving Chemotherapy  Today you received the following chemotherapy agents Herceptin, Perjeta, and Carboplatin   To help prevent nausea and vomiting after your treatment, we encourage you to take your nausea medication as directed. No Zofran for 3 days. Take Compazine instead.     If you develop nausea and vomiting that is not controlled by your nausea medication, call the clinic.   BELOW ARE SYMPTOMS THAT SHOULD BE REPORTED IMMEDIATELY:  *FEVER GREATER THAN 100.5 F  *CHILLS WITH OR WITHOUT FEVER  NAUSEA AND VOMITING THAT IS NOT CONTROLLED WITH YOUR NAUSEA MEDICATION  *UNUSUAL SHORTNESS OF BREATH  *UNUSUAL BRUISING OR BLEEDING  TENDERNESS IN MOUTH AND THROAT WITH OR WITHOUT PRESENCE OF ULCERS  *URINARY PROBLEMS  *BOWEL PROBLEMS  UNUSUAL RASH Items with * indicate a potential emergency and should be followed up as soon as possible.  Feel free to call the clinic you have any questions or concerns. The clinic phone number is [phone].  Please show the [facility] at check-in to the Emergency Department and triage nurse.

## 2016-06-14 NOTE — Progress Notes (Signed)
 Patient Care Team: Pradeep K Pradhan, MD as PCP - General (Internal Medicine) W Ronald Neal, MD as Consulting Physician (Obstetrics and Gynecology) Faera Byerly, MD as Consulting Physician (General Surgery) Vinay Gudena, MD as Consulting Physician (Hematology and Oncology) John Moody, MD as Consulting Physician (Radiation Oncology)  DIAGNOSIS:  Encounter Diagnosis  Name Primary?  . Malignant neoplasm of upper-outer quadrant of right breast in female, estrogen receptor positive (HCC)     SUMMARY OF ONCOLOGIC HISTORY:   Malignant neoplasm of upper-outer quadrant of right female breast (HCC)   02/24/2016 Initial Diagnosis    Right breast biopsy 1:00, grade 2-3 IDC, ER 5%, PR 0%, Ki-67 90%, HER-2 positive ratio 7.28, right breast biopsy 1:00 1.5 cm medial to the dominant mass fibrocystic changes; ultrasound and mammogram 02/23/2016: 2.5 cm mass at 1:00 position and a small 8mm irregular mass 1.5 cm medial to the larger mass, T2 N0 stage II a clinical stage      03/04/2016 Breast MRI    2.9 cm irregular enhancing mass located within the right breast at 1:00 position, several T2 bright lesions in the right lobe of the liver incompletely visualized could be hepatic cysts      03/22/2016 -  Neo-Adjuvant Chemotherapy    Neo-Adj TCHP       CHIEF COMPLIANT: Hand-foot syndrome, cycle 5 TCH Perjeta  INTERVAL HISTORY: Brenda Harding is a 61-year-old with above-mentioned history of right breast cancer currently on neoadjuvant chemotherapy. Today is cycle 5 of TCH Perjeta. She came in for an urgent visit last week because of worsening redness and discomfort in the hands and feet. She was diagnosed with hand-foot syndrome. She was prescribed topical creams and ointments. She is here to discuss the treatment plan. She continues to have severe fatigue  And severe diarrhea. Denies any nausea vomiting.  REVIEW OF SYSTEMS:   Constitutional: Denies fevers, chills or abnormal weight loss Eyes: Denies  blurriness of vision Ears, nose, mouth, throat, and face: Denies mucositis or sore throat Respiratory: Denies cough, dyspnea or wheezes Cardiovascular: Denies palpitation, chest discomfort Gastrointestinal:  dairrhea Skin: Denies abnormal skin rashes Lymphatics: Denies new lymphadenopathy or easy bruising Neurological:Denies numbness, tingling or new weaknesses Behavioral/Psych: Mood is stable, no new changes  Extremities: Redness of hands and feet and discomfort Breast:  denies any pain or lumps or nodules in either breasts All other systems were reviewed with the patient and are negative.  I have reviewed the past medical history, past surgical history, social history and family history with the patient and they are unchanged from previous note.  ALLERGIES:  is allergic to inapsine [droperidol] and propoxyphene.  MEDICATIONS:  Current Outpatient Prescriptions  Medication Sig Dispense Refill  . albuterol (PROVENTIL HFA;VENTOLIN HFA) 108 (90 Base) MCG/ACT inhaler Inhale 2 puffs into the lungs every 6 (six) hours as needed for wheezing or shortness of breath.    . ALPRAZolam (XANAX) 0.5 MG tablet Take 1 tablet (0.5 mg total) by mouth 2 (two) times daily as needed. 60 tablet 2  . betamethasone dipropionate (DIPROLENE) 0.05 % cream Apply topically 3 (three) times a week.    . Cholecalciferol (VITAMIN D3 PO) Take 2,500 mg by mouth 2 (two) times a week.     . dexamethasone (DECADRON) 1 MG tablet Take 1 tablet (1 mg total) by mouth daily with breakfast. 30 tablet 0  . diphenoxylate-atropine (LOMOTIL) 2.5-0.025 MG tablet Take 1 tablet by mouth 4 (four) times daily as needed for diarrhea or loose stools. 30 tablet 0  .   GARLIC PO Take by mouth daily.    Marland Kitchen guaiFENesin (MUCINEX) 600 MG 12 hr tablet Take 600 mg by mouth 2 (two) times daily as needed.    . hydrOXYzine (ATARAX/VISTARIL) 25 MG tablet Take 1 tablet (25 mg total) by mouth 3 (three) times daily as needed. 60 tablet 3  . ibuprofen  (ADVIL,MOTRIN) 800 MG tablet 800 mg every 8 (eight) hours as needed for mild pain.     Marland Kitchen lansoprazole (PREVACID) 30 MG capsule Take 30 mg by mouth daily at 12 noon.    Marland Kitchen levocetirizine (XYZAL) 5 MG tablet Take 5 mg by mouth every evening.    . lidocaine-prilocaine (EMLA) cream Apply to affected area once 30 g 3  . LORazepam (ATIVAN) 0.5 MG tablet Take 1 tablet (0.5 mg total) by mouth at bedtime. 30 tablet 0  . magic mouthwash w/lidocaine SOLN Take 5 mLs by mouth 3 (three) times daily as needed for mouth pain. (Patient not taking: Reported on 06/12/2016) 150 mL 1  . montelukast (SINGULAIR) 10 MG tablet Take 10 mg by mouth at bedtime.     . ondansetron (ZOFRAN) 8 MG tablet Take 1 tablet (8 mg total) by mouth 2 (two) times daily as needed for refractory nausea / vomiting. Start on day 3 after chemo. (Patient not taking: Reported on 06/12/2016) 30 tablet 1  . Polyethyl Glycol-Propyl Glycol (SYSTANE OP) Apply to eye.    Marland Kitchen PREMARIN vaginal cream Place 1 Applicatorful vaginally 2 (two) times a week.     . prochlorperazine (COMPAZINE) 10 MG tablet Take 1 tablet (10 mg total) by mouth every 6 (six) hours as needed (Nausea or vomiting). (Patient not taking: Reported on 06/12/2016) 30 tablet 1  . spironolactone (ALDACTONE) 50 MG tablet Take 50 mg by mouth 2 (two) times daily.     . Triamcinolone Acetonide (NASACORT ALLERGY 24HR NA) Place 1 spray into the nose daily.    . verapamil (VERELAN PM) 240 MG 24 hr capsule 240 mg.     No current facility-administered medications for this visit.     PHYSICAL EXAMINATION: ECOG PERFORMANCE STATUS: 2 - Symptomatic, <50% confined to bed  Vitals:   06/14/16 0855  BP: 138/75  Pulse: 99  Resp: 18  Temp: 98.5 F (36.9 C)   Filed Weights   06/14/16 0855  Weight: 158 lb 11.2 oz (72 kg)    GENERAL:alert, no distress and comfortable SKIN: skin color, texture, turgor are normal, no rashes or significant lesions EYES: normal, Conjunctiva are pink and non-injected,  sclera clear OROPHARYNX:no exudate, no erythema and lips, buccal mucosa, and tongue normal  NECK: supple, thyroid normal size, non-tender, without nodularity LYMPH:  no palpable lymphadenopathy in the cervical, axillary or inguinal LUNGS: clear to auscultation and percussion with normal breathing effort HEART: regular rate & rhythm and no murmurs and no lower extremity edema ABDOMEN:abdomen soft, non-tender and normal bowel sounds MUSCULOSKELETAL:no cyanosis of digits and no clubbing  NEURO: alert & oriented x 3 with fluent speech, no focal motor/sensory deficits EXTREMITIES: Redness in hands and feet  LABORATORY DATA:  I have reviewed the data as listed   Chemistry      Component Value Date/Time   NA 141 05/24/2016 0815   K 4.1 05/24/2016 0815   CL 104 03/20/2016 0927   CO2 26 05/24/2016 0815   BUN 15.2 05/24/2016 0815   CREATININE 1.2 (H) 05/24/2016 0815      Component Value Date/Time   CALCIUM 9.9 05/24/2016 0815   ALKPHOS 151 (H)  05/24/2016 0815   AST 16 05/24/2016 0815   ALT 12 05/24/2016 0815   BILITOT 0.48 05/24/2016 0815       Lab Results  Component Value Date   WBC 6.1 06/14/2016   HGB 10.1 (L) 06/14/2016   HCT 29.5 (L) 06/14/2016   MCV 101.3 (H) 06/14/2016   PLT 174 06/14/2016   NEUTROABS 3.9 06/14/2016    ASSESSMENT & PLAN:  Malignant neoplasm of upper-outer quadrant of right female breast ([place]) Right breast biopsy 1:00, grade 2-3 IDC, ER 5%, PR 0%, Ki-67 90%, HER-2 positive ratio 7.28, right breast biopsy 1:00 1.5 cm medial to the dominant mass fibrocystic changes; ultrasound and mammogram 02/23/2016: 2.5 cm mass at 1:00 position and a small 81m irregular mass 1.5 cm medial to the larger mass, T2 N0 stage II a clinical stage  Breast MRI 03/04/2016: 2.9 cm irregular enhancing mass located within the right breast at 1:00 position, several T2 bright lesions in the right lobe of the liver incompletely visualized could be hepatic cysts.  Recommendationbased  on multidisciplinary tumor board: 1. Neoadjuvant chemotherapy with TCH Perjeta 6 cycles followed by Herceptin maintenance for 1 year 2. Followed by breast conserving surgery if possible with sentinel lymph node study 3. Followed by adjuvant radiation therapy if patient had lumpectomy --------------------------------------------------------------------------------------------------------------------------------------------------------------- Current Treatment: cycle 5day 1TCHP (Will discontinue Taxotere with cycle 5) Chemo Toxicities: 1. Severe thrush and mucositis:Prev treated with 200 mg daily of the Diflucan and Magic mouthwash. D/Cdexamethasone.  2.severe fatigue : Being monitored closely 3. Nausea due to chemotherapy  We may consider lowering the dosage of next chemotherapy depending on her blood counts on her next cycle.  4. Dehydration:  5. Chemotherapy induced anemia: Monitoring.  6. Diarrhea due to Perjeta: Persistent in spite of Lomotil and Imodium. We will see if it gets better since Taxotere is being discontinued. 8. 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 I would discontinue Taxotere with cycle 5. If the symptoms persist then we may have to discontinue carboplatin as well.  RTC in 3 weeks for cycle 6  I spent 25 minutes talking to the patient of which more than half was spent in counseling and coordination of care.  Orders Placed This Encounter  Procedures  . MR BREAST BILATERAL W WO CONTRAST    Standing Status:   Future    Standing Expiration Date:   08/14/2017    Order Specific Question:   If indicated for the ordered procedure, I authorize the administration of contrast media per Radiology protocol    Answer:   Yes    Order Specific Question:   Reason for Exam (SYMPTOM  OR DIAGNOSIS REQUIRED)    Answer:   Breast cancer: Post neoadjuvant chemo response to treatment    Order Specific Question:   Preferred imaging location?    Answer:   [facility]  (table limit-550lbs)    Order Specific Question:   Does the patient have a pacemaker or implanted devices?    Answer:   No    Order Specific Question:   What is the patient's sedation requirement?    Answer:   No Sedation   The patient has a good understanding of the overall plan. she agrees with it. she will call with any problems that may develop before the next visit here.   GRulon Eisenmenger MD 06/14/16

## 2016-06-23 ENCOUNTER — Other Ambulatory Visit: Payer: Self-pay | Admitting: *Deleted

## 2016-06-23 DIAGNOSIS — C50411 Malignant neoplasm of upper-outer quadrant of right female breast: Secondary | ICD-10-CM

## 2016-06-23 MED ORDER — DIPHENOXYLATE-ATROPINE 2.5-0.025 MG PO TABS: 1.0000 | ORAL_TABLET | Freq: Four times a day (QID) | ORAL | 0 refills | Status: DC | PRN

## 2016-06-27 ENCOUNTER — Other Ambulatory Visit (HOSPITAL_COMMUNITY): Payer: Medicare FFS

## 2016-06-27 ENCOUNTER — Encounter (HOSPITAL_COMMUNITY): Payer: Medicare FFS

## 2016-07-05 ENCOUNTER — Encounter: Payer: Self-pay | Admitting: *Deleted

## 2016-07-05 ENCOUNTER — Ambulatory Visit (HOSPITAL_BASED_OUTPATIENT_CLINIC_OR_DEPARTMENT_OTHER): Payer: Medicare FFS | Admitting: Hematology and Oncology

## 2016-07-05 ENCOUNTER — Other Ambulatory Visit (HOSPITAL_BASED_OUTPATIENT_CLINIC_OR_DEPARTMENT_OTHER): Payer: Medicare FFS

## 2016-07-05 ENCOUNTER — Encounter: Payer: Self-pay | Admitting: Hematology and Oncology

## 2016-07-05 ENCOUNTER — Ambulatory Visit (HOSPITAL_BASED_OUTPATIENT_CLINIC_OR_DEPARTMENT_OTHER): Payer: Medicare FFS

## 2016-07-05 DIAGNOSIS — D6481 Anemia due to antineoplastic chemotherapy: Secondary | ICD-10-CM

## 2016-07-05 DIAGNOSIS — C50411 Malignant neoplasm of upper-outer quadrant of right female breast: Secondary | ICD-10-CM | POA: Diagnosis not present

## 2016-07-05 DIAGNOSIS — Z5111 Encounter for antineoplastic chemotherapy: Secondary | ICD-10-CM | POA: Diagnosis not present

## 2016-07-05 DIAGNOSIS — Z170 Estrogen receptor positive status [ER+]: Secondary | ICD-10-CM | POA: Diagnosis not present

## 2016-07-05 DIAGNOSIS — D701 Agranulocytosis secondary to cancer chemotherapy: Secondary | ICD-10-CM | POA: Diagnosis not present

## 2016-07-05 DIAGNOSIS — D6959 Other secondary thrombocytopenia: Secondary | ICD-10-CM | POA: Diagnosis not present

## 2016-07-05 DIAGNOSIS — K769 Liver disease, unspecified: Secondary | ICD-10-CM

## 2016-07-05 LAB — COMPREHENSIVE METABOLIC PANEL
ALT: 10 U/L (ref 0–55)
AST: 16 U/L (ref 5–34)
Albumin: 4 g/dL (ref 3.5–5.0)
Alkaline Phosphatase: 166 U/L — ABNORMAL HIGH (ref 40–150)
Anion Gap: 10 mEq/L (ref 3–11)
BUN: 12.2 mg/dL (ref 7.0–26.0)
CO2: 25 mEq/L (ref 22–29)
Calcium: 9 mg/dL (ref 8.4–10.4)
Chloride: 108 mEq/L (ref 98–109)
Creatinine: 1 mg/dL (ref 0.6–1.1)
EGFR: 62 mL/min/{1.73_m2} — ABNORMAL LOW (ref >90)
Glucose: 92 mg/dl (ref 70–140)
Potassium: 3.7 mEq/L (ref 3.5–5.1)
Sodium: 143 mEq/L (ref 136–145)
Total Bilirubin: 0.59 mg/dL (ref 0.20–1.20)
Total Protein: 6.5 g/dL (ref 6.4–8.3)

## 2016-07-05 LAB — CBC WITH DIFFERENTIAL/PLATELET
BASO%: 0 % (ref 0.0–2.0)
Basophils Absolute: 0 10*3/uL (ref 0.0–0.1)
EOS%: 0.5 % (ref 0.0–7.0)
Eosinophils Absolute: 0 10*3/uL (ref 0.0–0.5)
HCT: 26.8 % — ABNORMAL LOW (ref 34.8–46.6)
HGB: 9 g/dL — ABNORMAL LOW (ref 11.6–15.9)
LYMPH%: 55.1 % — ABNORMAL HIGH (ref 14.0–49.7)
MCH: 35.7 pg — ABNORMAL HIGH (ref 25.1–34.0)
MCHC: 33.6 g/dL (ref 31.5–36.0)
MCV: 106.3 fL — ABNORMAL HIGH (ref 79.5–101.0)
MONO#: 0.2 10*3/uL (ref 0.1–0.9)
MONO%: 7.8 % (ref 0.0–14.0)
NEUT#: 0.8 10*3/uL — ABNORMAL LOW (ref 1.5–6.5)
NEUT%: 36.6 % — ABNORMAL LOW (ref 38.4–76.8)
Platelets: 57 10*3/uL — ABNORMAL LOW (ref 145–400)
RBC: 2.52 10*6/uL — ABNORMAL LOW (ref 3.70–5.45)
RDW: 17.5 % — ABNORMAL HIGH (ref 11.2–14.5)
WBC: 2.1 10*3/uL — ABNORMAL LOW (ref 3.9–10.3)
lymph#: 1.1 10*3/uL (ref 0.9–3.3)
nRBC: 0 % (ref 0–0)

## 2016-07-05 MED ORDER — SODIUM CHLORIDE 0.9 % IV SOLN
Freq: Once | INTRAVENOUS | Status: AC
  Administered 2016-07-05: 10:00:00 via INTRAVENOUS

## 2016-07-05 MED ORDER — HEPARIN SOD (PORK) LOCK FLUSH 100 UNIT/ML IV SOLN
500.0000 [IU] | Freq: Once | INTRAVENOUS | Status: AC | PRN
  Administered 2016-07-05: 500 [IU]
  Filled 2016-07-05: qty 5

## 2016-07-05 MED ORDER — SODIUM CHLORIDE 0.9 % IV SOLN
420.0000 mg | Freq: Once | INTRAVENOUS | Status: AC
  Administered 2016-07-05: 420 mg via INTRAVENOUS
  Filled 2016-07-05: qty 14

## 2016-07-05 MED ORDER — DIPHENHYDRAMINE HCL 25 MG PO CAPS
ORAL_CAPSULE | ORAL | Status: AC
  Filled 2016-07-05: qty 2

## 2016-07-05 MED ORDER — SODIUM CHLORIDE 0.9% FLUSH
10.0000 mL | INTRAVENOUS | Status: DC | PRN
  Administered 2016-07-05: 10 mL
  Filled 2016-07-05: qty 10

## 2016-07-05 MED ORDER — ACETAMINOPHEN 325 MG PO TABS
650.0000 mg | ORAL_TABLET | Freq: Once | ORAL | Status: AC
  Administered 2016-07-05: 650 mg via ORAL

## 2016-07-05 MED ORDER — ACETAMINOPHEN 325 MG PO TABS
ORAL_TABLET | ORAL | Status: AC
  Filled 2016-07-05: qty 2

## 2016-07-05 MED ORDER — DIPHENHYDRAMINE HCL 25 MG PO CAPS
50.0000 mg | ORAL_CAPSULE | Freq: Once | ORAL | Status: AC
  Administered 2016-07-05: 50 mg via ORAL

## 2016-07-05 MED ORDER — SODIUM CHLORIDE 0.9 % IV SOLN
6.0000 mg/kg | Freq: Once | INTRAVENOUS | Status: AC
  Administered 2016-07-05: 441 mg via INTRAVENOUS
  Filled 2016-07-05: qty 21

## 2016-07-05 NOTE — Assessment & Plan Note (Signed)
Right breast biopsy 1:00, grade 2-3 IDC, ER 5%, PR 0%, Ki-67 90%, HER-2 positive ratio 7.28, right breast biopsy 1:00 1.5 cm medial to the dominant mass fibrocystic changes; ultrasound and mammogram 02/23/2016: 2.5 cm mass at 1:00 position and a small 18m irregular mass 1.5 cm medial to the larger mass, T2 N0 stage II a clinical stage  Breast MRI 03/04/2016: 2.9 cm irregular enhancing mass located within the right breast at 1:00 position, several T2 bright lesions in the right lobe of the liver incompletely visualized could be hepatic cysts.  Recommendationbased on multidisciplinary tumor board: 1. Neoadjuvant chemotherapy with TCH Perjeta 6 cycles followed by Herceptin maintenance for 1 year 2. Followed by breast conserving surgery if possible with sentinel lymph node study 3. Followed by adjuvant radiation therapy if patient had lumpectomy --------------------------------------------------------------------------------------------------------------------------------------------------------------- Current Treatment: cycle 6day 1TCHP (Will discontinue Taxotere with cycle 5) Chemo Toxicities: 1. Severe thrush and mucositis:Prev treated with 200 mg daily of the Diflucan and Magic mouthwash. D/Cdexamethasone.  2.severe fatigue : Being monitored closely 3. Nausea due to chemotherapy  We may consider lowering the dosage of next chemotherapy depending on her blood counts on her next cycle.  4. Dehydration:  5. Chemotherapy induced anemia: Monitoring.  6.Diarrhea due to Perjeta: Persistent in spite of Lomotil and Imodium. We will see if it gets better since Taxotere is being discontinued. 8. Hand-foot syndrome: related to Taxotere. We discontinued Taxotere with cycle 5.   Breast MRI has been set up for 07/07/2016 follow-up on 07/12/2016 to discuss tumor board recommendation patient has an appointment to see surgery as well subsequently she will be continued on Herceptin encouragement and  maintenance RTC after breast MRI to discuss the result

## 2016-07-05 NOTE — Progress Notes (Signed)
Patient Care Team: Tobe Sos, MD as PCP - General (Internal Medicine) Viona Gilmore Evette Cristal, MD as Consulting Physician (Obstetrics and Gynecology) [name], MD as Consulting Physician (General Surgery) Nicholas Lose, MD as Consulting Physician (Hematology and Oncology) Kyung Rudd, MD as Consulting Physician (Radiation Oncology)  DIAGNOSIS:  Encounter Diagnosis  Name Primary?  . Malignant neoplasm of upper-outer quadrant of right breast in female, estrogen receptor positive ([place])     SUMMARY OF ONCOLOGIC HISTORY:   Malignant neoplasm of upper-outer quadrant of right female breast ([place])   02/24/2016 Initial Diagnosis    Right breast biopsy 1:00, grade 2-3 IDC, ER 5%, PR 0%, Ki-67 90%, HER-2 positive ratio 7.28, right breast biopsy 1:00 1.5 cm medial to the dominant mass fibrocystic changes; ultrasound and mammogram 02/23/2016: 2.5 cm mass at 1:00 position and a small 67m irregular mass 1.5 cm medial to the larger mass, T2 N0 stage II a clinical stage      03/04/2016 Breast MRI    2.9 cm irregular enhancing mass located within the right breast at 1:00 position, several T2 bright lesions in the right lobe of the liver incompletely visualized could be hepatic cysts      03/22/2016 -  Neo-Adjuvant Chemotherapy    Neo-Adj TCHP       CHIEF COMPLIANT: Cycle 6 chemotherapy, patient receiving carboplatin Herceptin and Perjeta since Taxotere was discontinued due to hand-foot syndrome  INTERVAL HISTORY: Brenda GUNNARSONis a 61year old with above-mentioned history of right breast cancer currently on neoadjuvant chemotherapy and today is cycle 6 of chemotherapy. She had hand-foot syndrome and Taxotere was discontinued. The hand-foot syndrome has resolved. She has nail discoloration. She feels that some of the nails may break loose. She did not receive Neulasta with the last chemotherapy.  REVIEW OF SYSTEMS:   Constitutional: Denies fevers, chills or abnormal weight loss Eyes: Denies  blurriness of vision Ears, nose, mouth, throat, and face: Denies mucositis or sore throat Respiratory: Denies cough, dyspnea or wheezes Cardiovascular: Denies palpitation, chest discomfort Gastrointestinal:  Denies nausea, heartburn or change in bowel habits Skin: Denies abnormal skin rashes Lymphatics: Denies new lymphadenopathy or easy bruising Neurological: Mild numbness in one or 2 fingers, nail discoloration Behavioral/Psych: Mood is stable, no new changes  Extremities: No lower extremity edema Breast:  denies any pain or lumps or nodules in either breasts All other systems were reviewed with the patient and are negative.  I have reviewed the past medical history, past surgical history, social history and family history with the patient and they are unchanged from previous note.  ALLERGIES:  is allergic to inapsine [droperidol] and propoxyphene.  MEDICATIONS:  Current Outpatient Prescriptions  Medication Sig Dispense Refill  . albuterol (PROVENTIL HFA;VENTOLIN HFA) 108 (90 Base) MCG/ACT inhaler Inhale 2 puffs into the lungs every 6 (six) hours as needed for wheezing or shortness of breath.    . ALPRAZolam (XANAX) 0.5 MG tablet Take 1 tablet (0.5 mg total) by mouth 2 (two) times daily as needed. 60 tablet 2  . betamethasone dipropionate (DIPROLENE) 0.05 % cream Apply topically 3 (three) times a week.    . Cholecalciferol (VITAMIN D3 PO) Take 2,500 mg by mouth 2 (two) times a week.     . diphenoxylate-atropine (LOMOTIL) 2.5-0.025 MG tablet Take 1 tablet by mouth 4 (four) times daily as needed for diarrhea or loose stools. 30 tablet 0  . GARLIC PO Take by mouth daily.    .Marland KitchenguaiFENesin (MUCINEX) 600 MG 12 hr tablet Take 600  mg by mouth 2 (two) times daily as needed.    . hydrOXYzine (ATARAX/VISTARIL) 25 MG tablet Take 1 tablet (25 mg total) by mouth 3 (three) times daily as needed. 60 tablet 3  . ibuprofen (ADVIL,MOTRIN) 800 MG tablet 800 mg every 8 (eight) hours as needed for mild pain.      Marland Kitchen lansoprazole (PREVACID) 30 MG capsule Take 30 mg by mouth daily at 12 noon.    Marland Kitchen levocetirizine (XYZAL) 5 MG tablet Take 5 mg by mouth every evening.    Marland Kitchen LORazepam (ATIVAN) 0.5 MG tablet Take 1 tablet (0.5 mg total) by mouth at bedtime. 30 tablet 0  . magic mouthwash w/lidocaine SOLN Take 5 mLs by mouth 3 (three) times daily as needed for mouth pain. (Patient not taking: Reported on 06/12/2016) 150 mL 1  . montelukast (SINGULAIR) 10 MG tablet Take 10 mg by mouth at bedtime.     . ondansetron (ZOFRAN) 8 MG tablet Take 1 tablet (8 mg total) by mouth 2 (two) times daily as needed for refractory nausea / vomiting. Start on day 3 after chemo. (Patient not taking: Reported on 06/12/2016) 30 tablet 1  . Polyethyl Glycol-Propyl Glycol (SYSTANE OP) Apply to eye.    Marland Kitchen PREMARIN vaginal cream Place 1 Applicatorful vaginally 2 (two) times a week.     . prochlorperazine (COMPAZINE) 10 MG tablet Take 1 tablet (10 mg total) by mouth every 6 (six) hours as needed (Nausea or vomiting). (Patient not taking: Reported on 06/12/2016) 30 tablet 1  . spironolactone (ALDACTONE) 50 MG tablet Take 50 mg by mouth 2 (two) times daily.     . Triamcinolone Acetonide (NASACORT ALLERGY 24HR NA) Place 1 spray into the nose daily.    . verapamil (VERELAN PM) 240 MG 24 hr capsule 240 mg.     No current facility-administered medications for this visit.    Facility-Administered Medications Ordered in Other Visits  Medication Dose Route Frequency Provider Last Rate Last Dose  . heparin lock flush 100 unit/mL  500 Units Intracatheter Once PRN Nicholas Lose, MD      . pertuzumab (PERJETA) 420 mg in sodium chloride 0.9 % 250 mL chemo infusion  420 mg Intravenous Once Nicholas Lose, MD      . sodium chloride flush (NS) 0.9 % injection 10 mL  10 mL Intracatheter PRN Nicholas Lose, MD      . trastuzumab (HERCEPTIN) 441 mg in sodium chloride 0.9 % 250 mL chemo infusion  6 mg/kg (Treatment Plan Recorded) Intravenous Once Nicholas Lose, MD         PHYSICAL EXAMINATION: ECOG PERFORMANCE STATUS: 1 - Symptomatic but completely ambulatory  Vitals:   07/05/16 0856  BP: 121/70  Pulse: 100  Resp: 18  Temp: 99 F (37.2 C)   Filed Weights   07/05/16 0856  Weight: 157 lb 14.4 oz (71.6 kg)    GENERAL:alert, no distress and comfortable SKIN: skin color, texture, turgor are normal, no rashes or significant lesions EYES: normal, Conjunctiva are pink and non-injected, sclera clear OROPHARYNX:no exudate, no erythema and lips, buccal mucosa, and tongue normal  NECK: supple, thyroid normal size, non-tender, without nodularity LYMPH:  no palpable lymphadenopathy in the cervical, axillary or inguinal LUNGS: clear to auscultation and percussion with normal breathing effort HEART: regular rate & rhythm and no murmurs and no lower extremity edema ABDOMEN:abdomen soft, non-tender and normal bowel sounds MUSCULOSKELETAL:no cyanosis of digits and no clubbing  NEURO: alert & oriented x 3 with fluent speech, no focal motor/sensory  deficits EXTREMITIES: No lower extremity edema  LABORATORY DATA:  I have reviewed the data as listed   Chemistry      Component Value Date/Time   NA 143 07/05/2016 0838   K 3.7 07/05/2016 0838   CL 104 03/20/2016 0927   CO2 25 07/05/2016 0838   BUN 12.2 07/05/2016 0838   CREATININE 1.0 07/05/2016 0838      Component Value Date/Time   CALCIUM 9.0 07/05/2016 0838   ALKPHOS 166 (H) 07/05/2016 0838   AST 16 07/05/2016 0838   ALT 10 07/05/2016 0838   BILITOT 0.59 07/05/2016 0838       Lab Results  Component Value Date   WBC 2.1 (L) 07/05/2016   HGB 9.0 (L) 07/05/2016   HCT 26.8 (L) 07/05/2016   MCV 106.3 (H) 07/05/2016   PLT 57 (L) 07/05/2016   NEUTROABS 0.8 (L) 07/05/2016    ASSESSMENT & PLAN:  Malignant neoplasm of upper-outer quadrant of right female breast ([place]) Right breast biopsy 1:00, grade 2-3 IDC, ER 5%, PR 0%, Ki-67 90%, HER-2 positive ratio 7.28, right breast biopsy 1:00 1.5 cm medial  to the dominant mass fibrocystic changes; ultrasound and mammogram 02/23/2016: 2.5 cm mass at 1:00 position and a small 50m irregular mass 1.5 cm medial to the larger mass, T2 N0 stage II a clinical stage  Breast MRI 03/04/2016: 2.9 cm irregular enhancing mass located within the right breast at 1:00 position, several T2 bright lesions in the right lobe of the liver incompletely visualized could be hepatic cysts.  Recommendationbased on multidisciplinary tumor board: 1. Neoadjuvant chemotherapy with TCH Perjeta 6 cycles followed by Herceptin maintenance for 1 year 2. Followed by breast conserving surgery if possible with sentinel lymph node study 3. Followed by adjuvant radiation therapy if patient had lumpectomy --------------------------------------------------------------------------------------------------------------------------------------------------------------- Current Treatment: cycle 6day 1TCHP (Will discontinue Taxotere with cycle 5) Chemo Toxicities: 1. Severe thrush and mucositis:Prev treated with 200 mg daily of the Diflucan and Magic mouthwash. D/Cdexamethasone.  2.severe fatigue : Being monitored closely 3. Nausea due to chemotherapy  We may consider lowering the dosage of next chemotherapy depending on her blood counts on her next cycle.  4. Dehydration:  5. Chemotherapy induced anemia: Monitoring.  6.Diarrhea due to Perjeta: Persistent in spite of Lomotil and Imodium. We will see if it gets better since Taxotere is being discontinued. 8. Hand-foot syndrome: related to Taxotere. We discontinued Taxotere with cycle 5.  9. Neutropenia and thrombocytopenia with cycle 6 of chemotherapy: I discontinued carboplatin with this cycle. She will complete Herceptin and Perjeta  Breast MRI has been set up for 07/07/2016 follow-up on 07/12/2016 to discuss tumor board recommendation patient has an appointment to see surgery as well subsequently she will be continued on Herceptin  encouragement and maintenance RTC after breast MRI to discuss the result  I spent 25 minutes talking to the patient of which more than half was spent in counseling and coordination of care.  No orders of the defined types were placed in this encounter.  The patient has a good understanding of the overall plan. she agrees with it. she will call with any problems that may develop before the next visit here.   GRulon Eisenmenger MD 07/05/16

## 2016-07-05 NOTE — Patient Instructions (Signed)
[facility] Discharge Instructions for Patients Receiving Chemotherapy  Today you received the following chemotherapy agents Herceptin/Perjeta To help prevent nausea and vomiting after your treatment, we encourage you to take your nausea medication as prescribed.   If you develop nausea and vomiting that is not controlled by your nausea medication, call the clinic.   BELOW ARE SYMPTOMS THAT SHOULD BE REPORTED IMMEDIATELY:  *FEVER GREATER THAN 100.5 F  *CHILLS WITH OR WITHOUT FEVER  NAUSEA AND VOMITING THAT IS NOT CONTROLLED WITH YOUR NAUSEA MEDICATION  *UNUSUAL SHORTNESS OF BREATH  *UNUSUAL BRUISING OR BLEEDING  TENDERNESS IN MOUTH AND THROAT WITH OR WITHOUT PRESENCE OF ULCERS  *URINARY PROBLEMS  *BOWEL PROBLEMS  UNUSUAL RASH Items with * indicate a potential emergency and should be followed up as soon as possible.  Feel free to call the clinic you have any questions or concerns. The clinic phone number is [phone].  Please show the CHEMO ALERT CARD at check-in to the Emergency Department and triage nurse.  

## 2016-07-07 ENCOUNTER — Ambulatory Visit (HOSPITAL_COMMUNITY)
Admission: RE | Admit: 2016-07-07 | Discharge: 2016-07-07 | Disposition: A | Discharge status: Home / Self Care | Payer: Medicare FFS | Source: Ambulatory Visit | Attending: Hematology and Oncology | Admitting: Hematology and Oncology

## 2016-07-07 ENCOUNTER — Ambulatory Visit (HOSPITAL_COMMUNITY)
Admission: RE | Admit: 2016-07-07 | Discharge: 2016-07-07 | Disposition: A | Discharge status: Home / Self Care | Payer: Medicare FFS | Source: Ambulatory Visit | Attending: Internal Medicine | Admitting: Internal Medicine

## 2016-07-07 ENCOUNTER — Ambulatory Visit (HOSPITAL_BASED_OUTPATIENT_CLINIC_OR_DEPARTMENT_OTHER)
Admission: RE | Admit: 2016-07-07 | Discharge: 2016-07-07 | Disposition: A | Discharge status: Home / Self Care | Payer: Medicare FFS | Source: Ambulatory Visit

## 2016-07-07 VITALS — BP 132/82 | HR 102 | Wt 160.0 lb

## 2016-07-07 DIAGNOSIS — N6321 Unspecified lump in the left breast, upper outer quadrant: Secondary | ICD-10-CM | POA: Diagnosis not present

## 2016-07-07 DIAGNOSIS — Z08 Encounter for follow-up examination after completed treatment for malignant neoplasm: Secondary | ICD-10-CM

## 2016-07-07 DIAGNOSIS — C50411 Malignant neoplasm of upper-outer quadrant of right female breast: Secondary | ICD-10-CM | POA: Diagnosis not present

## 2016-07-07 DIAGNOSIS — Z170 Estrogen receptor positive status [ER+]: Secondary | ICD-10-CM | POA: Diagnosis not present

## 2016-07-07 DIAGNOSIS — Z09 Encounter for follow-up examination after completed treatment for conditions other than malignant neoplasm: Secondary | ICD-10-CM | POA: Insufficient documentation

## 2016-07-07 MED ORDER — GADOBENATE DIMEGLUMINE 529 MG/ML IV SOLN
15.0000 mL | Freq: Once | INTRAVENOUS | Status: AC | PRN
  Administered 2016-07-07: 15 mL via INTRAVENOUS

## 2016-07-07 NOTE — Progress Notes (Signed)
  Echocardiogram 2D Echocardiogram has been performed.  Brenda Harding 07/07/2016, 4:21 PM

## 2016-07-09 NOTE — Progress Notes (Signed)
Oncology: Dr. Lindi Harding  61 yo with history of HTN and breast cancer presents for cardio-oncology evaluation.  She has been generally in good health.  She has HTN controlled by verapamil CR and spironolactone.  No chest pain.  Mild dyspnea walking up a flight of steps.  HR is mildly elevated since starting chemotherapy but is regular.   Breast cancer was diagnosed by biopsy in 10/17.  ER+/PR-/HER2+.  Plan for [facility] Perjeta chemo x 6 cycles, then Herceptin alone to complete 1 year of treatment.  PMH:  1. HTN 2. Hyperlipidemia: Diet-controlled.  3. Anxiety 4. Breast cancer:  Diagnosed 10/17, ER+/PR-/HER2+.  Plan for [facility] Perjeta chemo x 6 cycles, then Herceptin alone to complete 1 year of treatment. - Echo (11/17): EF 60-65%, GLS -18.5%.  - Echo (3/18): EF 55-60%, GLS -20.8%, normal RV size and systolic function.   SH: Lives in [place], never smoked, rare ETOH.   FH: Sister with CABG at age 93.   ROS: All systems reviewed and negative except as per HPI.   Current Outpatient Prescriptions  Medication Sig Dispense Refill  . albuterol (PROVENTIL HFA;VENTOLIN HFA) 108 (90 Base) MCG/ACT inhaler Inhale 2 puffs into the lungs every 6 (six) hours as needed for wheezing or shortness of breath.    . ALPRAZolam (XANAX) 0.5 MG tablet Take 1 tablet (0.5 mg total) by mouth 2 (two) times daily as needed. 60 tablet 2  . betamethasone dipropionate (DIPROLENE) 0.05 % cream Apply topically 3 (three) times a week.    . Cholecalciferol (VITAMIN D3 PO) Take 2,500 mg by mouth 2 (two) times a week.     . diphenoxylate-atropine (LOMOTIL) 2.5-0.025 MG tablet Take 1 tablet by mouth 4 (four) times daily as needed for diarrhea or loose stools. 30 tablet 0  . GARLIC PO Take by mouth daily.    Marland Kitchen guaiFENesin (MUCINEX) 600 MG 12 hr tablet Take 600 mg by mouth 2 (two) times daily as needed.    . hydrOXYzine (ATARAX/VISTARIL) 25 MG tablet Take 1 tablet (25 mg total) by mouth 3 (three) times daily as needed. 60 tablet 3  .  ibuprofen (ADVIL,MOTRIN) 800 MG tablet 800 mg every 8 (eight) hours as needed for mild pain.     Marland Kitchen lansoprazole (PREVACID) 30 MG capsule Take 30 mg by mouth daily at 12 noon.    Marland Kitchen levocetirizine (XYZAL) 5 MG tablet Take 5 mg by mouth every evening.    Marland Kitchen LORazepam (ATIVAN) 0.5 MG tablet Take 1 tablet (0.5 mg total) by mouth at bedtime. 30 tablet 0  . magic mouthwash w/lidocaine SOLN Take 5 mLs by mouth 3 (three) times daily as needed for mouth pain. (Patient not taking: Reported on 06/12/2016) 150 mL 1  . montelukast (SINGULAIR) 10 MG tablet Take 10 mg by mouth at bedtime.     . ondansetron (ZOFRAN) 8 MG tablet Take 1 tablet (8 mg total) by mouth 2 (two) times daily as needed for refractory nausea / vomiting. Start on day 3 after chemo. (Patient not taking: Reported on 06/12/2016) 30 tablet 1  . Polyethyl Glycol-Propyl Glycol (SYSTANE OP) Apply to eye.    Marland Kitchen PREMARIN vaginal cream Place 1 Applicatorful vaginally 2 (two) times a week.     . prochlorperazine (COMPAZINE) 10 MG tablet Take 1 tablet (10 mg total) by mouth every 6 (six) hours as needed (Nausea or vomiting). (Patient not taking: Reported on 06/12/2016) 30 tablet 1  . spironolactone (ALDACTONE) 50 MG tablet Take 50 mg by mouth 2 (two) times  daily.     . Triamcinolone Acetonide (NASACORT ALLERGY 24HR NA) Place 1 spray into the nose daily.    . verapamil (VERELAN PM) 240 MG 24 hr capsule 240 mg.     No current facility-administered medications for this encounter.    BP 132/82 (BP Location: Left Arm, Patient Position: Sitting, Cuff Size: Normal)   Pulse (!) 102   Wt 160 lb (72.6 kg)   SpO2 99%   BMI 26.63 kg/m  General: NAD Neck: No JVD, no thyromegaly or thyroid nodule.  Lungs: Clear to auscultation bilaterally with normal respiratory effort. CV: Nondisplaced PMI.  Heart regular S1/S2, no S3/S4, no murmur.  No peripheral edema.  No carotid bruit.  Normal pedal pulses.  Abdomen: Soft, nontender, no hepatosplenomegaly, no distention.   Skin: Intact without lesions or rashes.  Neurologic: Alert and oriented x 3.  Psych: Normal affect. Extremities: No clubbing or cyanosis.  HEENT: Normal.   Assessment/Plan:  1. HTN: BP controlled on current regimen.   2. Breast cancer: Patient is undergoing a chemotherapy regimen including Herceptin.  Herceptin will continue 1 year.  I reviewed today's echo: EF remains stable with normal strain pattern.  - Repeat echo in 3 months with office visit.   Brenda Harding 07/09/2016

## 2016-07-10 ENCOUNTER — Other Ambulatory Visit: Payer: Self-pay | Admitting: *Deleted

## 2016-07-10 DIAGNOSIS — R928 Other abnormal and inconclusive findings on diagnostic imaging of breast: Secondary | ICD-10-CM

## 2016-07-11 ENCOUNTER — Encounter: Payer: Self-pay | Admitting: *Deleted

## 2016-07-11 ENCOUNTER — Telehealth: Payer: Self-pay

## 2016-07-11 DIAGNOSIS — C50411 Malignant neoplasm of upper-outer quadrant of right female breast: Secondary | ICD-10-CM

## 2016-07-11 MED ORDER — DIPHENOXYLATE-ATROPINE 2.5-0.025 MG PO TABS: 1.0000 | ORAL_TABLET | Freq: Four times a day (QID) | ORAL | 0 refills | Status: DC | PRN

## 2016-07-11 NOTE — Telephone Encounter (Signed)
Incoming fax refill request for lomotil. Phoned in

## 2016-07-12 ENCOUNTER — Ambulatory Visit (HOSPITAL_BASED_OUTPATIENT_CLINIC_OR_DEPARTMENT_OTHER): Payer: Medicare FFS | Admitting: Hematology and Oncology

## 2016-07-12 ENCOUNTER — Ambulatory Visit
Admission: RE | Admit: 2016-07-12 | Discharge: 2016-07-12 | Disposition: A | Discharge status: Home / Self Care | Payer: Medicare FFS | Source: Ambulatory Visit | Attending: Hematology and Oncology | Admitting: Hematology and Oncology

## 2016-07-12 ENCOUNTER — Encounter: Payer: Self-pay | Admitting: Hematology and Oncology

## 2016-07-12 ENCOUNTER — Other Ambulatory Visit: Payer: Self-pay | Admitting: Hematology and Oncology

## 2016-07-12 DIAGNOSIS — C50411 Malignant neoplasm of upper-outer quadrant of right female breast: Secondary | ICD-10-CM | POA: Diagnosis not present

## 2016-07-12 DIAGNOSIS — N649 Disorder of breast, unspecified: Secondary | ICD-10-CM | POA: Diagnosis not present

## 2016-07-12 DIAGNOSIS — R928 Other abnormal and inconclusive findings on diagnostic imaging of breast: Secondary | ICD-10-CM

## 2016-07-12 DIAGNOSIS — R599 Enlarged lymph nodes, unspecified: Secondary | ICD-10-CM

## 2016-07-12 DIAGNOSIS — Z170 Estrogen receptor positive status [ER+]: Secondary | ICD-10-CM

## 2016-07-12 DIAGNOSIS — K769 Liver disease, unspecified: Secondary | ICD-10-CM | POA: Diagnosis not present

## 2016-07-12 NOTE — Progress Notes (Signed)
Patient Care Team: Tobe Sos, MD as PCP - General (Internal Medicine) Viona Gilmore Evette Cristal, MD as Consulting Physician (Obstetrics and Gynecology) [name], MD as Consulting Physician (General Surgery) Nicholas Lose, MD as Consulting Physician (Hematology and Oncology) Kyung Rudd, MD as Consulting Physician (Radiation Oncology)  DIAGNOSIS:  Encounter Diagnosis  Name Primary?  . Malignant neoplasm of upper-outer quadrant of right breast in female, estrogen receptor positive ([place])     SUMMARY OF ONCOLOGIC HISTORY:   Malignant neoplasm of upper-outer quadrant of right female breast ([place])   02/24/2016 Initial Diagnosis    Right breast biopsy 1:00, grade 2-3 IDC, ER 5%, PR 0%, Ki-67 90%, HER-2 positive ratio 7.28, right breast biopsy 1:00 1.5 cm medial to the dominant mass fibrocystic changes; ultrasound and mammogram 02/23/2016: 2.5 cm mass at 1:00 position and a small 39m irregular mass 1.5 cm medial to the larger mass, T2 N0 stage II a clinical stage      03/04/2016 Breast MRI    2.9 cm irregular enhancing mass located within the right breast at 1:00 position, several T2 bright lesions in the right lobe of the liver incompletely visualized could be hepatic cysts      03/22/2016 - 07/05/2016 Neo-Adjuvant Chemotherapy    Neo-Adj TCHP 5 cycles followed by Herceptin and Perjeta      07/07/2016 Breast MRI    Right breast cancer: No measurable enhancement, residual 1-2 mm foci; left breast partially cystic lesion at 2:30 position 2.1 cm which is new 1 indeterminate left axillary lymph node       CHIEF COMPLIANT: Follow-up after recent breast MRI  INTERVAL HISTORY: Brenda SORCIis a 6 mm with above-mentioned history of right breast cancer who underwent neoadjuvant chemotherapy and is here today to discuss a post neoadjuvant breast MRI. She was noted on the MRI to have left breast abnormality which was biopsied. We do not have the results yet.  REVIEW OF SYSTEMS:   Constitutional:  Denies fevers, chills or abnormal weight loss Eyes: Denies blurriness of vision Ears, nose, mouth, throat, and face: Denies mucositis or sore throat Respiratory: Denies cough, dyspnea or wheezes Cardiovascular: Denies palpitation, chest discomfort Gastrointestinal:  Denies nausea, heartburn or change in bowel habits Skin: Denies abnormal skin rashes Lymphatics: Denies new lymphadenopathy or easy bruising Neurological:Denies numbness, tingling or new weaknesses Behavioral/Psych: Mood is stable, no new changes  Extremities: No lower extremity edema All other systems were reviewed with the patient and are negative.  I have reviewed the past medical history, past surgical history, social history and family history with the patient and they are unchanged from previous note.  ALLERGIES:  is allergic to inapsine [droperidol] and propoxyphene.  MEDICATIONS:  Current Outpatient Prescriptions  Medication Sig Dispense Refill  . albuterol (PROVENTIL HFA;VENTOLIN HFA) 108 (90 Base) MCG/ACT inhaler Inhale 2 puffs into the lungs every 6 (six) hours as needed for wheezing or shortness of breath.    . ALPRAZolam (XANAX) 0.5 MG tablet Take 1 tablet (0.5 mg total) by mouth 2 (two) times daily as needed. 60 tablet 2  . betamethasone dipropionate (DIPROLENE) 0.05 % cream Apply topically 3 (three) times a week.    . Cholecalciferol (VITAMIN D3 PO) Take 2,500 mg by mouth 2 (two) times a week.     . diphenoxylate-atropine (LOMOTIL) 2.5-0.025 MG tablet Take 1 tablet by mouth 4 (four) times daily as needed for diarrhea or loose stools. 30 tablet 0  . GARLIC PO Take by mouth daily.    .Marland Kitchen  guaiFENesin (MUCINEX) 600 MG 12 hr tablet Take 600 mg by mouth 2 (two) times daily as needed.    . hydrOXYzine (ATARAX/VISTARIL) 25 MG tablet Take 1 tablet (25 mg total) by mouth 3 (three) times daily as needed. 60 tablet 3  . ibuprofen (ADVIL,MOTRIN) 800 MG tablet 800 mg every 8 (eight) hours as needed for mild pain.     Marland Kitchen  lansoprazole (PREVACID) 30 MG capsule Take 30 mg by mouth daily at 12 noon.    Marland Kitchen levocetirizine (XYZAL) 5 MG tablet Take 5 mg by mouth every evening.    Marland Kitchen LORazepam (ATIVAN) 0.5 MG tablet Take 1 tablet (0.5 mg total) by mouth at bedtime. 30 tablet 0  . magic mouthwash w/lidocaine SOLN Take 5 mLs by mouth 3 (three) times daily as needed for mouth pain. (Patient not taking: Reported on 06/12/2016) 150 mL 1  . montelukast (SINGULAIR) 10 MG tablet Take 10 mg by mouth at bedtime.     . ondansetron (ZOFRAN) 8 MG tablet Take 1 tablet (8 mg total) by mouth 2 (two) times daily as needed for refractory nausea / vomiting. Start on day 3 after chemo. (Patient not taking: Reported on 06/12/2016) 30 tablet 1  . Polyethyl Glycol-Propyl Glycol (SYSTANE OP) Apply to eye.    Marland Kitchen PREMARIN vaginal cream Place 1 Applicatorful vaginally 2 (two) times a week.     . prochlorperazine (COMPAZINE) 10 MG tablet Take 1 tablet (10 mg total) by mouth every 6 (six) hours as needed (Nausea or vomiting). (Patient not taking: Reported on 06/12/2016) 30 tablet 1  . spironolactone (ALDACTONE) 50 MG tablet Take 50 mg by mouth 2 (two) times daily.     . Triamcinolone Acetonide (NASACORT ALLERGY 24HR NA) Place 1 spray into the nose daily.    . verapamil (VERELAN PM) 240 MG 24 hr capsule 240 mg.     No current facility-administered medications for this visit.     PHYSICAL EXAMINATION: ECOG PERFORMANCE STATUS: 1 - Symptomatic but completely ambulatory  Vitals:   07/12/16 1410  BP: (!) 142/86  Pulse: (!) 108  Resp: 18  Temp: 98 F (36.7 C)   Filed Weights   07/12/16 1410  Weight: 157 lb 12.8 oz (71.6 kg)    GENERAL:alert, no distress and comfortable SKIN: skin color, texture, turgor are normal, no rashes or significant lesions EYES: normal, Conjunctiva are pink and non-injected, sclera clear OROPHARYNX:no exudate, no erythema and lips, buccal mucosa, and tongue normal  NECK: supple, thyroid normal size, non-tender, without  nodularity LYMPH:  no palpable lymphadenopathy in the cervical, axillary or inguinal LUNGS: clear to auscultation and percussion with normal breathing effort HEART: regular rate & rhythm and no murmurs and no lower extremity edema ABDOMEN:abdomen soft, non-tender and normal bowel sounds MUSCULOSKELETAL:no cyanosis of digits and no clubbing  NEURO: alert & oriented x 3 with fluent speech, no focal motor/sensory deficits EXTREMITIES: No lower extremity edema  LABORATORY DATA:  I have reviewed the data as listed   Chemistry      Component Value Date/Time   NA 143 07/05/2016 0838   K 3.7 07/05/2016 0838   CL 104 03/20/2016 0927   CO2 25 07/05/2016 0838   BUN 12.2 07/05/2016 0838   CREATININE 1.0 07/05/2016 0838      Component Value Date/Time   CALCIUM 9.0 07/05/2016 0838   ALKPHOS 166 (H) 07/05/2016 0838   AST 16 07/05/2016 0838   ALT 10 07/05/2016 0838   BILITOT 0.59 07/05/2016 4718  Lab Results  Component Value Date   WBC 2.1 (L) 07/05/2016   HGB 9.0 (L) 07/05/2016   HCT 26.8 (L) 07/05/2016   MCV 106.3 (H) 07/05/2016   PLT 57 (L) 07/05/2016   NEUTROABS 0.8 (L) 07/05/2016    ASSESSMENT & PLAN:  Malignant neoplasm of upper-outer quadrant of right female breast ([place]) Right breast biopsy 1:00, grade 2-3 IDC, ER 5%, PR 0%, Ki-67 90%, HER-2 positive ratio 7.28, right breast biopsy 1:00 1.5 cm medial to the dominant mass fibrocystic changes; ultrasound and mammogram 02/23/2016: 2.5 cm mass at 1:00 position and a small 14m irregular mass 1.5 cm medial to the larger mass, T2 N0 stage II a clinical stage  Breast MRI 03/04/2016: 2.9 cm irregular enhancing mass located within the right breast at 1:00 position, several T2 bright lesions in the right lobe of the liver incompletely visualized could be hepatic cysts.  Recommendationbased on multidisciplinary tumor board: 1. Neoadjuvant chemotherapy with TCH Perjeta 6 cycles followed by Herceptin and Perjeta maintenance for 1  year 03/22/2016- 07/05/2017 2. Followed by breast conserving surgery if possible with sentinel lymph node study 3. Followed by adjuvant radiation therapy if patient had lumpectomy ---------------------------------------------------------------------------------------------------------------------------------------------------------Breast MRI 07/11/2006 : No measurable enhancement, residual 1-2 mm foci; left breast partially cystic lesion at 2:30 position 2.1 cm which is new 1 indeterminate left axillary lymph node  Left breast abnormality: Biopsy done today Return to clinic after surgery to discuss the pathology report.   I spent 25 minutes talking to the patient of which more than half was spent in counseling and coordination of care.  No orders of the defined types were placed in this encounter.  The patient has a good understanding of the overall plan. she agrees with it. she will call with any problems that may develop before the next visit here.   GRulon Eisenmenger MD 07/12/16

## 2016-07-12 NOTE — Assessment & Plan Note (Signed)
Right breast biopsy 1:00, grade 2-3 IDC, ER 5%, PR 0%, Ki-67 90%, HER-2 positive ratio 7.28, right breast biopsy 1:00 1.5 cm medial to the dominant mass fibrocystic changes; ultrasound and mammogram 02/23/2016: 2.5 cm mass at 1:00 position and a small 55m irregular mass 1.5 cm medial to the larger mass, T2 N0 stage II a clinical stage  Breast MRI 03/04/2016: 2.9 cm irregular enhancing mass located within the right breast at 1:00 position, several T2 bright lesions in the right lobe of the liver incompletely visualized could be hepatic cysts.  Recommendationbased on multidisciplinary tumor board: 1. Neoadjuvant chemotherapy with TCH Perjeta 6 cycles followed by Herceptin and Perjeta maintenance for 1 year 03/22/2016- 07/05/2017 2. Followed by breast conserving surgery if possible with sentinel lymph node study 3. Followed by adjuvant radiation therapy if patient had lumpectomy ---------------------------------------------------------------------------------------------------------------------------------------------------------Breast MRI 07/11/2006 : No measurable enhancement, residual 1-2 mm foci; left breast partially cystic lesion at 2:30 position 2.1 cm which is new 1 indeterminate left axillary lymph node  Return to clinic after surgery to discuss the pathology report.

## 2016-07-18 ENCOUNTER — Other Ambulatory Visit: Payer: Self-pay | Admitting: General Surgery

## 2016-07-18 DIAGNOSIS — C50211 Malignant neoplasm of upper-inner quadrant of right female breast: Secondary | ICD-10-CM

## 2016-07-24 ENCOUNTER — Other Ambulatory Visit: Payer: Self-pay | Admitting: General Surgery

## 2016-07-24 ENCOUNTER — Telehealth: Payer: Self-pay | Admitting: *Deleted

## 2016-07-24 DIAGNOSIS — C50211 Malignant neoplasm of upper-inner quadrant of right female breast: Secondary | ICD-10-CM

## 2016-07-24 NOTE — Telephone Encounter (Signed)
Pt called with questions regarding xrt after surgery. Informed pt that we will schedule her to see Dr. Lisbeth Renshaw 2 wks after sx and his office will schedule SIM and radiation tx. Received verbal understanding.

## 2016-07-25 ENCOUNTER — Other Ambulatory Visit: Payer: Self-pay | Admitting: Emergency Medicine

## 2016-07-25 ENCOUNTER — Encounter: Payer: Self-pay | Admitting: Radiation Oncology

## 2016-07-25 ENCOUNTER — Other Ambulatory Visit: Payer: Self-pay | Admitting: Hematology and Oncology

## 2016-07-25 DIAGNOSIS — Z170 Estrogen receptor positive status [ER+]: Secondary | ICD-10-CM

## 2016-07-25 DIAGNOSIS — C50411 Malignant neoplasm of upper-outer quadrant of right female breast: Secondary | ICD-10-CM

## 2016-07-26 ENCOUNTER — Other Ambulatory Visit: Payer: Self-pay | Admitting: General Surgery

## 2016-07-26 ENCOUNTER — Ambulatory Visit (HOSPITAL_BASED_OUTPATIENT_CLINIC_OR_DEPARTMENT_OTHER): Payer: Medicare FFS | Admitting: Nurse Practitioner

## 2016-07-26 ENCOUNTER — Ambulatory Visit (HOSPITAL_BASED_OUTPATIENT_CLINIC_OR_DEPARTMENT_OTHER): Payer: Medicare FFS

## 2016-07-26 ENCOUNTER — Encounter (HOSPITAL_COMMUNITY)
Admission: RE | Admit: 2016-07-26 | Discharge: 2016-07-26 | Disposition: A | Discharge status: Home / Self Care | Payer: Medicare FFS | Source: Ambulatory Visit | Attending: General Surgery | Admitting: General Surgery

## 2016-07-26 ENCOUNTER — Encounter (HOSPITAL_COMMUNITY): Payer: Self-pay | Admitting: *Deleted

## 2016-07-26 ENCOUNTER — Encounter: Payer: Self-pay | Admitting: Nurse Practitioner

## 2016-07-26 ENCOUNTER — Ambulatory Visit (HOSPITAL_COMMUNITY)
Admission: RE | Admit: 2016-07-26 | Discharge: 2016-07-26 | Disposition: A | Discharge status: Home / Self Care | Payer: Medicare FFS | Source: Ambulatory Visit | Attending: General Surgery | Admitting: General Surgery

## 2016-07-26 ENCOUNTER — Other Ambulatory Visit (HOSPITAL_BASED_OUTPATIENT_CLINIC_OR_DEPARTMENT_OTHER): Payer: Medicare FFS

## 2016-07-26 DIAGNOSIS — C50411 Malignant neoplasm of upper-outer quadrant of right female breast: Secondary | ICD-10-CM

## 2016-07-26 DIAGNOSIS — R062 Wheezing: Secondary | ICD-10-CM | POA: Diagnosis not present

## 2016-07-26 DIAGNOSIS — Z5112 Encounter for antineoplastic immunotherapy: Secondary | ICD-10-CM

## 2016-07-26 DIAGNOSIS — Z01818 Encounter for other preprocedural examination: Secondary | ICD-10-CM | POA: Insufficient documentation

## 2016-07-26 DIAGNOSIS — R05 Cough: Secondary | ICD-10-CM

## 2016-07-26 DIAGNOSIS — J40 Bronchitis, not specified as acute or chronic: Secondary | ICD-10-CM | POA: Insufficient documentation

## 2016-07-26 DIAGNOSIS — Z170 Estrogen receptor positive status [ER+]: Secondary | ICD-10-CM

## 2016-07-26 DIAGNOSIS — R0981 Nasal congestion: Secondary | ICD-10-CM | POA: Diagnosis not present

## 2016-07-26 LAB — CBC WITH DIFFERENTIAL/PLATELET
BASO%: 0.6 % (ref 0.0–2.0)
Basophils Absolute: 0 10*3/uL (ref 0.0–0.1)
EOS%: 0.9 % (ref 0.0–7.0)
Eosinophils Absolute: 0 10*3/uL (ref 0.0–0.5)
HCT: 31.6 % — ABNORMAL LOW (ref 34.8–46.6)
HGB: 10.8 g/dL — ABNORMAL LOW (ref 11.6–15.9)
LYMPH%: 37.2 % (ref 14.0–49.7)
MCH: 36.7 pg — ABNORMAL HIGH (ref 25.1–34.0)
MCHC: 34.2 g/dL (ref 31.5–36.0)
MCV: 107.4 fL — ABNORMAL HIGH (ref 79.5–101.0)
MONO#: 0.6 10*3/uL (ref 0.1–0.9)
MONO%: 12.6 % (ref 0.0–14.0)
NEUT#: 2.3 10*3/uL (ref 1.5–6.5)
NEUT%: 48.7 % (ref 38.4–76.8)
Platelets: 347 10*3/uL (ref 145–400)
RBC: 2.94 10*6/uL — ABNORMAL LOW (ref 3.70–5.45)
RDW: 15.5 % — ABNORMAL HIGH (ref 11.2–14.5)
WBC: 4.7 10*3/uL (ref 3.9–10.3)
lymph#: 1.8 10*3/uL (ref 0.9–3.3)

## 2016-07-26 LAB — COMPREHENSIVE METABOLIC PANEL
ALT: 12 U/L (ref 0–55)
AST: 15 U/L (ref 5–34)
Albumin: 4.2 g/dL (ref 3.5–5.0)
Alkaline Phosphatase: 167 U/L — ABNORMAL HIGH (ref 40–150)
Anion Gap: 11 mEq/L (ref 3–11)
BUN: 19.3 mg/dL (ref 7.0–26.0)
CO2: 25 mEq/L (ref 22–29)
Calcium: 9.9 mg/dL (ref 8.4–10.4)
Chloride: 104 mEq/L (ref 98–109)
Creatinine: 1.2 mg/dL — ABNORMAL HIGH (ref 0.6–1.1)
EGFR: 47 mL/min/{1.73_m2} — ABNORMAL LOW (ref >90)
Glucose: 76 mg/dl (ref 70–140)
Potassium: 3.8 mEq/L (ref 3.5–5.1)
Sodium: 140 mEq/L (ref 136–145)
Total Bilirubin: 0.45 mg/dL (ref 0.20–1.20)
Total Protein: 7.3 g/dL (ref 6.4–8.3)

## 2016-07-26 MED ORDER — AMOXICILLIN-POT CLAVULANATE 875-125 MG PO TABS: 1.0000 | ORAL_TABLET | Freq: Two times a day (BID) | ORAL | 0 refills | Status: DC

## 2016-07-26 MED ORDER — ACETAMINOPHEN 325 MG PO TABS
ORAL_TABLET | ORAL | Status: AC
  Filled 2016-07-26: qty 2

## 2016-07-26 MED ORDER — DIPHENHYDRAMINE HCL 25 MG PO CAPS
ORAL_CAPSULE | ORAL | Status: AC
  Filled 2016-07-26: qty 2

## 2016-07-26 MED ORDER — DIPHENHYDRAMINE HCL 25 MG PO CAPS
50.0000 mg | ORAL_CAPSULE | Freq: Once | ORAL | Status: AC
  Administered 2016-07-26: 50 mg via ORAL

## 2016-07-26 MED ORDER — SODIUM CHLORIDE 0.9 % IV SOLN
420.0000 mg | Freq: Once | INTRAVENOUS | Status: AC
  Administered 2016-07-26: 420 mg via INTRAVENOUS
  Filled 2016-07-26: qty 14

## 2016-07-26 MED ORDER — TRASTUZUMAB CHEMO 150 MG IV SOLR
6.0000 mg/kg | Freq: Once | INTRAVENOUS | Status: AC
  Administered 2016-07-26: 441 mg via INTRAVENOUS
  Filled 2016-07-26: qty 21

## 2016-07-26 MED ORDER — HEPARIN SOD (PORK) LOCK FLUSH 100 UNIT/ML IV SOLN
500.0000 [IU] | Freq: Once | INTRAVENOUS | Status: AC | PRN
  Administered 2016-07-26: 500 [IU]
  Filled 2016-07-26: qty 5

## 2016-07-26 MED ORDER — SODIUM CHLORIDE 0.9 % IV SOLN
Freq: Once | INTRAVENOUS | Status: AC
  Administered 2016-07-26: 10:00:00 via INTRAVENOUS

## 2016-07-26 MED ORDER — ACETAMINOPHEN 325 MG PO TABS
650.0000 mg | ORAL_TABLET | Freq: Once | ORAL | Status: AC
  Administered 2016-07-26: 650 mg via ORAL

## 2016-07-26 MED ORDER — SODIUM CHLORIDE 0.9% FLUSH
10.0000 mL | INTRAVENOUS | Status: DC | PRN
  Administered 2016-07-26: 10 mL
  Filled 2016-07-26: qty 10

## 2016-07-26 NOTE — Assessment & Plan Note (Addendum)
Patient presented to the [facility] today.  Receive her next infusion of Perjeta/Herceptin.  She states that she developed some URI symptoms last week; which consisted of nasal congestion, sinus drainage, and a congested, productive cough.  She saw her primary care provider; which then prescribed a Z-Pak for the patient.  Patient states the Z-Pak helped somewhat; but now the symptoms have returned since she has completed the Z-Pak.  She continues with some productive cough of yellow secretions.  She denies any recent fevers or chills within the last few days.  Patient also states she already has an albuterol inhaler at home to use as needed.  On exam.  Patient does have some moderate nasal congestion and some wheezes throughout all lung fields.  There is no acute respiratory distress.  Patient is afebrile today.  Advice patient would prescribed Augmentin antibiotics for treatment of sinusitis/bronchitis symptoms.  Patient was encouraged to call/return or go directed to the emergency department for any worsening symptoms whatsoever.

## 2016-07-26 NOTE — Progress Notes (Signed)
NOTIFIED ANGELA KABBE WITH ANESTHESIA OF PATIENT HAVING COUGH/ COLD, LIGHT GREEN SPUTUM, FEVER.  PATIENT SAW PCP AND WAS PLACED ON ZPAK AND NOW ANTIBIOTIC.  PER ANGELA WAS OKAY TO GET CXR TODAY.  ANGELA ALSO ASKED THAT PATIENT NOTIFY DR. BYERLY.  SPOKE WITH ARMEN , NURSE AT DR. BYERLY'S OFFICE AND MADE HER AWARE OF PATIENT HAVING COUGH+ LIGHT GREEN SPUTUM.

## 2016-07-26 NOTE — Assessment & Plan Note (Signed)
Patient presented to the [facility] today to receive cycle 7 of her Perjeta/Herceptin infusion.  She states that she typically develops diarrhea after each infusion.  She states that she alternates both the Lomotil and the Imodium as directed.  See further notes for details of today's visit.  Patient is scheduled for her breast surgery on 08/08/2016.  As of now-there is no Dr. Lindi Adie follow-up visit scheduled.  Will review this with Dr. Lindi Adie; and make sure the patient does have the appropriate follow-up scheduled.

## 2016-07-26 NOTE — Progress Notes (Signed)
Patient states she has a fever of 101.8 last week along with a cough, wheezing, and productive sputum. Patient states she seen her PCP and was prescribed a Z pack. However, patient states she finished her antibiotic Monday and the cough and sputum has returned. Patient states she is scheduled for surgery in 2 weeks and does not want to have a delay. Selena Lesser, NP notified.

## 2016-07-26 NOTE — Progress Notes (Signed)
SYMPTOM MANAGEMENT CLINIC    Chief Complaint: Sinusitis/bronchitis  HPI:  Brenda Harding 61 y.o. female diagnosed with breast cancer.  Currently undergoing Herceptin/Perjeta infusions.  Scheduled for her breast surgery within the next 2 weeks.     Malignant neoplasm of upper-outer quadrant of right female breast ([place])   02/24/2016 Initial Diagnosis    Right breast biopsy 1:00, grade 2-3 IDC, ER 5%, PR 0%, Ki-67 90%, HER-2 positive ratio 7.28, right breast biopsy 1:00 1.5 cm medial to the dominant mass fibrocystic changes; ultrasound and mammogram 02/23/2016: 2.5 cm mass at 1:00 position and a small 39m irregular mass 1.5 cm medial to the larger mass, T2 N0 stage II a clinical stage      03/04/2016 Breast MRI    2.9 cm irregular enhancing mass located within the right breast at 1:00 position, several T2 bright lesions in the right lobe of the liver incompletely visualized could be hepatic cysts      03/22/2016 - 07/05/2016 Neo-Adjuvant Chemotherapy    Neo-Adj TCHP 5 cycles followed by Herceptin and Perjeta      07/07/2016 Breast MRI    Right breast cancer: No measurable enhancement, residual 1-2 mm foci; left breast partially cystic lesion at 2:30 position 2.1 cm which is new 1 indeterminate left axillary lymph node       Review of Systems  Constitutional: Positive for malaise/fatigue.  HENT: Positive for congestion.   Respiratory: Positive for cough, sputum production and wheezing.   Gastrointestinal: Positive for diarrhea.  All other systems reviewed and are negative.   Past Medical History:  Diagnosis Date  . Anxiety   . Arthritis   . Headache   . History of wheezing   . Hypertension   . Malignant neoplasm of upper-outer quadrant of right female breast ([facility] 02/25/2016    Past Surgical History:  Procedure Laterality Date  . BACK SURGERY     scoliosis spinal surgery 1970  . benign tumor removal     1978  . CARPAL TUNNEL RELEASE    . PORTACATH PLACEMENT Left  03/21/2016   Procedure: INSERTION PORT-A-CATH;  Surgeon: FStark Klein MD;  [facility]  Service: General;  Laterality: Left;  . TRIGGER FINGER RELEASE      has Malignant neoplasm of upper-outer quadrant of right female breast ([facility]; Malignant neoplasm of upper-outer quadrant of right breast in female, estrogen receptor positive ([facility]; and Bronchitis on her problem list.    is allergic to inapsine [droperidol]; latex; levaquin [levofloxacin]; and propoxyphene.  Allergies as of 07/26/2016      Reactions   Inapsine [droperidol]    PATIENT PREFERENCE Pt does not want to be given this, sister had allergy to this   Latex Itching   Levaquin [levofloxacin] Nausea And Vomiting   Nausea, vomiting, diarrhea   Propoxyphene Other (See Comments)   Passed out, gets dizzy      Medication List       Accurate as of 07/26/16 11:30 AM. Always use your most recent med list.          albuterol 108 (90 Base) MCG/ACT inhaler Commonly known as:  PROVENTIL HFA;VENTOLIN HFA Inhale 2 puffs into the lungs 2 (two) times daily as needed for wheezing or shortness of breath.   ALPRAZolam 0.5 MG tablet Commonly known as:  XANAX Take 1 tablet (0.5 mg total) by mouth 2 (two) times daily as needed.   amoxicillin-clavulanate 875-125 MG tablet Commonly known as:  AUGMENTIN Take 1 tablet by mouth 2 (two)  times daily.   betamethasone dipropionate 0.05 % cream Commonly known as:  DIPROLENE Apply 1 application topically every Monday, Wednesday, and Friday. Applied to vagina   diphenoxylate-atropine 2.5-0.025 MG tablet Commonly known as:  LOMOTIL Take 1 tablet by mouth 4 (four) times daily as needed for diarrhea or loose stools.   GARLIC PO Take 6,270 mg by mouth daily.   guaiFENesin 600 MG 12 hr tablet Commonly known as:  MUCINEX Take 600 mg by mouth 2 (two) times daily as needed for cough or to loosen phlegm.   hydrOXYzine 25 MG tablet Commonly known as:  ATARAX/VISTARIL Take 1 tablet (25 mg  total) by mouth 3 (three) times daily as needed.   ibuprofen 800 MG tablet Commonly known as:  ADVIL,MOTRIN 800 mg every 8 (eight) hours as needed (for pain.).   lansoprazole 30 MG capsule Commonly known as:  PREVACID Take 30 mg by mouth daily.   levocetirizine 5 MG tablet Commonly known as:  XYZAL Take 5 mg by mouth every evening.   LORazepam 0.5 MG tablet Commonly known as:  ATIVAN Take 1 tablet (0.5 mg total) by mouth at bedtime.   magic mouthwash w/lidocaine Soln Take 5 mLs by mouth 3 (three) times daily as needed for mouth pain.   montelukast 10 MG tablet Commonly known as:  SINGULAIR Take 10 mg by mouth daily.   NASACORT ALLERGY 24HR NA Place 2 sprays into the nose daily.   ondansetron 8 MG tablet Commonly known as:  ZOFRAN Take 1 tablet (8 mg total) by mouth 2 (two) times daily as needed for refractory nausea / vomiting. Start on day 3 after chemo.   PREMARIN vaginal cream Generic drug:  conjugated estrogens Place 1 Applicatorful vaginally 2 (two) times a week. Tuesday & Saturday   prochlorperazine 10 MG tablet Commonly known as:  COMPAZINE Take 1 tablet (10 mg total) by mouth every 6 (six) hours as needed (Nausea or vomiting).   spironolactone 50 MG tablet Commonly known as:  ALDACTONE Take 50 mg by mouth 2 (two) times daily.   TUSSIONEX PENNKINETIC ER 10-8 MG/5ML Suer Generic drug:  chlorpheniramine-HYDROcodone Take 5 mLs by mouth at bedtime as needed for cough.   verapamil 240 MG CR tablet Commonly known as:  CALAN-SR Take 240 mg by mouth daily.   VITAMIN D3 PO Take 2,500 Units by mouth 2 (two) times a week.        PHYSICAL EXAMINATION  Oncology Vitals 07/26/2016 07/12/2016  Height - 165 cm  Weight - 71.578 kg  Weight (lbs) - 157 lbs 13 oz  BMI (kg/m2) - 26.26 kg/m2  Temp 98.1 98  Pulse 80 108  Resp 18 18  SpO2 100 100  BSA (m2) - 1.81 m2   BP Readings from Last 2 Encounters:  07/26/16 129/65  07/12/16 (!) 142/86    Physical Exam    Constitutional: She is oriented to person, place, and time and well-developed, well-nourished, and in no distress.  HENT:  Head: Normocephalic and atraumatic.  Mouth/Throat: Oropharynx is clear and moist.  Nasal congestion.  Eyes: Conjunctivae and EOM are normal. Pupils are equal, round, and reactive to light. Right eye exhibits no discharge. Left eye exhibits no discharge. No scleral icterus.  Neck: Normal range of motion. Neck supple. No JVD present. No tracheal deviation present. No thyromegaly present.  Cardiovascular: Normal rate, regular rhythm, normal heart sounds and intact distal pulses.   Pulmonary/Chest: Effort normal. No respiratory distress. She has wheezes. She has no rales. She exhibits  no tenderness.  Abdominal: Soft. Bowel sounds are normal. She exhibits no distension and no mass. There is no tenderness. There is no rebound and no guarding.  Musculoskeletal: Normal range of motion. She exhibits no edema or tenderness.  Lymphadenopathy:    She has no cervical adenopathy.  Neurological: She is alert and oriented to person, place, and time. Gait normal.  Skin: Skin is warm and dry. No rash noted. No erythema. No pallor.  Psychiatric: Affect normal.  Nursing note and vitals reviewed.   LABORATORY DATA:. Appointment on 07/26/2016  Component Date Value Ref Range Status  . WBC 07/26/2016 4.7  3.9 - 10.3 10e3/uL Final  . NEUT# 07/26/2016 2.3  1.5 - 6.5 10e3/uL Final  . HGB 07/26/2016 10.8* 11.6 - 15.9 g/dL Final  . HCT 07/26/2016 31.6* 34.8 - 46.6 % Final  . Platelets 07/26/2016 347  145 - 400 10e3/uL Final  . MCV 07/26/2016 107.4* 79.5 - 101.0 fL Final  . MCH 07/26/2016 36.7* 25.1 - 34.0 pg Final  . MCHC 07/26/2016 34.2  31.5 - 36.0 g/dL Final  . RBC 07/26/2016 2.94* 3.70 - 5.45 10e6/uL Final  . RDW 07/26/2016 15.5* 11.2 - 14.5 % Final  . lymph# 07/26/2016 1.8  0.9 - 3.3 10e3/uL Final  . MONO# 07/26/2016 0.6  0.1 - 0.9 10e3/uL Final  . Eosinophils Absolute 07/26/2016 0.0   0.0 - 0.5 10e3/uL Final  . Basophils Absolute 07/26/2016 0.0  0.0 - 0.1 10e3/uL Final  . NEUT% 07/26/2016 48.7  38.4 - 76.8 % Final  . LYMPH% 07/26/2016 37.2  14.0 - 49.7 % Final  . MONO% 07/26/2016 12.6  0.0 - 14.0 % Final  . EOS% 07/26/2016 0.9  0.0 - 7.0 % Final  . BASO% 07/26/2016 0.6  0.0 - 2.0 % Final  . Sodium 07/26/2016 140  136 - 145 mEq/L Final  . Potassium 07/26/2016 3.8  3.5 - 5.1 mEq/L Final  . Chloride 07/26/2016 104  98 - 109 mEq/L Final  . CO2 07/26/2016 25  22 - 29 mEq/L Final  . Glucose 07/26/2016 76  70 - 140 mg/dl Final  . BUN 07/26/2016 19.3  7.0 - 26.0 mg/dL Final  . Creatinine 07/26/2016 1.2* 0.6 - 1.1 mg/dL Final  . Total Bilirubin 07/26/2016 0.45  0.20 - 1.20 mg/dL Final  . Alkaline Phosphatase 07/26/2016 167* 40 - 150 U/L Final  . AST 07/26/2016 15  5 - 34 U/L Final  . ALT 07/26/2016 12  0 - 55 U/L Final  . Total Protein 07/26/2016 7.3  6.4 - 8.3 g/dL Final  . Albumin 07/26/2016 4.2  3.5 - 5.0 g/dL Final  . Calcium 07/26/2016 9.9  8.4 - 10.4 mg/dL Final  . Anion Gap 07/26/2016 11  3 - 11 mEq/L Final  . EGFR 07/26/2016 47* >90 ml/min/1.73 m2 Final    RADIOGRAPHIC STUDIES: No results found.  ASSESSMENT/PLAN:    Malignant neoplasm of upper-outer quadrant of right female breast [facility]) Patient presented to the [facility] today to receive cycle 7 of her Perjeta/Herceptin infusion.  She states that she typically develops diarrhea after each infusion.  She states that she alternates both the Lomotil and the Imodium as directed.  See further notes for details of today's visit.  Patient is scheduled for her breast surgery on 08/08/2016.  As of now-there is no Dr. Lindi Adie follow-up visit scheduled.  Will review this with Dr. Lindi Adie; and make sure the patient does have the appropriate follow-up scheduled.  Bronchitis Patient presented to the  cancer Center today.  Receive her next infusion of Perjeta/Herceptin.  She states that she developed some URI symptoms last  week; which consisted of nasal congestion, sinus drainage, and a congested, productive cough.  She saw her primary care provider; which then prescribed a Z-Pak for the patient.  Patient states the Z-Pak helped somewhat; but now the symptoms have returned since she has completed the Z-Pak.  She continues with some productive cough of yellow secretions.  She denies any recent fevers or chills within the last few days.  Patient also states she already has an albuterol inhaler at home to use as needed.  On exam.  Patient does have some moderate nasal congestion and some wheezes throughout all lung fields.  There is no acute respiratory distress.  Patient is afebrile today.  Advice patient would prescribed Augmentin antibiotics for treatment of sinusitis/bronchitis symptoms.  Patient was encouraged to call/return or go directed to the emergency department for any worsening symptoms whatsoever.   Patient stated understanding of all instructions; and was in agreement with this plan of care. The patient knows to call the clinic with any problems, questions or concerns.   Total time spent with patient was 25 minutes;  with greater than 75 percent of that time spent in face to face counseling regarding patient's symptoms,  and coordination of care and follow up.  Disclaimer:This dictation was prepared with Dragon/digital dictation along with [employer]. Any transcriptional errors that result from this process are unintentional.  Drue Second, NP 07/26/2016

## 2016-07-26 NOTE — Patient Instructions (Signed)
[facility] Discharge Instructions for Patients Receiving Chemotherapy  Today you received the following chemotherapy agents Herceptin/Perjeta To help prevent nausea and vomiting after your treatment, we encourage you to take your nausea medication as prescribed.   If you develop nausea and vomiting that is not controlled by your nausea medication, call the clinic.   BELOW ARE SYMPTOMS THAT SHOULD BE REPORTED IMMEDIATELY:  *FEVER GREATER THAN 100.5 F  *CHILLS WITH OR WITHOUT FEVER  NAUSEA AND VOMITING THAT IS NOT CONTROLLED WITH YOUR NAUSEA MEDICATION  *UNUSUAL SHORTNESS OF BREATH  *UNUSUAL BRUISING OR BLEEDING  TENDERNESS IN MOUTH AND THROAT WITH OR WITHOUT PRESENCE OF ULCERS  *URINARY PROBLEMS  *BOWEL PROBLEMS  UNUSUAL RASH Items with * indicate a potential emergency and should be followed up as soon as possible.  Feel free to call the clinic you have any questions or concerns. The clinic phone number is [phone].  Please show the CHEMO ALERT CARD at check-in to the Emergency Department and triage nurse.  

## 2016-07-31 ENCOUNTER — Telehealth: Payer: Self-pay | Admitting: *Deleted

## 2016-07-31 NOTE — Telephone Encounter (Signed)
  Oncology Nurse Navigator Documentation  Navigator Location: [facility] (07/31/16 1300)   )Navigator Encounter Type: Telephone (07/31/16 1300) Telephone: Lahoma Crocker Call;Appt Confirmation/Clarification (07/31/16 1300)  Schedule and confirmed appt with Dr. Lindi Adie for post op on 4/23 at 2:30.  Scheduling msg sent to request herceptin and lab to be scheduled on 4/18 and every three wks. Discussed with pt that she will receive a call regarding appt dates and time. Pt relate she has tooth that needs to be pulled and wishes that to happen after her sx. Informed pt that during her post op appt she can request a release at that time. Denies further needs or questions at this time.                                                Time Spent with Patient: 30 (07/31/16 1300)

## 2016-08-02 ENCOUNTER — Telehealth: Payer: Self-pay | Admitting: *Deleted

## 2016-08-02 NOTE — Telephone Encounter (Signed)
TCT patient to f/u on pt's bronchitis and response to antibiotic. No answer but was able to leave vm message for pt to call back if she was not feeling better.

## 2016-08-08 ENCOUNTER — Ambulatory Visit
Admission: RE | Admit: 2016-08-08 | Discharge: 2016-08-08 | Disposition: A | Discharge status: Home / Self Care | Payer: Medicare FFS | Source: Ambulatory Visit | Attending: General Surgery | Admitting: General Surgery

## 2016-08-08 ENCOUNTER — Other Ambulatory Visit: Payer: Self-pay | Admitting: General Surgery

## 2016-08-08 DIAGNOSIS — C50211 Malignant neoplasm of upper-inner quadrant of right female breast: Secondary | ICD-10-CM

## 2016-08-08 MED ORDER — CEFAZOLIN SODIUM-DEXTROSE 2-4 GM/100ML-% IV SOLN
2.0000 g | INTRAVENOUS | Status: AC
  Administered 2016-08-09: 2 g via INTRAVENOUS
  Filled 2016-08-08: qty 100

## 2016-08-09 ENCOUNTER — Ambulatory Visit
Admission: RE | Admit: 2016-08-09 | Discharge: 2016-08-09 | Disposition: A | Discharge status: Home / Self Care | Payer: Medicare FFS | Source: Ambulatory Visit | Attending: General Surgery | Admitting: General Surgery

## 2016-08-09 ENCOUNTER — Encounter (HOSPITAL_COMMUNITY): Payer: Self-pay | Admitting: *Deleted

## 2016-08-09 ENCOUNTER — Ambulatory Visit (HOSPITAL_COMMUNITY)
Admission: RE | Admit: 2016-08-09 | Discharge: 2016-08-09 | Disposition: A | Discharge status: Home / Self Care | Payer: Medicare FFS | Source: Ambulatory Visit | Attending: General Surgery | Admitting: General Surgery

## 2016-08-09 ENCOUNTER — Encounter (HOSPITAL_COMMUNITY)
Admission: RE | Disposition: A | Discharge status: Home / Self Care | Payer: Self-pay | Source: Ambulatory Visit | Attending: General Surgery

## 2016-08-09 ENCOUNTER — Ambulatory Visit (HOSPITAL_COMMUNITY): Payer: Medicare FFS | Admitting: Anesthesiology

## 2016-08-09 ENCOUNTER — Ambulatory Visit (HOSPITAL_COMMUNITY): Payer: Medicare FFS | Admitting: Emergency Medicine

## 2016-08-09 DIAGNOSIS — C50211 Malignant neoplasm of upper-inner quadrant of right female breast: Secondary | ICD-10-CM

## 2016-08-09 DIAGNOSIS — Z79899 Other long term (current) drug therapy: Secondary | ICD-10-CM | POA: Diagnosis not present

## 2016-08-09 DIAGNOSIS — Z170 Estrogen receptor positive status [ER+]: Secondary | ICD-10-CM | POA: Diagnosis not present

## 2016-08-09 DIAGNOSIS — G629 Polyneuropathy, unspecified: Secondary | ICD-10-CM | POA: Diagnosis not present

## 2016-08-09 DIAGNOSIS — Z853 Personal history of malignant neoplasm of breast: Secondary | ICD-10-CM | POA: Insufficient documentation

## 2016-08-09 DIAGNOSIS — K219 Gastro-esophageal reflux disease without esophagitis: Secondary | ICD-10-CM | POA: Insufficient documentation

## 2016-08-09 DIAGNOSIS — F419 Anxiety disorder, unspecified: Secondary | ICD-10-CM | POA: Diagnosis not present

## 2016-08-09 DIAGNOSIS — J45909 Unspecified asthma, uncomplicated: Secondary | ICD-10-CM | POA: Diagnosis not present

## 2016-08-09 DIAGNOSIS — C50412 Malignant neoplasm of upper-outer quadrant of left female breast: Secondary | ICD-10-CM | POA: Diagnosis not present

## 2016-08-09 DIAGNOSIS — Z9221 Personal history of antineoplastic chemotherapy: Secondary | ICD-10-CM | POA: Insufficient documentation

## 2016-08-09 DIAGNOSIS — Z01818 Encounter for other preprocedural examination: Secondary | ICD-10-CM

## 2016-08-09 DIAGNOSIS — Z791 Long term (current) use of non-steroidal anti-inflammatories (NSAID): Secondary | ICD-10-CM | POA: Diagnosis not present

## 2016-08-09 DIAGNOSIS — C50911 Malignant neoplasm of unspecified site of right female breast: Secondary | ICD-10-CM | POA: Diagnosis present

## 2016-08-09 DIAGNOSIS — Z79890 Hormone replacement therapy (postmenopausal): Secondary | ICD-10-CM | POA: Diagnosis not present

## 2016-08-09 DIAGNOSIS — I10 Essential (primary) hypertension: Secondary | ICD-10-CM | POA: Insufficient documentation

## 2016-08-09 HISTORY — DX: Bronchitis, not specified as acute or chronic: J40

## 2016-08-09 HISTORY — DX: Other specified cough: R05.8

## 2016-08-09 HISTORY — PX: BREAST LUMPECTOMY WITH RADIOACTIVE SEED AND SENTINEL LYMPH NODE BIOPSY: SHX6550

## 2016-08-09 HISTORY — DX: Gastro-esophageal reflux disease without esophagitis: K21.9

## 2016-08-09 HISTORY — DX: Cough: R05

## 2016-08-09 SURGERY — BREAST LUMPECTOMY WITH RADIOACTIVE SEED AND SENTINEL LYMPH NODE BIOPSY
Anesthesia: General | Site: Breast | Laterality: Right

## 2016-08-09 MED ORDER — METHYLENE BLUE 0.5 % INJ SOLN
INTRAVENOUS | Status: AC
  Filled 2016-08-09: qty 10

## 2016-08-09 MED ORDER — HYDROMORPHONE HCL 1 MG/ML IJ SOLN
0.2500 mg | INTRAMUSCULAR | Status: DC | PRN
  Administered 2016-08-09: 0.5 mg via INTRAVENOUS

## 2016-08-09 MED ORDER — BUPIVACAINE-EPINEPHRINE (PF) 0.25% -1:200000 IJ SOLN
INTRAMUSCULAR | Status: DC | PRN
  Administered 2016-08-09: 20 mL

## 2016-08-09 MED ORDER — PROMETHAZINE HCL 25 MG/ML IJ SOLN: 6.2500 mg | INTRAMUSCULAR | Status: DC | PRN

## 2016-08-09 MED ORDER — FENTANYL CITRATE (PF) 100 MCG/2ML IJ SOLN
50.0000 ug | Freq: Once | INTRAMUSCULAR | Status: AC
  Administered 2016-08-09: 50 ug via INTRAVENOUS

## 2016-08-09 MED ORDER — CHLORHEXIDINE GLUCONATE CLOTH 2 % EX PADS: 6.0000 | MEDICATED_PAD | Freq: Once | CUTANEOUS | Status: DC

## 2016-08-09 MED ORDER — FENTANYL CITRATE (PF) 250 MCG/5ML IJ SOLN
INTRAMUSCULAR | Status: DC | PRN
  Administered 2016-08-09 (×2): 50 ug via INTRAVENOUS
  Administered 2016-08-09: 100 ug via INTRAVENOUS
  Administered 2016-08-09: 50 ug via INTRAVENOUS

## 2016-08-09 MED ORDER — MIDAZOLAM HCL 5 MG/5ML IJ SOLN
INTRAMUSCULAR | Status: DC | PRN
  Administered 2016-08-09: 2 mg via INTRAVENOUS

## 2016-08-09 MED ORDER — PROPOFOL 10 MG/ML IV BOLUS
INTRAVENOUS | Status: DC | PRN
  Administered 2016-08-09: 150 mg via INTRAVENOUS

## 2016-08-09 MED ORDER — BUPIVACAINE HCL (PF) 0.25 % IJ SOLN
INTRAMUSCULAR | Status: AC
  Filled 2016-08-09: qty 30

## 2016-08-09 MED ORDER — MIDAZOLAM HCL 2 MG/2ML IJ SOLN: 0.5000 mg | Freq: Once | INTRAMUSCULAR | Status: DC | PRN

## 2016-08-09 MED ORDER — LIDOCAINE HCL (CARDIAC) 20 MG/ML IV SOLN
INTRAVENOUS | Status: DC | PRN
  Administered 2016-08-09: 20 mg via INTRATRACHEAL

## 2016-08-09 MED ORDER — PHENYLEPHRINE HCL 10 MG/ML IJ SOLN
INTRAMUSCULAR | Status: DC | PRN
  Administered 2016-08-09 (×2): 120 ug via INTRAVENOUS
  Administered 2016-08-09 (×2): 80 ug via INTRAVENOUS
  Administered 2016-08-09: 120 ug via INTRAVENOUS

## 2016-08-09 MED ORDER — LACTATED RINGERS IV SOLN
INTRAVENOUS | Status: DC | PRN
  Administered 2016-08-09 (×2): via INTRAVENOUS

## 2016-08-09 MED ORDER — LIDOCAINE-EPINEPHRINE 1 %-1:100000 IJ SOLN
INTRAMUSCULAR | Status: AC
  Filled 2016-08-09: qty 1

## 2016-08-09 MED ORDER — LIDOCAINE 2% (20 MG/ML) 5 ML SYRINGE
INTRAMUSCULAR | Status: AC
  Filled 2016-08-09: qty 5

## 2016-08-09 MED ORDER — MIDAZOLAM HCL 2 MG/2ML IJ SOLN
INTRAMUSCULAR | Status: AC
  Administered 2016-08-09: 1 mg via INTRAVENOUS
  Filled 2016-08-09: qty 2

## 2016-08-09 MED ORDER — SODIUM CHLORIDE 0.9 % IJ SOLN
INTRAVENOUS | Status: DC | PRN
  Administered 2016-08-09: 5 mL

## 2016-08-09 MED ORDER — 0.9 % SODIUM CHLORIDE (POUR BTL) OPTIME
TOPICAL | Status: DC | PRN
  Administered 2016-08-09: 1000 mL

## 2016-08-09 MED ORDER — PROPOFOL 10 MG/ML IV BOLUS
INTRAVENOUS | Status: AC
  Filled 2016-08-09: qty 20

## 2016-08-09 MED ORDER — MIDAZOLAM HCL 2 MG/2ML IJ SOLN
INTRAMUSCULAR | Status: AC
  Filled 2016-08-09: qty 2

## 2016-08-09 MED ORDER — DEXAMETHASONE SODIUM PHOSPHATE 10 MG/ML IJ SOLN
INTRAMUSCULAR | Status: AC
  Filled 2016-08-09: qty 1

## 2016-08-09 MED ORDER — OXYCODONE HCL 5 MG PO TABS: 5.0000 mg | ORAL_TABLET | Freq: Four times a day (QID) | ORAL | 0 refills | Status: DC | PRN

## 2016-08-09 MED ORDER — ONDANSETRON HCL 4 MG/2ML IJ SOLN
INTRAMUSCULAR | Status: DC | PRN
  Administered 2016-08-09: 4 mg via INTRAVENOUS

## 2016-08-09 MED ORDER — FENTANYL CITRATE (PF) 250 MCG/5ML IJ SOLN
INTRAMUSCULAR | Status: AC
  Filled 2016-08-09: qty 5

## 2016-08-09 MED ORDER — ONDANSETRON HCL 4 MG/2ML IJ SOLN
INTRAMUSCULAR | Status: AC
  Filled 2016-08-09: qty 2

## 2016-08-09 MED ORDER — BUPIVACAINE-EPINEPHRINE (PF) 0.5% -1:200000 IJ SOLN
INTRAMUSCULAR | Status: DC | PRN
  Administered 2016-08-09: 30 mL

## 2016-08-09 MED ORDER — MEPERIDINE HCL 25 MG/ML IJ SOLN: 6.2500 mg | INTRAMUSCULAR | Status: DC | PRN

## 2016-08-09 MED ORDER — HYDROMORPHONE HCL 1 MG/ML IJ SOLN
INTRAMUSCULAR | Status: AC
  Filled 2016-08-09: qty 0.5

## 2016-08-09 MED ORDER — MIDAZOLAM HCL 2 MG/2ML IJ SOLN
1.0000 mg | Freq: Once | INTRAMUSCULAR | Status: AC
  Administered 2016-08-09: 1 mg via INTRAVENOUS

## 2016-08-09 MED ORDER — LACTATED RINGERS IV SOLN
Freq: Once | INTRAVENOUS | Status: AC
  Administered 2016-08-09: 12:00:00 via INTRAVENOUS

## 2016-08-09 MED ORDER — FENTANYL CITRATE (PF) 100 MCG/2ML IJ SOLN
INTRAMUSCULAR | Status: AC
  Administered 2016-08-09: 50 ug via INTRAVENOUS
  Filled 2016-08-09: qty 2

## 2016-08-09 MED ORDER — TECHNETIUM TC 99M SULFUR COLLOID FILTERED
1.0000 | Freq: Once | INTRAVENOUS | Status: AC | PRN
  Administered 2016-08-09: 1 via INTRADERMAL

## 2016-08-09 SURGICAL SUPPLY — 56 items
APPLIER CLIP 9.375 MED OPEN (MISCELLANEOUS) ×2
BINDER BREAST LRG (GAUZE/BANDAGES/DRESSINGS) ×2 IMPLANT
BINDER BREAST XLRG (GAUZE/BANDAGES/DRESSINGS) IMPLANT
BLADE SURG 10 STRL SS (BLADE) ×2 IMPLANT
BNDG COHESIVE 4X5 TAN STRL (GAUZE/BANDAGES/DRESSINGS) ×2 IMPLANT
CANISTER SUCT 3000ML PPV (MISCELLANEOUS) ×2 IMPLANT
CHLORAPREP W/TINT 26ML (MISCELLANEOUS) ×2 IMPLANT
CLIP APPLIE 9.375 MED OPEN (MISCELLANEOUS) ×1 IMPLANT
CLIP TI LARGE 6 (CLIP) ×2 IMPLANT
CLIP TI MEDIUM 6 (CLIP) ×4 IMPLANT
CLIP TI WIDE RED SMALL 6 (CLIP) ×2 IMPLANT
CLSR STERI-STRIP ANTIMIC 1/2X4 (GAUZE/BANDAGES/DRESSINGS) ×2 IMPLANT
CONT SPEC 4OZ CLIKSEAL STRL BL (MISCELLANEOUS) IMPLANT
COVER MAYO STAND STRL (DRAPES) ×2 IMPLANT
COVER PROBE W GEL 5X96 (DRAPES) ×2 IMPLANT
COVER SURGICAL LIGHT HANDLE (MISCELLANEOUS) ×2 IMPLANT
DERMABOND ADHESIVE PROPEN (GAUZE/BANDAGES/DRESSINGS) ×1
DERMABOND ADVANCED (GAUZE/BANDAGES/DRESSINGS) ×1
DERMABOND ADVANCED .7 DNX12 (GAUZE/BANDAGES/DRESSINGS) ×1 IMPLANT
DERMABOND ADVANCED .7 DNX6 (GAUZE/BANDAGES/DRESSINGS) ×1 IMPLANT
DEVICE DUBIN SPECIMEN MAMMOGRA (MISCELLANEOUS) IMPLANT
DRAPE CHEST BREAST 15X10 FENES (DRAPES) ×2 IMPLANT
DRAPE UNIVERSAL PACK (DRAPES) ×2 IMPLANT
DRAPE UTILITY XL STRL (DRAPES) ×4 IMPLANT
ELECT CAUTERY BLADE 6.4 (BLADE) ×2 IMPLANT
ELECT REM PT RETURN 9FT ADLT (ELECTROSURGICAL) ×2
ELECTRODE REM PT RTRN 9FT ADLT (ELECTROSURGICAL) ×1 IMPLANT
GAUZE SPONGE 4X4 12PLY STRL LF (GAUZE/BANDAGES/DRESSINGS) ×2 IMPLANT
GLOVE BIO SURGEON STRL SZ 6 (GLOVE) ×2 IMPLANT
GLOVE BIOGEL PI IND STRL 6.5 (GLOVE) ×1 IMPLANT
GLOVE BIOGEL PI INDICATOR 6.5 (GLOVE) ×1
GOWN STRL REUS W/ TWL LRG LVL3 (GOWN DISPOSABLE) ×1 IMPLANT
GOWN STRL REUS W/TWL 2XL LVL3 (GOWN DISPOSABLE) ×2 IMPLANT
GOWN STRL REUS W/TWL LRG LVL3 (GOWN DISPOSABLE) ×1
KIT BASIN OR (CUSTOM PROCEDURE TRAY) ×2 IMPLANT
KIT MARKER MARGIN INK (KITS) ×2 IMPLANT
LIGHT WAVEGUIDE WIDE FLAT (MISCELLANEOUS) ×2 IMPLANT
NDL SAFETY ECLIPSE 18X1.5 (NEEDLE) IMPLANT
NEEDLE FILTER BLUNT 18X 1/2SAF (NEEDLE)
NEEDLE FILTER BLUNT 18X1 1/2 (NEEDLE) IMPLANT
NEEDLE HYPO 18GX1.5 SHARP (NEEDLE)
NEEDLE HYPO 25GX1X1/2 BEV (NEEDLE) ×2 IMPLANT
NS IRRIG 1000ML POUR BTL (IV SOLUTION) ×2 IMPLANT
PACK SURGICAL SETUP 50X90 (CUSTOM PROCEDURE TRAY) ×2 IMPLANT
PAD ABD 8X10 STRL (GAUZE/BANDAGES/DRESSINGS) ×2 IMPLANT
PENCIL BUTTON HOLSTER BLD 10FT (ELECTRODE) ×2 IMPLANT
SPONGE LAP 18X18 X RAY DECT (DISPOSABLE) ×2 IMPLANT
STOCKINETTE IMPERVIOUS 9X36 MD (GAUZE/BANDAGES/DRESSINGS) ×4 IMPLANT
SUT MNCRL AB 4-0 PS2 18 (SUTURE) ×2 IMPLANT
SUT VIC AB 3-0 SH 8-18 (SUTURE) ×2 IMPLANT
SYR BULB 3OZ (MISCELLANEOUS) ×2 IMPLANT
SYR CONTROL 10ML LL (SYRINGE) ×2 IMPLANT
TOWEL OR 17X24 6PK STRL BLUE (TOWEL DISPOSABLE) ×2 IMPLANT
TOWEL OR 17X26 10 PK STRL BLUE (TOWEL DISPOSABLE) ×2 IMPLANT
TUBE CONNECTING 12X1/4 (SUCTIONS) ×2 IMPLANT
YANKAUER SUCT BULB TIP NO VENT (SUCTIONS) ×2 IMPLANT

## 2016-08-09 NOTE — Anesthesia Postprocedure Evaluation (Signed)
Anesthesia Post Note  Patient: TONNIA BARDIN  Procedure(s) Performed: Procedure(s) (LRB): RIGHT BREAST LUMPECTOMY WITH RADIOACTIVE SEED AND SENTINEL LYMPH NODE BIOPSY (Right)  Patient location during evaluation: PACU Anesthesia Type: General and Regional Level of consciousness: awake and alert Pain management: pain level controlled Vital Signs Assessment: post-procedure vital signs reviewed and stable Respiratory status: spontaneous breathing, nonlabored ventilation, respiratory function stable and patient connected to nasal cannula oxygen Cardiovascular status: blood pressure returned to baseline and stable Postop Assessment: no signs of nausea or vomiting Anesthetic complications: no       Last Vitals:  Vitals:   08/09/16 1615 08/09/16 1620  BP:  113/66  Pulse: 83 83  Resp: 13 13  Temp:      Last Pain:  Vitals:   08/09/16 1623  TempSrc:   PainSc: Asleep                 Ronnica Dreese,W. EDMOND

## 2016-08-09 NOTE — Op Note (Addendum)
Right Breast Radioactive seed localized lumpectomy and sentinel lymph node mapping and biopsy  Indications: This patient presents with history of right breast cancer, s/p neoadjuvant chemotherapy.    Pre-operative Diagnosis: Right breast cancer, cT2N0, UOQ, ER 5%, PR -, Her 2 +  Post-operative Diagnosis: Same  Surgeon: [name]   Anesthesia: General endotracheal anesthesia  ASA Class: 3  Procedure Details  The patient was seen in the Holding Room. The risks, benefits, complications, treatment options, and expected outcomes were discussed with the patient. The possibilities of bleeding, infection, the need for additional procedures, failure to diagnose a condition, and creating a complication requiring transfusion or operation were discussed with the patient. The patient concurred with the proposed plan, giving informed consent.  The site of surgery properly noted/marked. The patient was taken to Operating Room # 2, identified, and the procedure verified as Right Breast seed localized Lumpectomy with sentinel lymph node biopsy. A Time Out was held and the above information confirmed.  The methylene blue was injected in the subareolar location  The right arm, breast, and chest were prepped and draped in standard fashion. The lumpectomy was performed by creating a circumareolar incision over the upper inner quadrant of the breast over the previously placed radioactive seed.  Dissection was carried down to around the point of maximum signal intensity. The cautery was used to perform the dissection.  Hemostasis was achieved with cautery. The edges of the cavity were marked with large clips, with one each medial, lateral, inferior and superior, and two clips posteriorly.   The specimen was inked with the margin marker paint kit.    Specimen radiography confirmed inclusion of the mammographic lesion, the clip, and the seed.  The background signal in the breast was zero.  The wound was irrigated and  closed with 3-0 vicryl in layers and 4-0 monocryl subcuticular suture.    Using a hand-held gamma probe, deep level 2 axillary sentinel nodes were identified transcutaneously.  An oblique incision was created below the axillary hairline.  Dissection was carried through the clavipectoral fascia.  Three level 2 axillary sentinel nodes were removed.  Counts per second were 950, 45, and 0 (palpable).    The background count was 0 cps.  The wound was irrigated.  Hemostasis was achieved with cautery.  The axillary incision was closed with a 3-0 vicryl deep dermal interrupted sutures and a 4-0 monocryl subcuticular closure.    Sterile dressings were applied. At the end of the operation, all sponge, instrument, and needle counts were correct.  Findings: grossly clear surgical margins and no adenopathy.  Posterior margin is pectoralis major.  Estimated Blood Loss:  min         Specimens: Right breast lumpectomy and three axillary sentinel lymph nodes.             Complications:  None; patient tolerated the procedure well.         Disposition: PACU - hemodynamically stable.         Condition: stable

## 2016-08-09 NOTE — Transfer of Care (Signed)
Immediate Anesthesia Transfer of Care Note  Patient: Brenda Harding  Procedure(s) Performed: Procedure(s): RIGHT BREAST LUMPECTOMY WITH RADIOACTIVE SEED AND SENTINEL LYMPH NODE BIOPSY (Right)  Patient Location: PACU  Anesthesia Type:GA combined with regional for post-op pain  Level of Consciousness: awake, alert  and oriented  Airway & Oxygen Therapy: Patient Spontanous Breathing and Patient connected to nasal cannula oxygen  Post-op Assessment: Report given to RN and Post -op Vital signs reviewed and stable  Post vital signs: Reviewed and stable  Last Vitals:  Vitals:   08/09/16 1303 08/09/16 1310  BP: 119/65 117/63  Pulse: 91 91  Resp: 12 19  Temp:      Last Pain:  Vitals:   08/09/16 1140  TempSrc: Oral  PainSc:       Patients Stated Pain Goal: 3 (13/88/71 9597)  Complications: No apparent anesthesia complications

## 2016-08-09 NOTE — Anesthesia Procedure Notes (Signed)
Anesthesia Regional Block: Pectoralis block   Pre-Anesthetic Checklist: ,, timeout performed, Correct Patient, Correct Site, Correct Laterality, Correct Procedure, Correct Position, site marked, Risks and benefits discussed,  Surgical consent,  Pre-op evaluation,  At surgeon's request and post-op pain management  Laterality: Right  Prep: chloraprep       Needles:   Needle Type: Echogenic Needle     Needle Length: 9cm  Needle Gauge: 21     Additional Needles:   Procedures: ultrasound guided,,,,,,,,  Narrative:  Start time: 08/09/2016 12:50 PM End time: 08/09/2016 12:57 PM Injection made incrementally with aspirations every 5 mL.  Performed by: Personally  Anesthesiologist: Glennon Mac, Saniyya Gau  Additional Notes: Pt identified in Holding room.  Monitors applied. Working IV access confirmed. Sterile prep, drape R pec and chest.  #21ga ECHOgenic needle sub-serratus between ribs 4,5.  30cc 0.5% Bupivacaine with 1:200k epi injected incrementally after negative test dose.  Patient asymptomatic, VSS, no heme aspirated, tolerated well.  Jenita Seashore, MD

## 2016-08-09 NOTE — H&P (Signed)
Brenda Harding 07/12/2016 11:08 AM Location: [facility] Patient #: [id number] DOB: 30-Jul-1955 Married / Language: [name] / Race: White Female   History of Present Illness [name] MD; 07/18/2016 9:34 PM) The patient is a 61 year old female who presents for a follow-up for Breast cancer. patient is a 61 year old female diagnosed with right breast cancer October 2017. The patient presented with a palpable breast mass despite normal mammogram several months before. She had 2.5 cm mass seen on diagnostic imaging at 1:00 on the right breast. Core needle biopsy was performed which showed grade 3 invasive ductal carcinoma. This was weakly ER positive at 5%. PR was negative and HER-2/neu was amplified at a ratio of 7.28. Ki-67 was 90%. There was no axillary adenopathy seen on mammography or ultrasound. The other breast was clear. The patient has a maternal aunt with breast cancer, a paternal first cousin with breast cancer diagnosed in her 58s, and a first cousin on the maternal side with colon cancer. Her mother has lived to be 73 so far. She is a nonsmoker and does not use alcohol or illicit drugs. She had menarche at age 31. She has undergone menopause at age 79. She used Premarin vaginal cream for a year but no other hormone replacement. She is G2 P2 with her first birth at 68. She used oral contraceptive pills for a total of around 40 years.   She has been undergoing neoadjuvant chemo wtih TCHP and herceptin/perjeta. New MRI showed near resolution of initial tumor (1-2 mm), but showed a new lesion on the left at 2:30 along wtih a left indeterminate lymph node in the axilla. This was biopsied today, but results are not yet available. Pt is understandably anxious since she has been undergoing chemo.   She has had a hard time with chemo. She got hand/foot syndrome with nail discoloration. Taxotere had to be discontinued. Carboplatin had to be discontinued with cycle 6 due to  continued neutropenia. She also had singificant diarrhea.   MRI 07/10/16 IMPRESSION: Known right breast upper inner quadrant malignancy demonstrates no measurable enhancement, with residual discontinuous 1-2 mm foci of enhancement within the otherwise nonenhancing mass.  New suspicious predominantly progressive rim enhancing mass in the left breast upper outer quadrant, anterior depth.  One indeterminate left axillary lymph node.   Allergies Patsey Berthold, CMA; 07/12/2016 11:10 AM) Inapsine *ANTIANXIETY AGENTS*  Propoxyphene Compound *ANALGESICS - OPIOID*   Medication History Patsey Berthold, CMA; 07/12/2016 11:20 AM) ALPRAZolam (0.5MG Tablet, Oral) Active. Betamethasone Dipropionate (0.05% Cream, External) Active. Vitamin D (Cholecalciferol) (1000UNIT Capsule, Oral) Active. Diphenoxylate-Atropine (2.5-0.025MG Tablet, Oral) Active. Garlic (Oral) Specific strength unknown - Active. HydrOXYzine HCl (25MG Tablet, Oral) Active. Ibuprofen (800MG Tablet, Oral) Active. Lansoprazole (30MG Capsule DR, Oral) Active. Premarin (0.625MG/GM Cream, Vaginal) Active. Levocetirizine Dihydrochloride (5MG Tablet, Oral) Active. Montelukast Sodium (10MG Tablet, Oral) Active. Systane (0.4-0.3% Solution, Ophthalmic) Active. Spironolactone (50MG Tablet, Oral) Active. Verapamil HCl ER (240MG Capsule ER 24HR, Oral) Active. Nasacort (55MCG/ACT Aerosol Soln, Nasal) Active. Medications Reconciled    Review of Systems [name] MD; 07/18/2016 9:31 PM) All other systems negative  Vitals Patsey Berthold CMA; 07/12/2016 11:21 AM) 07/12/2016 11:20 AM Weight: 155.2 lb Height: 65in Height was reported by patient. Body Surface Area: 1.78 m Body Mass Index: 25.83 kg/m  Temp.: 32F  Pulse: 108 (Regular)  BP: 112/62 (Sitting, Left Arm, Standard)       Physical Exam [name] MD; 07/18/2016 9:34 PM) General Mental Status-Alert. General Appearance-Consistent with  stated age. Hydration-Well  hydrated. Voice-Normal.  Head and Neck Head-normocephalic, atraumatic with no lesions or palpable masses.  Eye Sclera/Conjunctiva - Bilateral-No scleral icterus.  Chest and Lung Exam Chest and lung exam reveals -quiet, even and easy respiratory effort with no use of accessory muscles. Inspection Chest Wall - Normal. Back - normal.  Breast Note: No palpable mass either breast. No LAD. No nipple retraction or discharge. No skin dimpling. breasts with some ptosis. reasonably symmetric bilaterally.   Cardiovascular Cardiovascular examination reveals -normal pedal pulses bilaterally. Note: regular rate and rhythm  Abdomen Inspection-Inspection Normal. Palpation/Percussion Palpation and Percussion of the abdomen reveal - Soft, Non Tender, No Rebound tenderness, No Rigidity (guarding) and No hepatosplenomegaly.  Peripheral Vascular Upper Extremity Inspection - Bilateral - Normal - No Clubbing, No Cyanosis, No Edema, Pulses Intact. Lower Extremity Palpation - Edema - Bilateral - No edema.  Neurologic Neurologic evaluation reveals -alert and oriented x 3 with no impairment of recent or remote memory. Mental Status-Normal.  Musculoskeletal Global Assessment -Note: no gross deformities.  Normal Exam - Left-Upper Extremity Strength Normal and Lower Extremity Strength Normal. Normal Exam - Right-Upper Extremity Strength Normal and Lower Extremity Strength Normal.  Lymphatic Head & Neck  General Head & Neck Lymphatics: Bilateral - Description - Normal. Axillary  General Axillary Region: Bilateral - Description - Normal. Tenderness - Non Tender.    Assessment & Plan [name] MD; 07/18/2016 9:37 PM) BREAST CANCER OF UPPER-INNER QUADRANT OF RIGHT FEMALE BREAST (C50.211) Impression: Will plan lumpectomy with sentinel lymph node mapping and biopsy on the right. If the left is also cancer, we will plan bilateral  lump/SLN.  I reviewed the surgery and the seed placement. I also discussed the pec block and sentinel lymph node injection.  I reviewed that this is outpatient. The surgical procedure was described to the patient. I discussed the incision type and location and that we would need radiology involved on with a wire or seed marker and/or sentinel node.  The risks and benefits of the procedure were described to the patient and she wishes to proceed.  We discussed the risks bleeding, infection, damage to other structures, need for further procedures/surgeries. We discussed the risk of seroma. The patient was advised if the area in the breast in cancer, we may need to go back to surgery for additional tissue to obtain negative margins or for a lymph node biopsy. The patient was advised that these are the most common complications, but that others can occur as well. They were advised against taking aspirin or other anti-inflammatory agents/blood thinners the week before surgery. Current Plans You are being scheduled for surgery- Our schedulers will call you.  You should hear from our office's scheduling department within 5 working days about the location, date, and time of surgery. We try to make accommodations for patient's preferences in scheduling surgery, but sometimes the OR schedule or the surgeon's schedule prevents [place] from making those accommodations.  If you have not heard from our office [phone]) in 5 working days, call the office and ask for your surgeon's nurse.  If you have other questions about your diagnosis, plan, or surgery, call the office and ask for your surgeon's nurse.  Pt Education - [facility]b breast cancer surgery: discussed with patient and provided information. ABNORMAL MAGNETIC RESONANCE IMAGING OF LEFT BREAST (R92.8) Impression: Biopsy pending. Will address surgically at the same time if malignant or if other finding like ADH/radial scar/papilloma/etc.    Signed by  [name], MD (07/18/2016 9:37 PM)

## 2016-08-09 NOTE — Interval H&P Note (Signed)
History and Physical Interval Note:  08/09/2016 12:56 PM  Brenda Harding  has presented today for surgery, with the diagnosis of RIGHT BREAST CANCER. The various methods of treatment have been discussed with the patient and family. After consideration of risks, benefits and other options for treatment, the patient has consented to  Procedure(s): RIGHT BREAST LUMPECTOMY WITH RADIOACTIVE SEED AND SENTINEL LYMPH NODE BIOPSY (Right) as a surgical intervention .  The patient's history has been reviewed, patient examined, no change in status, stable for surgery.  I have reviewed the patient's chart and labs.  Questions were answered to the patient's satisfaction.     Taler Kushner

## 2016-08-09 NOTE — Anesthesia Procedure Notes (Signed)
Procedure Name: LMA Insertion Date/Time: 08/09/2016 1:35 PM Performed by: Mariea Clonts Pre-anesthesia Checklist: Patient identified, Emergency Drugs available, Suction available and Patient being monitored Patient Re-evaluated:Patient Re-evaluated prior to inductionOxygen Delivery Method: Circle System Utilized Preoxygenation: Pre-oxygenation with 100% oxygen Intubation Type: IV induction Ventilation: Mask ventilation without difficulty LMA: LMA inserted LMA Size: 4.0 Number of attempts: 1 Airway Equipment and Method: Bite block Placement Confirmation: positive ETCO2 Tube secured with: Tape Dental Injury: Teeth and Oropharynx as per pre-operative assessment

## 2016-08-09 NOTE — Anesthesia Preprocedure Evaluation (Addendum)
Anesthesia Evaluation  Patient identified by MRN, date of birth, ID band Patient awake    Reviewed: Allergy & Precautions, NPO status , Patient's Chart, lab work & pertinent test results  History of Anesthesia Complications Negative for: history of anesthetic complications  Airway Mallampati: II  TM Distance: >3 FB Neck ROM: Full    Dental  (+) Dental Advisory Given   Pulmonary asthma ,    breath sounds clear to auscultation       Cardiovascular hypertension, Pt. on medications (-) angina Rhythm:Regular Rate:Normal  3/18 ECHO:  EF 55-60%, valves OK   Neuro/Psych Anxiety Peripheral neuropathy    GI/Hepatic Neg liver ROS, GERD  Medicated and Controlled,  Endo/Other  negative endocrine ROS  Renal/GU negative Renal ROS     Musculoskeletal   Abdominal   Peds  Hematology negative hematology ROS (+)   Anesthesia Other Findings Breast cancer  Reproductive/Obstetrics                            Anesthesia Physical Anesthesia Plan  ASA: III  Anesthesia Plan: General   Post-op Pain Management: GA combined w/ Regional for post-op pain   Induction: Intravenous  Airway Management Planned: LMA  Additional Equipment:   Intra-op Plan:   Post-operative Plan:   Informed Consent: I have reviewed the patients History and Physical, chart, labs and discussed the procedure including the risks, benefits and alternatives for the proposed anesthesia with the patient or authorized representative who has indicated his/her understanding and acceptance.   Dental advisory given  Plan Discussed with: CRNA and Surgeon  Anesthesia Plan Comments: (Plan routine monitors, GA- LMA OK, pectoralis block for post op analgesia)        Anesthesia Quick Evaluation

## 2016-08-09 NOTE — Discharge Instructions (Signed)
[facility] °Office Phone Number [phone] ° °BREAST BIOPSY/ PARTIAL MASTECTOMY: POST OP INSTRUCTIONS ° °Always review your discharge instruction sheet given to you by the facility where your surgery was performed. ° °IF YOU HAVE DISABILITY OR FAMILY LEAVE FORMS, YOU MUST BRING THEM TO THE OFFICE FOR PROCESSING.  DO NOT GIVE THEM TO YOUR DOCTOR. ° °1. A prescription for pain medication may be given to you upon discharge.  Take your pain medication as prescribed, if needed.  If narcotic pain medicine is not needed, then you may take acetaminophen (Tylenol) or ibuprofen (Advil) as needed. °2. Take your usually prescribed medications unless otherwise directed °3. If you need a refill on your pain medication, please contact your pharmacy.  They will contact our office to request authorization.  Prescriptions will not be filled after 5pm or on week-ends. °4. You should eat very light the first 24 hours after surgery, such as soup, crackers, pudding, etc.  Resume your normal diet the day after surgery. °5. Most patients will experience some swelling and bruising in the breast.  Ice packs and a good support bra will help.  Swelling and bruising can take several days to resolve.  °6. It is common to experience some constipation if taking pain medication after surgery.  Increasing fluid intake and taking a stool softener will usually help or prevent this problem from occurring.  A mild laxative (Milk of Magnesia or Miralax) should be taken according to package directions if there are no bowel movements after 48 hours. °7. Unless discharge instructions indicate otherwise, you may remove your bandages 48 hours after surgery, and you may shower at that time.  You may have steri-strips (small skin tapes) in place directly over the incision.  These strips should be left on the skin for 7-10 days.   Any sutures or staples will be removed at the office during your follow-up visit. °8. ACTIVITIES:  You may resume  regular daily activities (gradually increasing) beginning the next day.  Wearing a good support bra or sports bra (or the breast binder) minimizes pain and swelling.  You may have sexual intercourse when it is comfortable. °a. You may drive when you no longer are taking prescription pain medication, you can comfortably wear a seatbelt, and you can safely maneuver your car and apply brakes. °b. RETURN TO WORK:  __________1 week_______________ °9. You should see your doctor in the office for a follow-up appointment approximately two weeks after your surgery.  Your doctor’s nurse will typically make your follow-up appointment when she calls you with your pathology report.  Expect your pathology report 2-3 business days after your surgery.  You may call to check if you do not hear from us after three days. ° ° °WHEN TO CALL YOUR DOCTOR: °1. Fever over 101.0 °2. Nausea and/or vomiting. °3. Extreme swelling or bruising. °4. Continued bleeding from incision. °5. Increased pain, redness, or drainage from the incision. ° °The clinic staff is available to answer your questions during regular business hours.  Please don’t hesitate to call and ask to speak to one of the nurses for clinical concerns.  If you have a medical emergency, go to the nearest emergency room or call 911.  A surgeon from [facility] is always on call at the hospital. ° °For further questions, please visit [email or web address]  ° °

## 2016-08-10 ENCOUNTER — Encounter (HOSPITAL_COMMUNITY): Payer: Self-pay | Admitting: General Surgery

## 2016-08-11 NOTE — Progress Notes (Signed)
Please let patient know margins and lymph nodes are negative.  All invasive cancer was gone after chemo.

## 2016-08-15 ENCOUNTER — Other Ambulatory Visit: Payer: Self-pay | Admitting: Emergency Medicine

## 2016-08-15 DIAGNOSIS — C50411 Malignant neoplasm of upper-outer quadrant of right female breast: Secondary | ICD-10-CM

## 2016-08-15 DIAGNOSIS — Z170 Estrogen receptor positive status [ER+]: Secondary | ICD-10-CM

## 2016-08-16 ENCOUNTER — Other Ambulatory Visit (HOSPITAL_BASED_OUTPATIENT_CLINIC_OR_DEPARTMENT_OTHER): Payer: Medicare FFS

## 2016-08-16 ENCOUNTER — Encounter: Payer: Self-pay | Admitting: Pharmacist

## 2016-08-16 ENCOUNTER — Ambulatory Visit (HOSPITAL_BASED_OUTPATIENT_CLINIC_OR_DEPARTMENT_OTHER): Payer: Medicare FFS

## 2016-08-16 ENCOUNTER — Encounter: Payer: Self-pay | Admitting: Hematology and Oncology

## 2016-08-16 ENCOUNTER — Ambulatory Visit (HOSPITAL_BASED_OUTPATIENT_CLINIC_OR_DEPARTMENT_OTHER): Payer: Medicare FFS | Admitting: Hematology and Oncology

## 2016-08-16 DIAGNOSIS — Z170 Estrogen receptor positive status [ER+]: Secondary | ICD-10-CM

## 2016-08-16 DIAGNOSIS — C50411 Malignant neoplasm of upper-outer quadrant of right female breast: Secondary | ICD-10-CM

## 2016-08-16 DIAGNOSIS — K769 Liver disease, unspecified: Secondary | ICD-10-CM

## 2016-08-16 DIAGNOSIS — Z5112 Encounter for antineoplastic immunotherapy: Secondary | ICD-10-CM

## 2016-08-16 LAB — COMPREHENSIVE METABOLIC PANEL
ALT: 8 U/L (ref 0–55)
AST: 16 U/L (ref 5–34)
Albumin: 4.1 g/dL (ref 3.5–5.0)
Alkaline Phosphatase: 172 U/L — ABNORMAL HIGH (ref 40–150)
Anion Gap: 8 mEq/L (ref 3–11)
BUN: 26.1 mg/dL — ABNORMAL HIGH (ref 7.0–26.0)
CO2: 29 mEq/L (ref 22–29)
Calcium: 9.6 mg/dL (ref 8.4–10.4)
Chloride: 104 mEq/L (ref 98–109)
Creatinine: 1.3 mg/dL — ABNORMAL HIGH (ref 0.6–1.1)
EGFR: 45 mL/min/{1.73_m2} — ABNORMAL LOW (ref >90)
Glucose: 87 mg/dl (ref 70–140)
Potassium: 4 mEq/L (ref 3.5–5.1)
Sodium: 141 mEq/L (ref 136–145)
Total Bilirubin: 0.63 mg/dL (ref 0.20–1.20)
Total Protein: 7 g/dL (ref 6.4–8.3)

## 2016-08-16 LAB — CBC WITH DIFFERENTIAL/PLATELET
BASO%: 0.9 % (ref 0.0–2.0)
Basophils Absolute: 0 10*3/uL (ref 0.0–0.1)
EOS%: 2.5 % (ref 0.0–7.0)
Eosinophils Absolute: 0.1 10*3/uL (ref 0.0–0.5)
HCT: 34.9 % (ref 34.8–46.6)
HGB: 11.8 g/dL (ref 11.6–15.9)
LYMPH%: 27.8 % (ref 14.0–49.7)
MCH: 35.6 pg — ABNORMAL HIGH (ref 25.1–34.0)
MCHC: 33.8 g/dL (ref 31.5–36.0)
MCV: 105.2 fL — ABNORMAL HIGH (ref 79.5–101.0)
MONO#: 0.5 10*3/uL (ref 0.1–0.9)
MONO%: 9.4 % (ref 0.0–14.0)
NEUT#: 3.1 10*3/uL (ref 1.5–6.5)
NEUT%: 59.4 % (ref 38.4–76.8)
Platelets: 247 10*3/uL (ref 145–400)
RBC: 3.32 10*6/uL — ABNORMAL LOW (ref 3.70–5.45)
RDW: 13.4 % (ref 11.2–14.5)
WBC: 5.2 10*3/uL (ref 3.9–10.3)
lymph#: 1.4 10*3/uL (ref 0.9–3.3)

## 2016-08-16 MED ORDER — HEPARIN SOD (PORK) LOCK FLUSH 100 UNIT/ML IV SOLN
500.0000 [IU] | Freq: Once | INTRAVENOUS | Status: AC | PRN
  Administered 2016-08-16: 500 [IU]
  Filled 2016-08-16: qty 5

## 2016-08-16 MED ORDER — DIPHENHYDRAMINE HCL 25 MG PO CAPS
ORAL_CAPSULE | ORAL | Status: AC
  Filled 2016-08-16: qty 2

## 2016-08-16 MED ORDER — DIPHENHYDRAMINE HCL 25 MG PO CAPS
50.0000 mg | ORAL_CAPSULE | Freq: Once | ORAL | Status: AC
  Administered 2016-08-16: 50 mg via ORAL

## 2016-08-16 MED ORDER — SODIUM CHLORIDE 0.9 % IV SOLN
Freq: Once | INTRAVENOUS | Status: AC
  Administered 2016-08-16: 11:00:00 via INTRAVENOUS

## 2016-08-16 MED ORDER — ACETAMINOPHEN 325 MG PO TABS
650.0000 mg | ORAL_TABLET | Freq: Once | ORAL | Status: AC
  Administered 2016-08-16: 650 mg via ORAL

## 2016-08-16 MED ORDER — TRASTUZUMAB CHEMO 150 MG IV SOLR
6.0000 mg/kg | Freq: Once | INTRAVENOUS | Status: AC
  Administered 2016-08-16: 441 mg via INTRAVENOUS
  Filled 2016-08-16: qty 21

## 2016-08-16 MED ORDER — PERTUZUMAB CHEMO INJECTION 420 MG/14ML
420.0000 mg | Freq: Once | INTRAVENOUS | Status: AC
  Administered 2016-08-16: 420 mg via INTRAVENOUS
  Filled 2016-08-16: qty 14

## 2016-08-16 MED ORDER — SODIUM CHLORIDE 0.9% FLUSH
10.0000 mL | INTRAVENOUS | Status: DC | PRN
  Administered 2016-08-16: 10 mL
  Filled 2016-08-16: qty 10

## 2016-08-16 MED ORDER — ACETAMINOPHEN 325 MG PO TABS
ORAL_TABLET | ORAL | Status: AC
  Filled 2016-08-16: qty 2

## 2016-08-16 NOTE — Patient Instructions (Signed)
[facility] Discharge Instructions for Patients Receiving Chemotherapy  Today you received the following chemotherapy agents Herceptin/Perjeta To help prevent nausea and vomiting after your treatment, we encourage you to take your nausea medication as prescribed.   If you develop nausea and vomiting that is not controlled by your nausea medication, call the clinic.   BELOW ARE SYMPTOMS THAT SHOULD BE REPORTED IMMEDIATELY:  *FEVER GREATER THAN 100.5 F  *CHILLS WITH OR WITHOUT FEVER  NAUSEA AND VOMITING THAT IS NOT CONTROLLED WITH YOUR NAUSEA MEDICATION  *UNUSUAL SHORTNESS OF BREATH  *UNUSUAL BRUISING OR BLEEDING  TENDERNESS IN MOUTH AND THROAT WITH OR WITHOUT PRESENCE OF ULCERS  *URINARY PROBLEMS  *BOWEL PROBLEMS  UNUSUAL RASH Items with * indicate a potential emergency and should be followed up as soon as possible.  Feel free to call the clinic you have any questions or concerns. The clinic phone number is [phone].  Please show the CHEMO ALERT CARD at check-in to the Emergency Department and triage nurse.  

## 2016-08-16 NOTE — Progress Notes (Signed)
Consent documentation  Study code: [id number]  Met with patient and her husband following the patients provider visit on 08/16/16 at 1000. Provided an overview of the "Pharmacogenetic analysis of toxicities related to administration of taxanes in breast cancer patients" study.  Consent form was reviewed with the patient (reviewed the study purpose, patient's role, possible side effects, cost, risk, information protection) All of the patient's questions were answered and she agreed to participate in the study. A signed copy of the consent form was given to the patient. All eligibility criteria have been met and patient has been enrolled in the study.  Study sample was collected via buccal swab after consent was obtained. Patient was informed that the sample would be sent to the lab for processing after samples were collected from 25 patients and that it would take approximately 2 weeks for the lab to process that sample.   Met with the patient for approximately 15 minutes.  Darl Pikes, PharmD, [place] Clinical Pharmacist- Oncology Pharmacy Resident

## 2016-08-16 NOTE — Assessment & Plan Note (Signed)
Right breast biopsy 1:00, grade 2-3 IDC, ER 5%, PR 0%, Ki-67 90%, HER-2 positive ratio 7.28, right breast biopsy 1:00 1.5 cm medial to the dominant mass fibrocystic changes; ultrasound and mammogram 02/23/2016: 2.5 cm mass at 1:00 position and a small 37m irregular mass 1.5 cm medial to the larger mass, T2 N0 stage II a clinical stage  Breast MRI 03/04/2016: 2.9 cm irregular enhancing mass located within the right breast at 1:00 position, several T2 bright lesions in the right lobe of the liver incompletely visualized could be hepatic cysts.  Recommendationbased on multidisciplinary tumor board: 1. Neoadjuvant chemotherapy with TCH Perjeta 6 cycles followed by Herceptin and Perjeta maintenance for 1 year 03/22/2016- 07/05/2017 2. Breast conserving surgery if possible with sentinel lymph node study: 08/08/2016 3. Followed by adjuvant radiation therapy 4. 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 I discussed the margins as well as lymph node surgeries. We also discussed the final staging along with previously performed ER/PR and HER-2/neu testing.  Patient was counseled extensively on the pathology report which showed a complete pathologic response. Return to clinic after radiation to discuss the pros and cons of antiestrogen therapy

## 2016-08-16 NOTE — Progress Notes (Signed)
Patient Care Team: Tobe Sos, MD as PCP - General (Internal Medicine) Viona Gilmore Evette Cristal, MD as Consulting Physician (Obstetrics and Gynecology) [name], MD as Consulting Physician (General Surgery) Nicholas Lose, MD as Consulting Physician (Hematology and Oncology) Kyung Rudd, MD as Consulting Physician (Radiation Oncology)  DIAGNOSIS:  Encounter Diagnosis  Name Primary?  . Malignant neoplasm of upper-outer quadrant of right breast in female, estrogen receptor positive ([place])     SUMMARY OF ONCOLOGIC HISTORY:   Malignant neoplasm of upper-outer quadrant of right female breast ([place])   02/24/2016 Initial Diagnosis    Right breast biopsy 1:00, grade 2-3 IDC, ER 5%, PR 0%, Ki-67 90%, HER-2 positive ratio 7.28, right breast biopsy 1:00 1.5 cm medial to the dominant mass fibrocystic changes; ultrasound and mammogram 02/23/2016: 2.5 cm mass at 1:00 position and a small 53m irregular mass 1.5 cm medial to the larger mass, T2 N0 stage II a clinical stage      03/04/2016 Breast MRI    2.9 cm irregular enhancing mass located within the right breast at 1:00 position, several T2 bright lesions in the right lobe of the liver incompletely visualized could be hepatic cysts      03/22/2016 - 07/05/2016 Neo-Adjuvant Chemotherapy    Neo-Adj TCHP 5 cycles followed by Herceptin and Perjeta      07/07/2016 Breast MRI    Right breast cancer: No measurable enhancement, residual 1-2 mm foci; left breast partially cystic lesion at 2:30 position 2.1 cm which is new 1 indeterminate left axillary lymph node      08/09/2016 Surgery    Right lumpectomy: 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, no residual invasive cancer, 0/4 lymph nodes negative, ER 5% week, PR negative, HER-2 positive, Ki-67 90%: Complete pathologic response ypTis ypN0      CHIEF COMPLIANT: Follow-up after recent right lumpectomy  INTERVAL HISTORY: Brenda LAMOREAUXis a 61year old with above-mentioned history of right breast cancer who underwent  recent lumpectomy after finishing neoadjuvant chemotherapy. She is here today to discuss pathology report and to discuss a treatment plan. She had a complete pathologic response on the lumpectomy. She is accompanied by her husband and is here today to discuss overall treatment plan. She is sore underneath the axilla but otherwise doing well  REVIEW OF SYSTEMS:   Constitutional: Denies fevers, chills or abnormal weight loss Eyes: Denies blurriness of vision Ears, nose, mouth, throat, and face: Denies mucositis or sore throat Respiratory: Denies cough, dyspnea or wheezes Cardiovascular: Denies palpitation, chest discomfort Gastrointestinal:  Denies nausea, heartburn or change in bowel habits Skin: Denies abnormal skin rashes Lymphatics: Denies new lymphadenopathy or easy bruising Neurological:Denies numbness, tingling or new weaknesses Behavioral/Psych: Mood is stable, no new changes  Extremities: No lower extremity edema All other systems were reviewed with the patient and are negative.  I have reviewed the past medical history, past surgical history, social history and family history with the patient and they are unchanged from previous note.  ALLERGIES:  is allergic to propoxyphene; inapsine [droperidol]; latex; and levaquin [levofloxacin].  MEDICATIONS:  Current Outpatient Prescriptions  Medication Sig Dispense Refill  . albuterol (PROVENTIL HFA;VENTOLIN HFA) 108 (90 Base) MCG/ACT inhaler Inhale 2 puffs into the lungs 2 (two) times daily as needed for wheezing or shortness of breath.     . ALPRAZolam (XANAX) 0.5 MG tablet Take 1 tablet (0.5 mg total) by mouth 2 (two) times daily as needed. (Patient taking differently: Take 0.5 mg by mouth 2 (two) times daily as needed (for anxiety). )  60 tablet 2  . amoxicillin-clavulanate (AUGMENTIN) 875-125 MG tablet Take 1 tablet by mouth 2 (two) times daily. 20 tablet 0  . betamethasone dipropionate (DIPROLENE) 0.05 % cream Apply 1 application  topically every Monday, Wednesday, and Friday. Applied to vagina    . chlorpheniramine-HYDROcodone (TUSSIONEX PENNKINETIC ER) 10-8 MG/5ML SUER Take 5 mLs by mouth at bedtime as needed for cough.    . Cholecalciferol (VITAMIN D3 PO) Take 2,500 Units by mouth 2 (two) times a week.     . diphenoxylate-atropine (LOMOTIL) 2.5-0.025 MG tablet Take 1 tablet by mouth 4 (four) times daily as needed for diarrhea or loose stools. 30 tablet 0  . GARLIC PO Take 6,010 mg by mouth daily.     Marland Kitchen guaiFENesin (MUCINEX) 600 MG 12 hr tablet Take 600 mg by mouth 2 (two) times daily as needed for cough or to loosen phlegm.     . hydrOXYzine (ATARAX/VISTARIL) 25 MG tablet Take 1 tablet (25 mg total) by mouth 3 (three) times daily as needed. (Patient not taking: Reported on 07/24/2016) 60 tablet 3  . ibuprofen (ADVIL,MOTRIN) 800 MG tablet 800 mg every 8 (eight) hours as needed (for pain.).     Marland Kitchen lansoprazole (PREVACID) 30 MG capsule Take 30 mg by mouth daily.     Marland Kitchen levocetirizine (XYZAL) 5 MG tablet Take 5 mg by mouth every evening.    Marland Kitchen LORazepam (ATIVAN) 0.5 MG tablet Take 1 tablet (0.5 mg total) by mouth at bedtime. (Patient not taking: Reported on 07/24/2016) 30 tablet 0  . magic mouthwash w/lidocaine SOLN Take 5 mLs by mouth 3 (three) times daily as needed for mouth pain. (Patient not taking: Reported on 06/12/2016) 150 mL 1  . montelukast (SINGULAIR) 10 MG tablet Take 10 mg by mouth daily.     . ondansetron (ZOFRAN) 8 MG tablet Take 1 tablet (8 mg total) by mouth 2 (two) times daily as needed for refractory nausea / vomiting. Start on day 3 after chemo. (Patient taking differently: Take 8 mg by mouth 2 (two) times daily as needed for nausea. ) 30 tablet 1  . oxyCODONE (OXY IR/ROXICODONE) 5 MG immediate release tablet Take 1-2 tablets (5-10 mg total) by mouth every 6 (six) hours as needed for moderate pain, severe pain or breakthrough pain. 20 tablet 0  . PREMARIN vaginal cream Place 1 Applicatorful vaginally 2 (two) times  a week. Tuesday & Saturday    . prochlorperazine (COMPAZINE) 10 MG tablet Take 1 tablet (10 mg total) by mouth every 6 (six) hours as needed (Nausea or vomiting). (Patient taking differently: Take 10 mg by mouth every 6 (six) hours as needed (Nausea or vomiting). For nausea/vomiting) 30 tablet 1  . spironolactone (ALDACTONE) 50 MG tablet Take 50 mg by mouth 2 (two) times daily.     . Triamcinolone Acetonide (NASACORT ALLERGY 24HR NA) Place 2 sprays into the nose daily.     . verapamil (CALAN-SR) 240 MG CR tablet Take 240 mg by mouth daily.     No current facility-administered medications for this visit.    Facility-Administered Medications Ordered in Other Visits  Medication Dose Route Frequency Provider Last Rate Last Dose  . sodium chloride flush (NS) 0.9 % injection 10 mL  10 mL Intracatheter PRN Nicholas Lose, MD   10 mL at 08/16/16 1354    PHYSICAL EXAMINATION: ECOG PERFORMANCE STATUS: 1 - Symptomatic but completely ambulatory  Vitals:   08/16/16 0936  BP: 126/67  Pulse: 78  Resp: 18  Temp: 97.7 F (  36.5 C)   Filed Weights   08/16/16 0936  Weight: 154 lb 11.2 oz (70.2 kg)    GENERAL:alert, no distress and comfortable SKIN: skin color, texture, turgor are normal, no rashes or significant lesions EYES: normal, Conjunctiva are pink and non-injected, sclera clear OROPHARYNX:no exudate, no erythema and lips, buccal mucosa, and tongue normal  NECK: supple, thyroid normal size, non-tender, without nodularity LYMPH:  no palpable lymphadenopathy in the cervical, axillary or inguinal LUNGS: clear to auscultation and percussion with normal breathing effort HEART: regular rate & rhythm and no murmurs and no lower extremity edema ABDOMEN:abdomen soft, non-tender and normal bowel sounds MUSCULOSKELETAL:no cyanosis of digits and no clubbing  NEURO: alert & oriented x 3 with fluent speech, no focal motor/sensory deficits EXTREMITIES: No lower extremity edema  LABORATORY DATA:  I have  reviewed the data as listed   Chemistry      Component Value Date/Time   NA 141 08/16/2016 0918   K 4.0 08/16/2016 0918   CL 104 03/20/2016 0927   CO2 29 08/16/2016 0918   BUN 26.1 (H) 08/16/2016 0918   CREATININE 1.3 (H) 08/16/2016 0918      Component Value Date/Time   CALCIUM 9.6 08/16/2016 0918   ALKPHOS 172 (H) 08/16/2016 0918   AST 16 08/16/2016 0918   ALT 8 08/16/2016 0918   BILITOT 0.63 08/16/2016 0918       Lab Results  Component Value Date   WBC 5.2 08/16/2016   HGB 11.8 08/16/2016   HCT 34.9 08/16/2016   MCV 105.2 (H) 08/16/2016   PLT 247 08/16/2016   NEUTROABS 3.1 08/16/2016    ASSESSMENT & PLAN:  Malignant neoplasm of upper-outer quadrant of right female breast (HCC) Right breast biopsy 1:00, grade 2-3 IDC, ER 5%, PR 0%, Ki-67 90%, HER-2 positive ratio 7.28, right breast biopsy 1:00 1.5 cm medial to the dominant mass fibrocystic changes; ultrasound and mammogram 02/23/2016: 2.5 cm mass at 1:00 position and a small 77m irregular mass 1.5 cm medial to the larger mass, T2 N0 stage II a clinical stage  Breast MRI 03/04/2016: 2.9 cm irregular enhancing mass located within the right breast at 1:00 position, several T2 bright lesions in the right lobe of the liver incompletely visualized could be hepatic cysts.  Recommendationbased on multidisciplinary tumor board: 1. Neoadjuvant chemotherapy with TCH Perjeta 6 cycles followed by Herceptin and Perjeta maintenance for 1 year 03/22/2016- 07/05/2017 2. Breast conserving surgery if possible with sentinel lymph node study: 08/08/2016 3. Followed by adjuvant radiation therapy 4. Followed by antiestrogen therapy  Right lumpectomy 08/08/2016: 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, no residual invasive cancer, 0/4 lymph nodes negative, ER 5% week, PR negative, HER-2 positive, Ki-67 90%: Complete pathologic response ypTis ypN0 Patient will be seen radiation oncology next week.  Pathology counseling: I discussed the final pathology  report of the patient provided  a copy of this report. I discussed the margins as well as lymph node surgeries. We also discussed the final staging along with previously performed ER/PR and HER-2/neu testing.  Patient was counseled extensively on the pathology report which showed a complete pathologic response. Return to clinic after radiation to discuss the pros and cons of antiestrogen therapy    I spent 25 minutes talking to the patient of which more than half was spent in counseling and coordination of care.  Orders Placed This Encounter  Procedures  . Ambulatory referral to Physical Therapy    Referral Priority:   Routine    Referral Type:  Physical Medicine    Referral Reason:   Specialty Services Required    Requested Specialty:   Physical Therapy    Number of Visits Requested:   1   The patient has a good understanding of the overall plan. she agrees with it. she will call with any problems that may develop before the next visit here.   Rulon Eisenmenger, MD 08/16/16

## 2016-08-17 ENCOUNTER — Telehealth: Payer: Self-pay | Admitting: *Deleted

## 2016-08-17 ENCOUNTER — Ambulatory Visit: Payer: Medicare FFS | Attending: Hematology and Oncology | Admitting: Physical Therapy

## 2016-08-17 DIAGNOSIS — C50211 Malignant neoplasm of upper-inner quadrant of right female breast: Secondary | ICD-10-CM | POA: Diagnosis present

## 2016-08-17 DIAGNOSIS — R222 Localized swelling, mass and lump, trunk: Secondary | ICD-10-CM | POA: Insufficient documentation

## 2016-08-17 DIAGNOSIS — R293 Abnormal posture: Secondary | ICD-10-CM | POA: Diagnosis present

## 2016-08-17 DIAGNOSIS — Z483 Aftercare following surgery for neoplasm: Secondary | ICD-10-CM | POA: Diagnosis present

## 2016-08-17 DIAGNOSIS — Z170 Estrogen receptor positive status [ER+]: Secondary | ICD-10-CM | POA: Diagnosis present

## 2016-08-17 NOTE — Progress Notes (Addendum)
Location of Breast Cancer:Right upper-outer quadrant  Histology per Pathology Report: Diagnosis  08-09-16 1. Breast, lumpectomy, Right w/seed - HIGH GRADE DUCTAL CARCINOMA IN SITU, SPANNING APPROXIMATELY 0.9 CM. - CARCINOMA IN SITU IS LESS THAN 0.1 CM OF THE POSTERIOR MARGIN FOCALLY. - NO RESIDUAL INVASION CARCINOMA IDENTIFIED. - SEE ONCOLOGY TABLE. 2. Lymph node, sentinel, biopsy, Right Axillary #1 - ONE OF ONE LYMPH NODES NEGATIVE FOR CARCINOMA (0/1). 3. Lymph node, sentinel, biopsy, Right Axillary #2 - ONE OF ONE LYMPH NODES NEGATIVE FOR CARCINOMA (0/1). 4. Lymph node, sentinel, biopsy, Right Axillary #3 - ONE OF ONE LYMPH NODES NEGATIVE FOR CARCINOMA (0/1). 5. Lymph node, sentinel, biopsy, Right - ONE OF ONE LYMPH NODES NEGATIVE FOR CARCINOMA (0/1).  Receptor Status: ER(5% +), PR (-), Her2-neu (+), Ki-(90%)  Did patient present with symptoms (if so, please note symptoms) or was this found on screening mammography?: Felt it on her breast.  Past/Anticipated interventions by surgeon, if any:08-09-16 Breast, lumpectomy, Right w/seed Dr. [name]  Past/Anticipated interventions by medical oncology, if any: Neoadjuvant chemotherapy with [facility] Perjeta 6 cycles followed by Herceptinand Perjetamaintenance for 1 year 03/22/2016- 07/05/2017  Followed by adjuvant radiation therapy  Followed by antiestrogen therapy   Lymphedema issues, if any:  No  Pain issues, if any:  No  SAFETY ISSUES:  Prior radiation? : No  Pacemaker/ICD? : No  Possible current pregnancy?: No  Is the patient on methotrexate? :NO Menarche [age] y.o., G 2 P2, BC 36 years, Menopause 57, HRT No Skin to right breast swelling to axilla wearing a compression breast, saw PT today  For the swelling and will have three more appointments, having tenderness and shooting pain at intervals, skin intact with normal color.  Current Complaints / other details: 61 year old woman married,family history of breast cancer  maternal aunt,cousin, colon cancer cousin Wt Readings from Last 3 Encounters:  08/21/16 157 lb 3.2 oz (71.3 kg)  08/16/16 154 lb 11.2 oz (70.2 kg)  08/09/16 156 lb (70.8 kg)  BP 133/80   Pulse 100   Temp 98.2 F (36.8 C) (Oral)   Resp 18   Ht '5\' 5"'  (1.651 m)   Wt 157 lb 3.2 oz (71.3 kg)   SpO2 100%   BMI 26.16 kg/m .      Georgena Spurling, RN 08/17/2016,2:56 PM

## 2016-08-17 NOTE — Therapy (Signed)
[place], [place], [address] Phone: [phone]   Fax:  [phone]  Physical Therapy Evaluation  Patient Details  Name: Brenda Harding MRN: [id number] Date of Birth: October 02, 1955 Referring Provider: Dr. Lindi Adie   Encounter Date: 08/17/2016      PT End of Session - 08/17/16 1806    Visit Number 1   Number of Visits 5   Date for PT Re-Evaluation 09/16/16   PT Start Time 1300   PT Stop Time 1405   PT Time Calculation (min) 65 min   Activity Tolerance Patient tolerated treatment well   Behavior During Therapy [facility] for tasks assessed/performed      Past Medical History:  Diagnosis Date  . Anxiety   . Arthritis   . Bronchitis    HISTORY   . Cough productive of purulent sputum    LIGHT GREEN   . GERD (gastroesophageal reflux disease)   . Headache   . History of wheezing   . Hypertension   . Malignant neoplasm of upper-outer quadrant of right female breast ([place]) 02/25/2016    Past Surgical History:  Procedure Laterality Date  . BACK SURGERY     scoliosis spinal surgery 1970  . benign tumor removal     1978  . BREAST LUMPECTOMY WITH RADIOACTIVE SEED AND SENTINEL LYMPH NODE BIOPSY Right 08/09/2016   Procedure: RIGHT BREAST LUMPECTOMY WITH RADIOACTIVE SEED AND SENTINEL LYMPH NODE BIOPSY;  Surgeon: [name], MD;  Location: [place];  Service: General;  Laterality: Right;  . CARPAL TUNNEL RELEASE    . PORTACATH PLACEMENT Left 03/21/2016   Procedure: INSERTION PORT-A-CATH;  Surgeon: [name], MD;  Location: [place];  Service: General;  Laterality: Left;  . TRIGGER FINGER RELEASE      There were no vitals filed for this visit.       Subjective Assessment - 08/17/16 1804    Subjective Pt said she developed some swelling in left breast after and infusion of chemo and fluids    Patient is accompained by: Family member  daughter [name] is an OT and very supportive    Pertinent History right  breast cancer with lumpectomy with 4 nodes removed on 08/09/2016,  She has undergone chemotherapy( had problems with hand and foot disease with peeling of the skin and sometimes has tingling in the feet and left thumb ) and still has having infusion of antibodies every 3 weeks.  She has to have radiation and will have consult on Monday.  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 Lindi Adie    Onset Date/Surgical Date 08/09/16   Hand Dominance Right     Precautions   Precautions Other (comment)   Precaution Comments previous chemotheapy      Restrictions   Weight Bearing Restrictions No     Balance Screen   Has the patient fallen in the past 6 months No   Has the patient had a decrease in activity level because of a fear of falling?  No   Is the patient reluctant to leave their home because of a fear of falling?  No  Home Environment   Living Environment Private residence   Living Arrangements Spouse/significant other   Available Help at Discharge Available PRN/intermittently   Type of [place]     Prior Function   Level of Independence Independent with basic ADLs   Vocation Retired   Leisure does not exericse, but is open to learning different types of exercise to do.      Cognition   Overall Cognitive Status Within Functional Limits for tasks assessed     Observation/Other Assessments   Observations Pt with visible scoliosis with posterior protruding rib hump on the right.  Full area just above axillary incision that is firm to the touch    Skin Integrity Healing incision at right anterior axilla with skin glue and steri strips still intact.  Healing incision around right nipple   Other Surveys  --  lymphedema life  impact scale 15 or 22 % impaired      Observation/Other Assessments-Edema    Edema --  yes, only at right anterior axillan none in arm      Sensation   Light Touch Not tested     Coordination   Gross Motor Movements are Fluid and Coordinated Yes     Posture/Postural Control   Posture/Postural Control Postural limitations   Postural Limitations Rounded Shoulders;Forward head;Increased thoracic kyphosis   Posture Comments significant scoliosis with right rib hump      ROM / Strength   AROM / PROM / Strength AROM;Strength     AROM   Overall AROM  Within functional limits for tasks performed   AROM Assessment Site Shoulder   Right/Left Shoulder Right   Right Shoulder Flexion 155 Degrees   Right Shoulder ABduction 155 Degrees   Right Shoulder External Rotation 90 Degrees     Strength   Overall Strength Within functional limits for tasks performed                   [facility] Adult PT Treatment/Exercise - 08/17/16 0001      Self-Care   Self-Care Other Self-Care Comments   Other Self-Care Comments  significant time spent on education of antomy and physicology of lymph system and components of complete decongestive therapy treatment to answer pts questions fully   Also provided patch of 1/2 foam with small dotted peach foam for pt to wear at site of edema to encourage body to absorb fluid  and suggested she wear this under pink binder or a snug tshirt at home . enduraged pt not to wear underwire bra as it presses in on breast.   Educated pt and daughter about the many benefits of exercise and recommended Tai Chi for now                 PT Education - 08/17/16 1804    Education provided Yes   Education Details issued flyer for ABC class.  encouraged pt to try to find community exercise for Tai Chi    Person(s) Educated Patient;Child(ren)   Methods Explanation;Handout   Comprehension Verbalized understanding                Long Term Clinic Goals - 08/17/16  1812      CC Long Term Goal  #1   Title Pt will be independent in  self manual lymph drainage and use of compression to swollen area at right axilla    Time 4   Period Weeks   Status New     CC Long Term Goal  #  2   Title Pt will be knowledgeable of lymphedema risk reduction or be signed up for ABC class    Time 4   Period Weeks   Status New     CC Long Term Goal  #3   Title Pt will report she sees a 50% reduction in the swelling at anterior axilla    Time 4   Period Weeks   Status New            Plan - 08/17/16 1806    Clinical Impression Statement Past history significant for scoliosis with failed Harrington rods in the 1970's and recent chemotherapy presents with fluid collection above axillary scar after 4 nodes resected during lumpectomy 8 days ago. Pt lives in [place] and may not want to travel to [place] for treatment,  Daughter is an OT an will attend sessions as she is able. Beacause of comorbidities and evolving status of fluid accumulaiton this is a moderately complex eval    Rehab Potential Excellent   Clinical Impairments Affecting Rehab Potential significant scoliosis, past chemo, 4 nodes removed    PT Frequency 2x / week  4 sessions over 4 weeks    PT Duration 2 weeks   PT Treatment/Interventions ADLs/Self Care Home Management;Patient/family education;Taping;Manual techniques;Manual lymph drainage;DME Instruction;Therapeutic exercise   PT Next Visit Plan Measure arm circumference as there wasnt' time on eval, assess benefit of foam compression. Teach manual lymph draianage, try kinesiotape    Consulted and Agree with Plan of Care Patient      Patient will benefit from skilled therapeutic intervention in order to improve the following deficits and impairments:  Increased edema, Postural dysfunction, Decreased knowledge of use of DME, Decreased knowledge of precautions  Visit Diagnosis: Abnormal posture - Plan: PT plan of care cert/re-cert  Aftercare  following surgery for neoplasm - Plan: PT plan of care cert/re-cert  Localized swelling, mass and lump, trunk - Plan: PT plan of care cert/re-cert      G-Codes - 11/64/35 1814    Functional Assessment Tool Used (Outpatient Only) lymphedema life impact scale    Functional Limitation Self care   Self Care Current Status (T9122) At least 20 percent but less than 40 percent impaired, limited or restricted   Self Care Goal Status (Z8346) At least 1 percent but less than 20 percent impaired, limited or restricted       Problem List Patient Active Problem List   Diagnosis Date Noted  . Bronchitis 07/26/2016  . Malignant neoplasm of upper-outer quadrant of right breast in female, estrogen receptor positive ([place]) 03/01/2016  . Malignant neoplasm of upper-outer quadrant of right female breast ([place]) 02/25/2016   Donato Heinz. Owens Shark PT  [place]o 08/17/2016, [place], [place], [address] Phone: [phone]   Fax:  [phone]  Name: KENADIE ROYCE MRN: [id number] Date of Birth: Jun 24, 1955

## 2016-08-17 NOTE — Patient Instructions (Signed)
First of all, check with your insurance company to see if provider is in network   [facility]                                            [address]  [place], [place] [address] [phone]    Does not file for insurance--- call for appointment with Cathy  [facility]   (for wigs and compression sleeves / gloves/gauntlets )  [address] [place], [place] [address] [phone]  Will file some insurances --- call for appointment   [facility] (for mastectomy prosthetics and garments) [address] [place], [place] [address] [phone] Will file some insurances --- call for appointment  [facility]  [address]  , [place] [address] [phone] Lower extremity garments  [facility] [address] [place],   [address] [phone]  [employer] Sales rep:  Matt Lawson:  [phone] [email or web address] Biocompression pumps   [employer]  Sales rep: Robert Rollins:  [phone] [email or web address] Entre and Flexitouch pumps    Other Resources: National Lymphedema Network:  www.lymphnet.org [email or web address] for patient articles and purchase a self manual lymph drainage DVD [email or web address] has informative articles.  

## 2016-08-17 NOTE — Telephone Encounter (Signed)
FYI Patient reports she "will return call to PT to be seen today.  I was offered appointment to be seen today due to a cancellation."  "I had right breast lumpectomy with lymph node removal eight days ago.  I have been doing well until yesterday.  I received infusion and the right breast is really swollen at the incision line.  I have pain even in the shoulder area.  Took motrin, used cool compresses and elevated my arm so it's better.  Is this normal.  I've been doing laundry and light things around the house.  I thought I was supposed to exercise and use the arm."  Will notify provider.   Nursing instructions provided her treatment plan includes Physical Therapist to teach exercises to help with swelling and discomfort..  Yes, pain in this area is normal along with swelling however, with this change, do you normal activities of bathing, combing hair etc. (ADL's).  Decrease house work activities to prevent over use of your upper body.  [facility] Physical Therapist that work with cancer patients are certified lymphedema therapist.

## 2016-08-21 ENCOUNTER — Ambulatory Visit: Payer: Medicare FFS | Admitting: Hematology and Oncology

## 2016-08-21 ENCOUNTER — Ambulatory Visit: Payer: Medicare FFS

## 2016-08-21 ENCOUNTER — Ambulatory Visit
Admission: RE | Admit: 2016-08-21 | Discharge: 2016-08-21 | Disposition: A | Discharge status: Home / Self Care | Payer: Medicare FFS | Source: Ambulatory Visit | Attending: Radiation Oncology | Admitting: Radiation Oncology

## 2016-08-21 ENCOUNTER — Encounter: Payer: Self-pay | Admitting: Radiation Oncology

## 2016-08-21 ENCOUNTER — Ambulatory Visit: Payer: Medicare FFS | Admitting: Physical Therapy

## 2016-08-21 VITALS — BP 133/80 | HR 100 | Temp 98.2°F | Resp 18 | Ht 65.0 in | Wt 157.2 lb

## 2016-08-21 DIAGNOSIS — R05 Cough: Secondary | ICD-10-CM | POA: Diagnosis not present

## 2016-08-21 DIAGNOSIS — R222 Localized swelling, mass and lump, trunk: Secondary | ICD-10-CM

## 2016-08-21 DIAGNOSIS — Z170 Estrogen receptor positive status [ER+]: Secondary | ICD-10-CM | POA: Diagnosis not present

## 2016-08-21 DIAGNOSIS — R51 Headache: Secondary | ICD-10-CM | POA: Diagnosis not present

## 2016-08-21 DIAGNOSIS — Z510 Encounter for antineoplastic radiation therapy: Secondary | ICD-10-CM | POA: Diagnosis not present

## 2016-08-21 DIAGNOSIS — Z483 Aftercare following surgery for neoplasm: Secondary | ICD-10-CM

## 2016-08-21 DIAGNOSIS — K219 Gastro-esophageal reflux disease without esophagitis: Secondary | ICD-10-CM | POA: Diagnosis not present

## 2016-08-21 DIAGNOSIS — C50411 Malignant neoplasm of upper-outer quadrant of right female breast: Secondary | ICD-10-CM

## 2016-08-21 DIAGNOSIS — F419 Anxiety disorder, unspecified: Secondary | ICD-10-CM | POA: Insufficient documentation

## 2016-08-21 DIAGNOSIS — R293 Abnormal posture: Secondary | ICD-10-CM | POA: Diagnosis not present

## 2016-08-21 DIAGNOSIS — I10 Essential (primary) hypertension: Secondary | ICD-10-CM | POA: Diagnosis not present

## 2016-08-21 NOTE — Progress Notes (Signed)
Radiation Oncology         [phone]) [phone] ________________________________  Name: Brenda Harding MRN: [id number]  Date: 08/21/2016  DOB: Dec 04, 1955  DG:LOVFIEP,PIRJJOA K, MD  Nicholas Lose, MD     REFERRING PHYSICIAN: Nicholas Lose, MD   DIAGNOSIS: The encounter diagnosis was Malignant neoplasm of upper-outer quadrant of right female breast, unspecified estrogen receptor status ([place]).   HISTORY OF PRESENT ILLNESS: Brenda Harding is a 61 y.o. female originally seen in the multidisciplinary clinic for a new diagnosis of right breast cancer. The patient had a screening mammogram in January 2017at which time she had normal findings. The patient noted a palpable mass however which prompted diagnostic right mammogram on 02/23/2016, which revealed a mass in the right breast. Targeted right breast ultrasound a 2.5 x 2 x 1.8 cm mass at 1:00 5 cm from the nipple with central calcifications. A smaller irregular hypoechoic mass with measured at 3 x 3 x 3 mm. Ultrasound of the right axilla demonstrated no adenopathy. 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 The second lesion that has been biopsied did not reveal any evidence of disease.   She completed 5 cycles of neoadjuvant carboplatin/taxotere between 03/22/16-06/14/16. She continues Herceptin and will complete this in November 2018. She underwent surgical resection on 08/09/16. Her tumor measured 15m and was insitu disease. It was 162maway from the posterior margin. Her lymph nodes were all negative for disease.   PREVIOUS RADIATION THERAPY: No   PAST MEDICAL HISTORY:  Past Medical History:  Diagnosis Date  . Anxiety   . Arthritis   . Bronchitis    HISTORY   . Cough productive of purulent sputum    LIGHT GREEN   . GERD (gastroesophageal reflux disease)   . Headache   . History of wheezing   . Hypertension   . Malignant ne[place] of upper-outer quadrant of  right [place] breast [place]       PAST SURGICAL HISTORY: Past Surgical History:  Procedure Laterality Date  . BACK SURGERY     scoliosis spinal surgery 1970  . benign tumor removal     1978  . BREAST LUMPECTOMY WITH RADIOACTIVE SEED AND SENTINEL LYMPH NODE BIOPSY Right 08/09/2016   Procedure: RIGHT BREAST LUMPECTOMY WITH RADIOACTIVE SEED AND SENTINEL LYMPH NODE BIOPSY;  Surgeon: FaStark KleinMD;  [facility] Service: General;  Laterality: Right;  . CARPAL TUNNEL RELEASE    . PORTACATH PLACEMENT Left 03/21/2016   Procedure: INSERTION PORT-A-CATH;  Surgeon: FaStark KleinMD;  Location: [facility] Service: General;  Laterality: Left;  . TRIGGER FINGER RELEASE       FAMILY HISTORY:  Family History  Problem Relation Age of Onset  . Breast cancer Maternal Aunt   . Breast cancer Cousin   . Colon cancer Cousin      SOCIAL HISTORY:  reports that she has never smoked. She has never used smokeless tobacco. She reports that she does not drink alcohol or use drugs. [place]   ALLERGIES: Propoxyphene; Inapsine [droperidol]; Latex; and Levaquin [levofloxacin]   MEDICATIONS:  Current Outpatient Prescriptions  Medication Sig Dispense Refill  . ALPRAZolam (XANAX) 0.5 MG tablet Take 1 tablet (0.5 mg total) by mouth 2 (two) times daily as needed. (Patient taking differently: Take 0.5 mg by mouth 2 (two) times  daily as needed (for anxiety). ) 60 tablet 2  . betamethasone dipropionate (DIPROLENE) 0.05 % cream Apply 1 application topically every Monday, Wednesday, and Friday. Applied to vagina    . Biotin 10000 MCG TABS Take by mouth.    . Cholecalciferol (VITAMIN D3 PO) Take 2,500 Units by mouth 2 (two) times a week.     Marland Kitchen GARLIC PO Take 9,562 mg by mouth daily.     Marland Kitchen ibuprofen (ADVIL,MOTRIN) 800 MG tablet 800 mg every 8 (eight) hours as needed (for pain.).     Marland Kitchen lansoprazole  (PREVACID) 30 MG capsule Take 30 mg by mouth daily.     Marland Kitchen levocetirizine (XYZAL) 5 MG tablet Take 5 mg by mouth every evening.    . montelukast (SINGULAIR) 10 MG tablet Take 10 mg by mouth daily.     Marland Kitchen PREMARIN vaginal cream Place 1 Applicatorful vaginally 2 (two) times a week. Tuesday & Saturday    . Triamcinolone Acetonide (NASACORT ALLERGY 24HR NA) Place 2 sprays into the nose daily.     . verapamil (CALAN-SR) 240 MG CR tablet Take 240 mg by mouth daily.    Marland Kitchen albuterol (PROVENTIL HFA;VENTOLIN HFA) 108 (90 Base) MCG/ACT inhaler Inhale 2 puffs into the lungs 2 (two) times daily as needed for wheezing or shortness of breath.     . chlorpheniramine-HYDROcodone (TUSSIONEX PENNKINETIC ER) 10-8 MG/5ML SUER Take 5 mLs by mouth at bedtime as needed for cough.    . diphenoxylate-atropine (LOMOTIL) 2.5-0.025 MG tablet Take 1 tablet by mouth 4 (four) times daily as needed for diarrhea or loose stools. (Patient not taking: Reported on 08/21/2016) 30 tablet 0  . guaiFENesin (MUCINEX) 600 MG 12 hr tablet Take 600 mg by mouth 2 (two) times daily as needed for cough or to loosen phlegm.     . hydrOXYzine (ATARAX/VISTARIL) 25 MG tablet Take 1 tablet (25 mg total) by mouth 3 (three) times daily as needed. (Patient not taking: Reported on 07/24/2016) 60 tablet 3  . LORazepam (ATIVAN) 0.5 MG tablet Take 1 tablet (0.5 mg total) by mouth at bedtime. (Patient not taking: Reported on 07/24/2016) 30 tablet 0  . magic mouthwash w/lidocaine SOLN Take 5 mLs by mouth 3 (three) times daily as needed for mouth pain. (Patient not taking: Reported on 06/12/2016) 150 mL 1  . ondansetron (ZOFRAN) 8 MG tablet Take 1 tablet (8 mg total) by mouth 2 (two) times daily as needed for refractory nausea / vomiting. Start on day 3 after chemo. (Patient not taking: Reported on 08/21/2016) 30 tablet 1  . oxyCODONE (OXY IR/ROXICODONE) 5 MG immediate release tablet Take 1-2 tablets (5-10 mg total) by mouth every 6 (six) hours as needed for moderate  pain, severe pain or breakthrough pain. (Patient not taking: Reported on 08/21/2016) 20 tablet 0  . prochlorperazine (COMPAZINE) 10 MG tablet Take 1 tablet (10 mg total) by mouth every 6 (six) hours as needed (Nausea or vomiting). (Patient not taking: Reported on 08/21/2016) 30 tablet 1  . spironolactone (ALDACTONE) 50 MG tablet Take 50 mg by mouth 2 (two) times daily.      No current facility-administered medications for this encounter.      REVIEW OF SYSTEMS: On review of systems, the patient reports that she is doing well overall. She reports that she's had some swelling of her chest wall beneath her right axilla. She has been seen by PT and had kinesiology tape placed. She has a follow up visit to discuss this with Dr. Barry Dienes next  week, but is anxious to move forward with treatment and wants to be able to finishShe denies any chest pain, shortness of breath, cough, fevers, chills, night sweats, unintended weight changes. She denies any bowel or bladder disturbances, and denies abdominal pain, nausea or vomiting. She denies any new musculoskeletal or joint aches or pains. A complete 10 point review of systems is obtained and is otherwise negative.     PHYSICAL EXAM:  Wt Readings from Last 3 Encounters:  08/21/16 157 lb 3.2 oz (71.3 kg)  08/16/16 154 lb 11.2 oz (70.2 kg)  08/09/16 156 lb (70.8 kg)   Temp Readings from Last 3 Encounters:  08/21/16 98.2 F (36.8 C) (Oral)  08/16/16 97.7 F (36.5 C) (Oral)  08/09/16 97.5 F (36.4 C)   BP Readings from Last 3 Encounters:  08/21/16 133/80  08/16/16 126/67  08/09/16 118/69   Pulse Readings from Last 3 Encounters:  08/21/16 100  08/16/16 78  08/09/16 81     Pain scale 0/10 In general this is a well appearing caucasian female in no acute distress. She's alert and oriented x4 and appropriate throughout the examination. Cardiopulmonary assessment is negative for acute distress and she exhibits normal effort.The right breast is healing  well from her recent lumpectomy, no evidence of erythematous changes identified. There is approximately 3 cm of fluctuance any the incision line in the right axilla. This is taped with kinesiology tape. No evidence of cellulitic streaking bleeding or drainage is identified.     ECOG = 1  0 - Asymptomatic (Fully active, able to carry on all predisease activities without restriction)  1 - Symptomatic but completely ambulatory (Restricted in physically strenuous activity but ambulatory and able to carry out work of a light or sedentary nature. For example, light housework, office work)  2 - Symptomatic, <50% in bed during the day (Ambulatory and capable of all self care but unable to carry out any work activities. Up and about more than 50% of waking hours)  3 - Symptomatic, >50% in bed, but not bedbound (Capable of only limited self-care, confined to bed or chair 50% or more of waking hours)  4 - Bedbound (Completely disabled. Cannot carry on any self-care. Totally confined to bed or chair)  5 - Death   Eustace Pen MM, Creech RH, Tormey DC, et al. [phone]). "Toxicity and response criteria of the [facility]". [place] Oncol. 5 (6): 649-55    LABORATORY DATA:  Lab Results  Component Value Date   WBC 5.2 08/16/2016   HGB 11.8 08/16/2016   HCT 34.9 08/16/2016   MCV 105.2 (H) 08/16/2016   PLT 247 08/16/2016   Lab Results  Component Value Date   NA 141 08/16/2016   K 4.0 08/16/2016   CL 104 03/20/2016   CO2 29 08/16/2016   Lab Results  Component Value Date   ALT 8 08/16/2016   AST 16 08/16/2016   ALKPHOS 172 (H) 08/16/2016   BILITOT 0.63 08/16/2016      RADIOGRAPHY: Dg Chest 2 View  Result Date: 07/26/2016 CLINICAL DATA:  Preop for right breast lumpectomy EXAM: CHEST  2 VIEW COMPARISON:  03/21/2016 FINDINGS: Cardiomediastinal silhouette is stable. Left subclavian Port-A-Cath with tip in SVC. No infiltrate or pulmonary edema. Dextroscoliosis thoracic spine  again noted. Again noted fracture /interruption of metallic fixation rod mid thoracic spine. IMPRESSION: No active cardiopulmonary disease.  No significant change. Electronically Signed   By: Lahoma Crocker M.D.   On: 07/26/2016 15:38  Mm Breast Surgical Specimen  Result Date: 08/09/2016 CLINICAL DATA:  Status post right breast lumpectomy after earlier radioactive seed localization. EXAM: SPECIMEN RADIOGRAPH OF THE RIGHT BREAST COMPARISON:  Previous exam(s). FINDINGS: Status post excision of the right breast. The radioactive seed and biopsy marker clip are present, completely intact, and were marked for pathology. The positions of the radioactive seed and biopsy marker clip within the specimen were discussed with the OR staff during the procedure. IMPRESSION: Specimen radiograph of the right breast. Electronically Signed   By: Franki Cabot M.D.   On: 08/09/2016 14:15   Mm Rt Radioactive Seed Loc Mammo Guide  Result Date: 08/08/2016 CLINICAL DATA:  61 year old female for radioactive seed localization of right breast cancer prior to right lumpectomy. EXAM: MAMMOGRAPHIC GUIDED RADIOACTIVE SEED LOCALIZATION OF THE RIGHT BREAST COMPARISON:  Previous exam(s). FINDINGS: Patient presents for radioactive seed localization prior to right lumpectomy. I met with the patient and we discussed the procedure of seed localization including benefits and alternatives. We discussed the high likelihood of a successful procedure. We discussed the risks of the procedure including infection, bleeding, tissue injury and further surgery. We discussed the low dose of radioactivity involved in the procedure. Informed, written consent was given. The usual time-out protocol was performed immediately prior to the procedure. Using mammographic guidance, sterile technique, 1% lidocaine and an I-125 radioactive seed, the ribbon shaped clip was localized using a superior approach. The follow-up mammogram images confirm the seed in the expected  location and were marked for Dr. Barry Dienes. Follow-up survey of the patient confirms presence of the radioactive seed. Order number of I-125 seed:  [id number]. Total activity:  4.098 millicurie  Reference Date: 07/18/2016 The patient tolerated the procedure well and was released from the [place]. She was given instructions regarding seed removal. IMPRESSION: Radioactive seed localization right breast. No apparent complications. Electronically Signed   By: Margarette Canada M.D.   On: 08/08/2016 15:39      IMPRESSION/PLAN: 1. 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 Dr. Lisbeth Renshaw and I have reviewed the pathology findings and  the nature of invasive breast disease. He has recommended moving forward with adjuvant radiotherapy.  We discussed the risks, benefits, short, and long term effects of radiotherapy, and the patient is interested in proceeding. Dr. Lisbeth Renshaw discusses the delivery and logistics of radiotherapy and offers her a course of 6 1/2 weeks of treatment. Written consent is obtained, and a copy is given the patient, the original kept in the chart. We will plan to simulate her next Thursday.  2. Possible axillary incision site seroma. I did encourage the patient to return to Dr. Barry Dienes for assessment of this and she will contact her office for a work in visit. I will copy Dr. Barry Dienes as well. We discussed that we would hold off on simulation is she had a persistent fluid accumulation until this improved or if this was aspirated, allow enough time to make sure that this did not reaccumulate prior to planning. She states agreement and understanding. I will inform Dr. Lisbeth Renshaw of the findings as well, and we will plan for him to meet back with the patient on the day of her simulation  In a visit lasting 45 minutes, greater than 50% of the time was spent face to face discussing her concerns about start dates,  her possible seroma, and coordinating the  patient's care.      Carola Rhine, PAC

## 2016-08-21 NOTE — Patient Instructions (Signed)
Self manual lymph drainage: Perform this sequence once a day.  Only give enough pressure no your skin to make the skin move.  Hug yourself.  Do circles at your neck just above your collarbones.  Repeat this 10 times.  Diaphragmatic - Supine   Inhale through nose making navel move out toward hands. Exhale through puckered lips, hands follow navel in. Repeat _5__ times. Rest _10__ seconds between repeats.    Axilla - One at a Time   Using full weight of flat hand and fingers at center of uninvolved armpit, make _10__ in-place circles.   Copyright  VHI. All rights reserved.  LEG: Inguinal Nodes Stimulation   With small finger side of hand against hip crease on involved side, gently perform circles at the crease. Repeat __10_ times.   Copyright  VHI. All rights reserved.  1) Axilla to Inguinal Nodes - Sweep   On involved side, sweep _4__ times from armpit along side of trunk to hip crease.  Now gently stretch skin from the involved side to the uninvolved side across the chest at the shoulder line.  Repeat that 4 times.  Draw an imaginary diagonal line from upper outer breast through the nipple area toward lower inner breast.  Direct fluid upward and inward from this line toward the pathway across your upper chest .  Do this in three rows to treat all of the upper inner breast tissue, and do each row 3-4x.      Direct fluid to treat all of lower outer breast tissue downward and outward toward pathway that is aimed at the left groin.  Finish by doing the pathways as described above going from your involved armpit to the same side groin and going across your upper chest from the involved shoulder to the uninvolved shoulder.  Repeat the steps above where you do circles in your right groin and left armpit. Copyright  VHI. All rights reserved.

## 2016-08-21 NOTE — Therapy (Signed)
[place], [place], [address] Phone: [phone]   Fax:  [phone]  Physical Therapy Treatment  Patient Details  Name: Brenda Harding MRN: [id number] Date of Birth: 1955/10/06 Referring Provider: Dr. Lindi Adie   Encounter Date: 08/21/2016      PT End of Session - 08/21/16 1221    Visit Number 2   Number of Visits 5   Date for PT Re-Evaluation 09/16/16   PT Start Time 1026   PT Stop Time 1113   PT Time Calculation (min) 47 min   Activity Tolerance Patient tolerated treatment well   Behavior During Therapy [facility] for tasks assessed/performed      Past Medical History:  Diagnosis Date  . Anxiety   . Arthritis   . Bronchitis    HISTORY   . Cough productive of purulent sputum    LIGHT GREEN   . GERD (gastroesophageal reflux disease)   . Headache   . History of wheezing   . Hypertension   . Malignant neoplasm of upper-outer quadrant of right female breast ([place]) 02/25/2016    Past Surgical History:  Procedure Laterality Date  . BACK SURGERY     scoliosis spinal surgery 1970  . benign tumor removal     1978  . BREAST LUMPECTOMY WITH RADIOACTIVE SEED AND SENTINEL LYMPH NODE BIOPSY Right 08/09/2016   Procedure: RIGHT BREAST LUMPECTOMY WITH RADIOACTIVE SEED AND SENTINEL LYMPH NODE BIOPSY;  Surgeon: [name], MD;  Location: [place];  Service: General;  Laterality: Right;  . CARPAL TUNNEL RELEASE    . PORTACATH PLACEMENT Left 03/21/2016   Procedure: INSERTION PORT-A-CATH;  Surgeon: [name], MD;  Location: [place];  Service: General;  Laterality: Left;  . TRIGGER FINGER RELEASE      There were no vitals filed for this visit.      Subjective Assessment - 08/21/16 1030    Subjective I could tell my Rt breast was more swollen yesterday but I forgot to use the foam. It feels better today.   Patient is accompained by: Family member  daughter Brenda Harding   Pertinent History right breast cancer with  lumpectomy with 4 nodes removed on 08/09/2016,  She has undergone chemotherapy( had problems with hand and foot disease with peeling of the skin and sometimes has tingling in the feet and left thumb ) and still has having infusion of antibodies every 3 weeks.  She has to have radiation and will have consult on Monday.  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 No/denies                         [facility] Adult PT Treatment/Exercise - 08/21/16 0001      Manual Therapy   Manual Therapy Manual Lymphatic Drainage (MLD);Taping   Manual Lymphatic Drainage (MLD) In Supine instructing pt throughout:    Kinesiotex Edema     Kinesiotix   Edema Applied skincote first then 3 finger fan along Rt aixillo-inguinal anastomosis.                PT Education - 08/21/16 1220    Education provided Yes   Education Details Instructed pt in self MLD to Rt breast. Also how to safely remove kinesiotape gently in next  3 or so days, but sooner if she feels she is having a reaction.    Person(s) Educated Patient;Child(ren)   Methods Explanation;Demonstration;Handout  Issued pt and daughter handout for MLD   Comprehension Verbalized understanding;Returned demonstration;Need further instruction                [facility] Goals - 08/17/16 1812      CC Long Term Goal  #1   Title Pt will be independent in  self manual lymph drainage and use of compression to swollen area at right axilla    Time 4   Period Weeks   Status New     CC Long Term Goal  #2   Title Pt will be knowledgeable of lymphedema risk reduction or be signed up for ABC class    Time 4   Period Weeks   Status New     CC Long Term Goal  #3   Title Pt will report she sees a 50% reduction in the swelling at anterior axilla    Time 4   Period Weeks    Status New            Plan - 08/21/16 1222    Clinical Impression Statement Pt did well with first session of instruction of self manual lymph drainage and was able to return good demonstration requiring min-mod tactile and VCs for lighter pressure and good skin stretch. She was eager to try tape as well today so applied this along Rt axillo-inguinal anstomosis (avoiding well healing incisions) and instructed pt how to safely remove in next few days but sooner if she feels she is having a reaction. Pt has also gotten a compression bra and been wearing this with the foam issued to her last week and she reports noticing improvement with swelling when she consistently wears these together.   Rehab Potential Excellent   Clinical Impairments Affecting Rehab Potential significant scoliosis, past chemo, 4 nodes removed    PT Frequency 2x / week  4 sessions over 4 weeks   PT Duration 4 weeks   PT Treatment/Interventions ADLs/Self Care Home Management;Patient/family education;Taping;Manual techniques;Manual lymph drainage;DME Instruction;Therapeutic exercise   PT Next Visit Plan Measure arm circumference (ran out of time as pt had alot of questions). Review and cont manual lymph drainage, assess trial of kinesiotape, cont if deemed beneficial   Consulted and Agree with Plan of Care Patient      Patient will benefit from skilled therapeutic intervention in order to improve the following deficits and impairments:  Increased edema, Postural dysfunction, Decreased knowledge of use of DME, Decreased knowledge of precautions  Visit Diagnosis: Abnormal posture  Aftercare following surgery for neoplasm  Localized swelling, mass and lump, trunk     Problem List Patient Active Problem List   Diagnosis Date Noted  . Bronchitis 07/26/2016  . Malignant neoplasm of upper-outer quadrant of right breast in female, estrogen receptor positive ([place]) 03/01/2016  . Malignant neoplasm of upper-outer quadrant  of right female breast ([place]) 02/25/2016    Otelia Limes, PTA 08/21/2016, 12:28 PM  [place], [place], [address] Phone: [phone]   Fax:  [phone]  Name: Brenda Harding MRN: [id number] Date of Birth: 1956/02/18

## 2016-08-24 ENCOUNTER — Ambulatory Visit: Payer: Medicare FFS

## 2016-08-24 DIAGNOSIS — R222 Localized swelling, mass and lump, trunk: Secondary | ICD-10-CM

## 2016-08-24 DIAGNOSIS — R293 Abnormal posture: Secondary | ICD-10-CM

## 2016-08-24 DIAGNOSIS — C50211 Malignant neoplasm of upper-inner quadrant of right female breast: Secondary | ICD-10-CM

## 2016-08-24 DIAGNOSIS — Z483 Aftercare following surgery for neoplasm: Secondary | ICD-10-CM

## 2016-08-24 DIAGNOSIS — Z170 Estrogen receptor positive status [ER+]: Secondary | ICD-10-CM

## 2016-08-24 NOTE — Therapy (Signed)
[place], [place], [address] Phone: [phone]   Fax:  [phone]  Physical Therapy Treatment  Patient Details  Name: BILAN TEDESCO MRN: [id number] Date of Birth: 1955-12-21 Referring Provider: Dr. Lindi Adie   Encounter Date: 08/24/2016      PT End of Session - 08/24/16 1155    Visit Number 3   Number of Visits 5   Date for PT Re-Evaluation 09/16/16   PT Start Time 1109   PT Stop Time 1154   PT Time Calculation (min) 45 min   Activity Tolerance Patient tolerated treatment well   Behavior During Therapy [facility] for tasks assessed/performed      Past Medical History:  Diagnosis Date  . Anxiety   . Arthritis   . Bronchitis    HISTORY   . Cough productive of purulent sputum    LIGHT GREEN   . GERD (gastroesophageal reflux disease)   . Headache   . History of wheezing   . Hypertension   . Malignant neoplasm of upper-outer quadrant of right female breast ([place]) 02/25/2016    Past Surgical History:  Procedure Laterality Date  . BACK SURGERY     scoliosis spinal surgery 1970  . benign tumor removal     1978  . BREAST LUMPECTOMY WITH RADIOACTIVE SEED AND SENTINEL LYMPH NODE BIOPSY Right 08/09/2016   Procedure: RIGHT BREAST LUMPECTOMY WITH RADIOACTIVE SEED AND SENTINEL LYMPH NODE BIOPSY;  Surgeon: [name], MD;  Location: [place];  Service: General;  Laterality: Right;  . CARPAL TUNNEL RELEASE    . PORTACATH PLACEMENT Left 03/21/2016   Procedure: INSERTION PORT-A-CATH;  Surgeon: [name], MD;  Location: [place];  Service: General;  Laterality: Left;  . TRIGGER FINGER RELEASE      There were no vitals filed for this visit.      Subjective Assessment - 08/24/16 1112    Subjective I think the tape helped, the swelling feels softer. And I've tried the massage on myself a few times and I think I'm doing okay. Since I've been I've been able to sleep with my Rt arm under pillow like I used to as  well. I'm seeing the doctor at the surgeons office so they can see the swelling again to see if I can start radiation soon.   Pertinent History right breast cancer with lumpectomy with 4 nodes removed on 08/09/2016,  She has undergone chemotherapy( had problems with hand and foot disease with peeling of the skin and sometimes has tingling in the feet and left thumb ) and still has having infusion of antibodies every 3 weeks.  She has to have radiation and will have consult on Monday.  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 No/denies                         [facility] Adult PT Treatment/Exercise - 08/24/16 0001      Manual Therapy   Manual Therapy Manual Lymphatic Drainage (MLD);Taping   Manual Lymphatic Drainage (MLD) In Supine instructing pt throughout:    Kinesiotex Edema     Kinesiotix   Edema Applied skincote first then 3 finger fan along Rt axillo-inguinal anastomosis stopping tape before incision.  [facility] [facility] Goals - 08/17/16 1812      CC Long Term Goal  #1   Title Pt will be independent in  self manual lymph drainage and use of compression to swollen area at right axilla    Time 4   Period Weeks   Status New     CC Long Term Goal  #2   Title Pt will be knowledgeable of lymphedema risk reduction or be signed up for ABC class    Time 4   Period Weeks   Status New     CC Long Term Goal  #3   Title Pt will report she sees a 50% reduction in the swelling at anterior axilla    Time 4   Period Weeks   Status New            Plan - 08/24/16 1155    Clinical Impression Statement Pt did very well with review of self manual lymph drainage today requiring tactile cuing only at anterior inter-axillary anastomosis. Reapplied tape as before stopping before  incision. Pt has follow up appt today with surgeons office to assess if area of swelling needs to be drained or to continue physical therapy.    Rehab Potential Excellent   Clinical Impairments Affecting Rehab Potential significant scoliosis, past chemo, 4 nodes removed    PT Frequency 2x / week  4 sessions over 4 weeks   PT Duration 4 weeks   PT Treatment/Interventions ADLs/Self Care Home Management;Patient/family education;Taping;Manual techniques;Manual lymph drainage;DME Instruction;Therapeutic exercise   PT Next Visit Plan Measure bil arm circumference. Review and cont manual lymph drainage, kinesiotape if still needed. See how appt with surgeon went today.   PT Home Exercise Plan Cont self manual lymph drainage 1-2x/day   Consulted and Agree with Plan of Care Patient      Patient will benefit from skilled therapeutic intervention in order to improve the following deficits and impairments:  Increased edema, Postural dysfunction, Decreased knowledge of use of DME, Decreased knowledge of precautions  Visit Diagnosis: Abnormal posture  Aftercare following surgery for neoplasm  Localized swelling, mass and lump, trunk  Carcinoma of upper-inner quadrant of right breast in female, estrogen receptor positive ([place])     Problem List Patient Active Problem List   Diagnosis Date Noted  . Bronchitis 07/26/2016  . Malignant neoplasm of upper-outer quadrant of right breast in female, estrogen receptor positive ([place]) 03/01/2016  . Malignant neoplasm of upper-outer quadrant of right female breast ([place]) 02/25/2016    Otelia Limes, PTA 08/24/2016, 12:01 PM  [place], [place], [address] Phone: [phone]   Fax:  [phone]  Name: LUREE PALLA MRN: [id number] Date of Birth: [birth year]

## 2016-08-29 ENCOUNTER — Ambulatory Visit: Payer: Medicare FFS | Attending: Hematology and Oncology

## 2016-08-29 DIAGNOSIS — R293 Abnormal posture: Secondary | ICD-10-CM | POA: Diagnosis not present

## 2016-08-29 DIAGNOSIS — Z483 Aftercare following surgery for neoplasm: Secondary | ICD-10-CM | POA: Insufficient documentation

## 2016-08-29 DIAGNOSIS — R222 Localized swelling, mass and lump, trunk: Secondary | ICD-10-CM

## 2016-08-29 NOTE — Therapy (Signed)
[place], [place], [address] Phone: [phone]   Fax:  [phone]  Physical Therapy Treatment  Patient Details  Name: Brenda Harding MRN: [id number] Date of Birth: 23-Sep-1955 Referring Provider: Dr. Lindi Adie   Encounter Date: 08/29/2016      PT End of Session - 08/29/16 1218    Visit Number 4   Number of Visits 5   Date for PT Re-Evaluation 09/16/16   PT Start Time 1109   PT Stop Time 1159   PT Time Calculation (min) 50 min   Activity Tolerance Patient tolerated treatment well   Behavior During Therapy [name] for tasks assessed/performed      Past Medical History:  Diagnosis Date  . Anxiety   . Arthritis   . Bronchitis    HISTORY   . Cough productive of purulent sputum    LIGHT GREEN   . GERD (gastroesophageal reflux disease)   . Headache   . History of wheezing   . Hypertension   . Malignant neoplasm of upper-outer quadrant of right female breast ([place]) 02/25/2016    Past Surgical History:  Procedure Laterality Date  . BACK SURGERY     scoliosis spinal surgery 1970  . benign tumor removal     1978  . BREAST LUMPECTOMY WITH RADIOACTIVE SEED AND SENTINEL LYMPH NODE BIOPSY Right 08/09/2016   Procedure: RIGHT BREAST LUMPECTOMY WITH RADIOACTIVE SEED AND SENTINEL LYMPH NODE BIOPSY;  Surgeon: [name], MD;  Location: [place];  Service: General;  Laterality: Right;  . CARPAL TUNNEL RELEASE    . PORTACATH PLACEMENT Left 03/21/2016   Procedure: INSERTION PORT-A-CATH;  Surgeon: [name], MD;  Location: [place];  Service: General;  Laterality: Left;  . TRIGGER FINGER RELEASE      There were no vitals filed for this visit.      Subjective Assessment - 08/29/16 1113    Subjective I had my appointment last week with the doctor and he kinda acted like I didn't need that appointment because I was doing so well. But he did say I definitely have a seroma. But I feel like that area of hardness has  even improved since last week. I think I'm good with making today my last appointment.    Pertinent History right breast cancer with lumpectomy with 4 nodes removed on 08/09/2016,  She has undergone chemotherapy( had problems with hand and foot disease with peeling of the skin and sometimes has tingling in the feet and left thumb ) and still has having infusion of antibodies every 3 weeks.  She has to have radiation and will have consult on Monday.  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 No/denies               LYMPHEDEMA/ONCOLOGY QUESTIONNAIRE - 08/29/16 1118      Right Upper Extremity Lymphedema   15 cm Proximal to Olecranon Process 26.8 cm   10 cm Proximal to Olecranon Process 25.6 cm   Olecranon Process 24.6 cm   15 cm Proximal to Ulnar Styloid Process 24.1 cm   10 cm Proximal to Ulnar Styloid Process 21 cm   Just Proximal to Ulnar Styloid Process 15.1 cm   Across Hand at [employer] 18.1 cm   At [place] of  2nd Digit 5.9 cm     Left Upper Extremity Lymphedema   15 cm Proximal to Olecranon Process 29 cm   10 cm Proximal to Olecranon Process 28.2 cm   Olecranon Process 25.5 cm   15 cm Proximal to Ulnar Styloid Process 24 cm   10 cm Proximal to Ulnar Styloid Process 21.5 cm   Just Proximal to Ulnar Styloid Process 15.5 cm   Across Hand at [employer] 17.8 cm   At [place] of 2nd Digit 6.3 cm                  [facility] Adult PT Treatment/Exercise - 08/29/16 0001      Self-Care   Self-Care Other Self-Care Comments   Other Self-Care Comments  Instructed pt in lymphedema risk reduction practices and issued handout for this answerin pts questions throughout     Manual Therapy   Manual Therapy Manual Lymphatic Drainage (MLD);Myofascial release   Myofascial Release Gentle myofascial release at incision where  pt c/o tenderness instructing her in same   Manual Lymphatic Drainage (MLD) In Supine instructing pt throughout per handout issued : short neck, 5 superficial and deep abdominals (pt to do 5 diaphragmatic breaths at home when performing) Rt inguinal and Lt axillary nodes, Rt axillo-inguinal and anterior inter-axillary anastomosis and then focused on area of swelling near Rt superiolateral breast near incision.                         [facility] Goals - 08/29/16 1151      CC Long Term Goal  #1   Title Pt will be independent in self manual lymph drainage and use of compression to swollen area at right axilla    Baseline Pt now has 2 compression bras and has been wearing these and also wears them with the chip pack-08/29/16   Status Achieved     CC Long Term Goal  #2   Title Pt will be knowledgeable of lymphedema risk reduction or be signed up for ABC class    Baseline Pt was instructed in this today and handout issued-08/29/16   Status Achieved     CC Long Term Goal  #3   Title Pt will report she sees a 50% reduction in the swelling at anterior axilla    Baseline 90% improvement reported at this time-08/29/16   Status Achieved            Plan - 08/29/16 1218    Clinical Impression Statement Pts circumference of her bil UE's do not show pt has lymphedema at this time when comparing Rt to Lt. Did instruct her today in lymphedema risk reduction practices and she was very attentive to this asking questions throughout. She reports 90% improvement of area of swelling meeting that goal and has met all other goals at this time. Pt is ready to D/C.    Rehab Potential Excellent   Clinical Impairments Affecting Rehab Potential significant scoliosis, past chemo, 4 nodes removed    PT Frequency 2x / week  4 sessions over 4 weeks   PT Duration 4 weeks   PT Treatment/Interventions ADLs/Self Care Home Management;Patient/family education;Taping;Manual techniques;Manual lymph  drainage;DME Instruction;Therapeutic exercise   PT Next Visit Plan D/C this visit   Consulted and Agree with Plan of Care Patient      Patient will benefit from skilled therapeutic intervention in order to improve the following deficits and impairments:  Increased edema, Postural dysfunction, Decreased  knowledge of use of DME, Decreased knowledge of precautions  Visit Diagnosis: Abnormal posture  Aftercare following surgery for neoplasm  Localized swelling, mass and lump, trunk     Problem List Patient Active Problem List   Diagnosis Date Noted  . Bronchitis 07/26/2016  . Malignant neoplasm of upper-outer quadrant of right breast in female, estrogen receptor positive ([place]) 03/01/2016  . Malignant neoplasm of upper-outer quadrant of right female breast ([place]) 02/25/2016    Otelia Limes, PTA 08/29/2016, 12:22 PM   PHYSICAL THERAPY DISCHARGE SUMMARY  Visits from Start of Care: 4  Current functional level related to goals / functional outcomes: As above pt feels her goals have been met    Remaining deficits: As above    Education / Equipment: Lymphedema risk reduction, manua lymph drainage  Plan: Patient agrees to discharge.  Patient goals were met. Patient is being discharged due to meeting the stated rehab goals.  ?????    Donato Heinz. [address], [place], [place], [address] Phone: [phone]   Fax:  [phone]  Name: Brenda Harding MRN: [id number] Date of Birth: [birth year]

## 2016-08-31 ENCOUNTER — Encounter: Payer: Medicare FFS | Admitting: Physical Therapy

## 2016-08-31 ENCOUNTER — Ambulatory Visit
Admission: RE | Admit: 2016-08-31 | Discharge: 2016-08-31 | Disposition: A | Discharge status: Home / Self Care | Payer: Medicare FFS | Source: Ambulatory Visit | Attending: Radiation Oncology | Admitting: Radiation Oncology

## 2016-08-31 DIAGNOSIS — Z510 Encounter for antineoplastic radiation therapy: Secondary | ICD-10-CM | POA: Diagnosis not present

## 2016-08-31 DIAGNOSIS — C50411 Malignant neoplasm of upper-outer quadrant of right female breast: Secondary | ICD-10-CM

## 2016-08-31 DIAGNOSIS — Z170 Estrogen receptor positive status [ER+]: Secondary | ICD-10-CM

## 2016-08-31 NOTE — Progress Notes (Signed)
  Radiation Oncology         [phone]) [phone] ________________________________  Name: Brenda Harding MRN: [id number]  Date: 08/31/2016  DOB: [birth year]  Optical Surface Tracking Plan:  Since intensity modulated radiotherapy (IMRT) and 3D conformal radiation treatment methods are predicated on accurate and precise positioning for treatment, intrafraction motion monitoring is medically necessary to ensure accurate and safe treatment delivery.  The ability to quantify intrafraction motion without excessive ionizing radiation dose can only be performed with optical surface tracking. Accordingly, surface imaging offers the opportunity to obtain 3D measurements of patient position throughout IMRT and 3D treatments without excessive radiation exposure.  I am ordering optical surface tracking for this patient's upcoming course of radiotherapy. ________________________________  Kyung Rudd, MD 08/31/2016 12:30 PM    Reference:   Ursula Alert, J, et al. Surface imaging-based analysis of intrafraction motion for breast radiotherapy patients.Journal of [place], n. 6, nov. 2014. ISSN [id number].   Available at: <http://www.jacmp.org/index.php/jacmp/article/view/4957>.

## 2016-08-31 NOTE — Progress Notes (Signed)
  Radiation Oncology         [phone]) [phone] ________________________________  Name: Brenda Harding MRN: [id number]  Date: 08/31/2016  DOB: 18-Dec-1955   DIAGNOSIS:     ICD-9-CM ICD-10-CM   1. Malignant neoplasm of upper-outer quadrant of right breast in female, estrogen receptor positive ([place]) 174.4 C50.411    V86.0 Z17.0     SIMULATION AND TREATMENT PLANNING NOTE  The patient presented for simulation prior to beginning her course of radiation treatment for her diagnosis of right-sided breast cancer. The patient was placed in a supine position on a breast board. A customized vac-lock bag was constructed and this complex treatment device will be used on a daily basis during her treatment. In this fashion, a CT scan was obtained through the chest area and an isocenter was placed near the chest wall within the breast.  The patient will be planned to receive a course of radiation initially to a dose of 42.5 Gy. This will consist of a whole breast radiotherapy technique. To accomplish this, 2 customized blocks have been designed which will correspond to medial and lateral whole breast tangent fields. This treatment will be accomplished at 2.66 Gy per fraction. A forward planning technique will also be evaluated to determine if this approach improves the plan. It is anticipated that the patient will then receive a 10 Gy boost to the seroma cavity which has been contoured. This will be accomplished at 2.5 Gy per fraction.   This initial treatment will consist of a 3-D conformal technique. The seroma has been contoured as the primary target structure. Additionally, dose volume histograms of both this target as well as the lungs and heart will also be evaluated. Such an approach is necessary to ensure that the target area is adequately covered while the nearby critical  normal structures are adequately spared.  Plan:  The final anticipated total dose therefore will correspond to 52.5  Gy.    _______________________________   Jodelle Gross, MD, PhD

## 2016-09-04 DIAGNOSIS — Z510 Encounter for antineoplastic radiation therapy: Secondary | ICD-10-CM | POA: Diagnosis not present

## 2016-09-05 ENCOUNTER — Other Ambulatory Visit: Payer: Self-pay

## 2016-09-05 DIAGNOSIS — C50411 Malignant neoplasm of upper-outer quadrant of right female breast: Secondary | ICD-10-CM

## 2016-09-05 DIAGNOSIS — Z170 Estrogen receptor positive status [ER+]: Secondary | ICD-10-CM

## 2016-09-06 ENCOUNTER — Other Ambulatory Visit (HOSPITAL_BASED_OUTPATIENT_CLINIC_OR_DEPARTMENT_OTHER): Payer: Medicare FFS

## 2016-09-06 ENCOUNTER — Ambulatory Visit (HOSPITAL_BASED_OUTPATIENT_CLINIC_OR_DEPARTMENT_OTHER): Payer: Medicare FFS

## 2016-09-06 ENCOUNTER — Ambulatory Visit: Payer: Medicare FFS

## 2016-09-06 DIAGNOSIS — C50411 Malignant neoplasm of upper-outer quadrant of right female breast: Secondary | ICD-10-CM

## 2016-09-06 DIAGNOSIS — Z5112 Encounter for antineoplastic immunotherapy: Secondary | ICD-10-CM

## 2016-09-06 DIAGNOSIS — Z170 Estrogen receptor positive status [ER+]: Secondary | ICD-10-CM

## 2016-09-06 DIAGNOSIS — Z95828 Presence of other vascular implants and grafts: Secondary | ICD-10-CM

## 2016-09-06 LAB — CBC WITH DIFFERENTIAL/PLATELET
BASO%: 0.4 % (ref 0.0–2.0)
Basophils Absolute: 0 10*3/uL (ref 0.0–0.1)
EOS%: 0.7 % (ref 0.0–7.0)
Eosinophils Absolute: 0.1 10*3/uL (ref 0.0–0.5)
HCT: 33.7 % — ABNORMAL LOW (ref 34.8–46.6)
HGB: 11.5 g/dL — ABNORMAL LOW (ref 11.6–15.9)
LYMPH%: 17.9 % (ref 14.0–49.7)
MCH: 34.9 pg — ABNORMAL HIGH (ref 25.1–34.0)
MCHC: 34.2 g/dL (ref 31.5–36.0)
MCV: 102.2 fL — ABNORMAL HIGH (ref 79.5–101.0)
MONO#: 0.6 10*3/uL (ref 0.1–0.9)
MONO%: 8.5 % (ref 0.0–14.0)
NEUT#: 5.1 10*3/uL (ref 1.5–6.5)
NEUT%: 72.5 % (ref 38.4–76.8)
Platelets: 222 10*3/uL (ref 145–400)
RBC: 3.3 10*6/uL — ABNORMAL LOW (ref 3.70–5.45)
RDW: 12.8 % (ref 11.2–14.5)
WBC: 7 10*3/uL (ref 3.9–10.3)
lymph#: 1.3 10*3/uL (ref 0.9–3.3)

## 2016-09-06 LAB — COMPREHENSIVE METABOLIC PANEL
ALT: 9 U/L (ref 0–55)
AST: 11 U/L (ref 5–34)
Albumin: 4 g/dL (ref 3.5–5.0)
Alkaline Phosphatase: 149 U/L (ref 40–150)
Anion Gap: 9 mEq/L (ref 3–11)
BUN: 20.1 mg/dL (ref 7.0–26.0)
CO2: 26 mEq/L (ref 22–29)
Calcium: 9.9 mg/dL (ref 8.4–10.4)
Chloride: 105 mEq/L (ref 98–109)
Creatinine: 1.3 mg/dL — ABNORMAL HIGH (ref 0.6–1.1)
EGFR: 46 mL/min/{1.73_m2} — ABNORMAL LOW (ref >90)
Glucose: 86 mg/dl (ref 70–140)
Potassium: 4.1 mEq/L (ref 3.5–5.1)
Sodium: 141 mEq/L (ref 136–145)
Total Bilirubin: 0.7 mg/dL (ref 0.20–1.20)
Total Protein: 7.4 g/dL (ref 6.4–8.3)

## 2016-09-06 MED ORDER — DIPHENHYDRAMINE HCL 25 MG PO CAPS
ORAL_CAPSULE | ORAL | Status: AC
  Filled 2016-09-06: qty 2

## 2016-09-06 MED ORDER — ACETAMINOPHEN 325 MG PO TABS
ORAL_TABLET | ORAL | Status: AC
  Filled 2016-09-06: qty 2

## 2016-09-06 MED ORDER — ACETAMINOPHEN 500 MG PO TABS
ORAL_TABLET | ORAL | Status: AC
Start: 2016-09-06 — End: 2016-09-06
  Filled 2016-09-06: qty 2

## 2016-09-06 MED ORDER — HEPARIN SOD (PORK) LOCK FLUSH 100 UNIT/ML IV SOLN
500.0000 [IU] | Freq: Once | INTRAVENOUS | Status: AC | PRN
  Administered 2016-09-06: 500 [IU]
  Filled 2016-09-06: qty 5

## 2016-09-06 MED ORDER — SODIUM CHLORIDE 0.9% FLUSH
10.0000 mL | INTRAVENOUS | Status: DC | PRN
  Administered 2016-09-06: 10 mL via INTRAVENOUS
  Filled 2016-09-06: qty 10

## 2016-09-06 MED ORDER — SODIUM CHLORIDE 0.9 % IV SOLN
Freq: Once | INTRAVENOUS | Status: AC
  Administered 2016-09-06: 11:00:00 via INTRAVENOUS

## 2016-09-06 MED ORDER — TRASTUZUMAB CHEMO 150 MG IV SOLR
450.0000 mg | Freq: Once | INTRAVENOUS | Status: AC
  Administered 2016-09-06: 450 mg via INTRAVENOUS
  Filled 2016-09-06: qty 21.43

## 2016-09-06 MED ORDER — DIPHENHYDRAMINE HCL 25 MG PO CAPS
50.0000 mg | ORAL_CAPSULE | Freq: Once | ORAL | Status: AC
  Administered 2016-09-06: 50 mg via ORAL

## 2016-09-06 MED ORDER — SODIUM CHLORIDE 0.9% FLUSH
10.0000 mL | INTRAVENOUS | Status: DC | PRN
  Administered 2016-09-06: 10 mL
  Filled 2016-09-06: qty 10

## 2016-09-06 MED ORDER — ACETAMINOPHEN 325 MG PO TABS
650.0000 mg | ORAL_TABLET | Freq: Once | ORAL | Status: AC
  Administered 2016-09-06: 650 mg via ORAL

## 2016-09-06 MED ORDER — SODIUM CHLORIDE 0.9 % IV SOLN
420.0000 mg | Freq: Once | INTRAVENOUS | Status: AC
  Administered 2016-09-06: 420 mg via INTRAVENOUS
  Filled 2016-09-06: qty 14

## 2016-09-06 NOTE — Patient Instructions (Signed)
Implanted Port Home Guide An implanted port is a type of central line that is placed under the skin. Central lines are used to provide IV access when treatment or nutrition needs to be given through a person's veins. Implanted ports are used for long-term IV access. An implanted port may be placed because:  You need IV medicine that would be irritating to the small veins in your hands or arms.  You need long-term IV medicines, such as antibiotics.  You need IV nutrition for a long period.  You need frequent blood draws for lab tests.  You need dialysis.  Implanted ports are usually placed in the chest area, but they can also be placed in the upper arm, the abdomen, or the leg. An implanted port has two main parts:  Reservoir. The reservoir is round and will appear as a small, raised area under your skin. The reservoir is the part where a needle is inserted to give medicines or draw blood.  Catheter. The catheter is a thin, flexible tube that extends from the reservoir. The catheter is placed into a large vein. Medicine that is inserted into the reservoir goes into the catheter and then into the vein.  How will I care for my incision site? Do not get the incision site wet. Bathe or shower as directed by your health care provider. How is my port accessed? Special steps must be taken to access the port:  Before the port is accessed, a numbing cream can be placed on the skin. This helps numb the skin over the port site.  Your health care provider uses a sterile technique to access the port. ? Your health care provider must put on a mask and sterile gloves. ? The skin over your port is cleaned carefully with an antiseptic and allowed to dry. ? The port is gently pinched between sterile gloves, and a needle is inserted into the port.  Only "non-coring" port needles should be used to access the port. Once the port is accessed, a blood return should be checked. This helps ensure that the port  is in the vein and is not clogged.  If your port needs to remain accessed for a constant infusion, a clear (transparent) bandage will be placed over the needle site. The bandage and needle will need to be changed every week, or as directed by your health care provider.  Keep the bandage covering the needle clean and dry. Do not get it wet. Follow your health care provider's instructions on how to take a shower or bath while the port is accessed.  If your port does not need to stay accessed, no bandage is needed over the port.  What is flushing? Flushing helps keep the port from getting clogged. Follow your health care provider's instructions on how and when to flush the port. Ports are usually flushed with saline solution or a medicine called heparin. The need for flushing will depend on how the port is used.  If the port is used for intermittent medicines or blood draws, the port will need to be flushed: ? After medicines have been given. ? After blood has been drawn. ? As part of routine maintenance.  If a constant infusion is running, the port may not need to be flushed.  How long will my port stay implanted? The port can stay in for as long as your health care provider thinks it is needed. When it is time for the port to come out, surgery will be   done to remove it. The procedure is similar to the one performed when the port was put in. When should I seek immediate medical care? When you have an implanted port, you should seek immediate medical care if:  You notice a bad smell coming from the incision site.  You have swelling, redness, or drainage at the incision site.  You have more swelling or pain at the port site or the surrounding area.  You have a fever that is not controlled with medicine.  This information is not intended to replace advice given to you by your health care provider. Make sure you discuss any questions you have with your health care provider. Document  Released: 04/17/2005 Document Revised: 09/23/2015 Document Reviewed: 12/23/2012 Elsevier Interactive Patient Education  2017 [employer]vier Inc.  

## 2016-09-06 NOTE — Patient Instructions (Signed)
[facility] Discharge Instructions for Patients Receiving Chemotherapy  Today you received the following chemotherapy agents Herceptin/Perjeta To help prevent nausea and vomiting after your treatment, we encourage you to take your nausea medication as prescribed.   If you develop nausea and vomiting that is not controlled by your nausea medication, call the clinic.   BELOW ARE SYMPTOMS THAT SHOULD BE REPORTED IMMEDIATELY:  *FEVER GREATER THAN 100.5 F  *CHILLS WITH OR WITHOUT FEVER  NAUSEA AND VOMITING THAT IS NOT CONTROLLED WITH YOUR NAUSEA MEDICATION  *UNUSUAL SHORTNESS OF BREATH  *UNUSUAL BRUISING OR BLEEDING  TENDERNESS IN MOUTH AND THROAT WITH OR WITHOUT PRESENCE OF ULCERS  *URINARY PROBLEMS  *BOWEL PROBLEMS  UNUSUAL RASH Items with * indicate a potential emergency and should be followed up as soon as possible.  Feel free to call the clinic you have any questions or concerns. The clinic phone number is [phone].  Please show the CHEMO ALERT CARD at check-in to the Emergency Department and triage nurse.  

## 2016-09-08 ENCOUNTER — Ambulatory Visit
Admission: RE | Admit: 2016-09-08 | Discharge: 2016-09-08 | Disposition: A | Discharge status: Home / Self Care | Payer: Medicare FFS | Source: Ambulatory Visit | Attending: Radiation Oncology | Admitting: Radiation Oncology

## 2016-09-08 DIAGNOSIS — Z510 Encounter for antineoplastic radiation therapy: Secondary | ICD-10-CM | POA: Diagnosis not present

## 2016-09-11 ENCOUNTER — Ambulatory Visit
Admission: RE | Admit: 2016-09-11 | Discharge: 2016-09-11 | Disposition: A | Discharge status: Home / Self Care | Payer: Medicare FFS | Source: Ambulatory Visit | Attending: Radiation Oncology | Admitting: Radiation Oncology

## 2016-09-11 DIAGNOSIS — C50411 Malignant neoplasm of upper-outer quadrant of right female breast: Secondary | ICD-10-CM

## 2016-09-11 DIAGNOSIS — Z510 Encounter for antineoplastic radiation therapy: Secondary | ICD-10-CM | POA: Diagnosis not present

## 2016-09-11 MED ORDER — RADIAPLEXRX EX GEL
Freq: Once | CUTANEOUS | Status: AC
  Administered 2016-09-11: 15:00:00 via TOPICAL

## 2016-09-11 MED ORDER — ALRA NON-METALLIC DEODORANT (RAD-ONC)
1.0000 "application " | Freq: Once | TOPICAL | Status: AC
  Administered 2016-09-11: 1 via TOPICAL

## 2016-09-11 NOTE — Progress Notes (Signed)
Pt education done, radiation therapy and you book, my business card, radiaplex cream gel, alra deodorant given, discussed skin irritation, swelling of breast,tenderness, shooting pains,  Fatigue,pain, increase protein in diet, drink plenty fluids, get enough rest,exercise, lukewarm showers/baths, dove soap unscented preferred, pat dry, electric shaver only, , no under wire bras, will see MD weekly and prn, teach back given, do not use skin products 4 hours before rad txs

## 2016-09-12 ENCOUNTER — Ambulatory Visit
Admission: RE | Admit: 2016-09-12 | Discharge: 2016-09-12 | Disposition: A | Discharge status: Home / Self Care | Payer: Medicare FFS | Source: Ambulatory Visit | Attending: Radiation Oncology | Admitting: Radiation Oncology

## 2016-09-12 DIAGNOSIS — Z510 Encounter for antineoplastic radiation therapy: Secondary | ICD-10-CM | POA: Diagnosis not present

## 2016-09-13 ENCOUNTER — Ambulatory Visit
Admission: RE | Admit: 2016-09-13 | Discharge: 2016-09-13 | Disposition: A | Discharge status: Home / Self Care | Payer: Medicare FFS | Source: Ambulatory Visit | Attending: Radiation Oncology | Admitting: Radiation Oncology

## 2016-09-13 DIAGNOSIS — Z510 Encounter for antineoplastic radiation therapy: Secondary | ICD-10-CM | POA: Diagnosis not present

## 2016-09-14 ENCOUNTER — Ambulatory Visit
Admission: RE | Admit: 2016-09-14 | Discharge: 2016-09-14 | Disposition: A | Discharge status: Home / Self Care | Payer: Medicare FFS | Source: Ambulatory Visit | Attending: Radiation Oncology | Admitting: Radiation Oncology

## 2016-09-14 ENCOUNTER — Ambulatory Visit: Payer: Medicare FFS

## 2016-09-14 DIAGNOSIS — Z510 Encounter for antineoplastic radiation therapy: Secondary | ICD-10-CM | POA: Diagnosis not present

## 2016-09-15 ENCOUNTER — Ambulatory Visit
Admission: RE | Admit: 2016-09-15 | Discharge: 2016-09-15 | Disposition: A | Discharge status: Home / Self Care | Payer: Medicare FFS | Source: Ambulatory Visit | Attending: Radiation Oncology | Admitting: Radiation Oncology

## 2016-09-15 DIAGNOSIS — Z510 Encounter for antineoplastic radiation therapy: Secondary | ICD-10-CM | POA: Diagnosis not present

## 2016-09-18 ENCOUNTER — Ambulatory Visit
Admission: RE | Admit: 2016-09-18 | Discharge: 2016-09-18 | Disposition: A | Discharge status: Home / Self Care | Payer: Medicare FFS | Source: Ambulatory Visit | Attending: Radiation Oncology | Admitting: Radiation Oncology

## 2016-09-18 DIAGNOSIS — Z510 Encounter for antineoplastic radiation therapy: Secondary | ICD-10-CM | POA: Diagnosis not present

## 2016-09-19 ENCOUNTER — Ambulatory Visit
Admission: RE | Admit: 2016-09-19 | Discharge: 2016-09-19 | Disposition: A | Discharge status: Home / Self Care | Payer: Medicare FFS | Source: Ambulatory Visit | Attending: Radiation Oncology | Admitting: Radiation Oncology

## 2016-09-19 DIAGNOSIS — Z510 Encounter for antineoplastic radiation therapy: Secondary | ICD-10-CM | POA: Diagnosis not present

## 2016-09-20 ENCOUNTER — Ambulatory Visit
Admission: RE | Admit: 2016-09-20 | Discharge: 2016-09-20 | Disposition: A | Discharge status: Home / Self Care | Payer: Medicare FFS | Source: Ambulatory Visit | Attending: Radiation Oncology | Admitting: Radiation Oncology

## 2016-09-20 DIAGNOSIS — Z510 Encounter for antineoplastic radiation therapy: Secondary | ICD-10-CM | POA: Diagnosis not present

## 2016-09-21 ENCOUNTER — Ambulatory Visit
Admission: RE | Admit: 2016-09-21 | Discharge: 2016-09-21 | Disposition: A | Discharge status: Home / Self Care | Payer: Medicare FFS | Source: Ambulatory Visit | Attending: Radiation Oncology | Admitting: Radiation Oncology

## 2016-09-21 DIAGNOSIS — Z510 Encounter for antineoplastic radiation therapy: Secondary | ICD-10-CM | POA: Diagnosis not present

## 2016-09-22 ENCOUNTER — Ambulatory Visit
Admission: RE | Admit: 2016-09-22 | Discharge: 2016-09-22 | Disposition: A | Discharge status: Home / Self Care | Payer: Medicare FFS | Source: Ambulatory Visit | Attending: Radiation Oncology | Admitting: Radiation Oncology

## 2016-09-22 DIAGNOSIS — C50411 Malignant neoplasm of upper-outer quadrant of right female breast: Secondary | ICD-10-CM

## 2016-09-22 DIAGNOSIS — Z510 Encounter for antineoplastic radiation therapy: Secondary | ICD-10-CM | POA: Diagnosis not present

## 2016-09-22 MED ORDER — RADIAPLEXRX EX GEL
Freq: Once | CUTANEOUS | Status: AC
  Administered 2016-09-22: 15:00:00 via TOPICAL

## 2016-09-26 ENCOUNTER — Ambulatory Visit
Admission: RE | Admit: 2016-09-26 | Discharge: 2016-09-26 | Disposition: A | Discharge status: Home / Self Care | Payer: Medicare FFS | Source: Ambulatory Visit | Attending: Radiation Oncology | Admitting: Radiation Oncology

## 2016-09-26 DIAGNOSIS — Z510 Encounter for antineoplastic radiation therapy: Secondary | ICD-10-CM | POA: Diagnosis not present

## 2016-09-26 NOTE — Assessment & Plan Note (Signed)
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  Recommendationbased on multidisciplinary tumor board: 1. Neoadjuvant chemotherapy with TCH Perjeta 6 cycles followed by Herceptinand Perjetamaintenance for 1 year 03/22/2016- 07/05/2017 2. Breast conserving surgery if possible with sentinel lymph node study: 08/08/2016 3. Followed by adjuvant radiation therapy started 09/11/16 4. 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

## 2016-09-27 ENCOUNTER — Ambulatory Visit: Payer: Medicare FFS

## 2016-09-27 ENCOUNTER — Encounter: Payer: Self-pay | Admitting: Hematology and Oncology

## 2016-09-27 ENCOUNTER — Ambulatory Visit
Admission: RE | Admit: 2016-09-27 | Discharge: 2016-09-27 | Disposition: A | Discharge status: Home / Self Care | Payer: Medicare FFS | Source: Ambulatory Visit | Attending: Radiation Oncology | Admitting: Radiation Oncology

## 2016-09-27 ENCOUNTER — Ambulatory Visit (HOSPITAL_BASED_OUTPATIENT_CLINIC_OR_DEPARTMENT_OTHER): Payer: Medicare FFS

## 2016-09-27 ENCOUNTER — Ambulatory Visit (HOSPITAL_BASED_OUTPATIENT_CLINIC_OR_DEPARTMENT_OTHER): Payer: Medicare FFS | Admitting: Hematology and Oncology

## 2016-09-27 ENCOUNTER — Other Ambulatory Visit (HOSPITAL_BASED_OUTPATIENT_CLINIC_OR_DEPARTMENT_OTHER): Payer: Medicare FFS

## 2016-09-27 DIAGNOSIS — Z5112 Encounter for antineoplastic immunotherapy: Secondary | ICD-10-CM

## 2016-09-27 DIAGNOSIS — C50411 Malignant neoplasm of upper-outer quadrant of right female breast: Secondary | ICD-10-CM

## 2016-09-27 DIAGNOSIS — Z95828 Presence of other vascular implants and grafts: Secondary | ICD-10-CM

## 2016-09-27 DIAGNOSIS — Z170 Estrogen receptor positive status [ER+]: Secondary | ICD-10-CM | POA: Diagnosis not present

## 2016-09-27 DIAGNOSIS — K769 Liver disease, unspecified: Secondary | ICD-10-CM | POA: Diagnosis not present

## 2016-09-27 DIAGNOSIS — Z510 Encounter for antineoplastic radiation therapy: Secondary | ICD-10-CM | POA: Diagnosis not present

## 2016-09-27 LAB — COMPREHENSIVE METABOLIC PANEL
ALT: 11 U/L (ref 0–55)
AST: 15 U/L (ref 5–34)
Albumin: 4.4 g/dL (ref 3.5–5.0)
Alkaline Phosphatase: 193 U/L — ABNORMAL HIGH (ref 40–150)
Anion Gap: 10 mEq/L (ref 3–11)
BUN: 18.1 mg/dL (ref 7.0–26.0)
CO2: 26 mEq/L (ref 22–29)
Calcium: 9.8 mg/dL (ref 8.4–10.4)
Chloride: 104 mEq/L (ref 98–109)
Creatinine: 1.2 mg/dL — ABNORMAL HIGH (ref 0.6–1.1)
EGFR: 48 mL/min/{1.73_m2} — ABNORMAL LOW (ref >90)
Glucose: 104 mg/dl (ref 70–140)
Potassium: 4.3 mEq/L (ref 3.5–5.1)
Sodium: 140 mEq/L (ref 136–145)
Total Bilirubin: 0.56 mg/dL (ref 0.20–1.20)
Total Protein: 7.6 g/dL (ref 6.4–8.3)

## 2016-09-27 LAB — CBC WITH DIFFERENTIAL/PLATELET
BASO%: 1 % (ref 0.0–2.0)
Basophils Absolute: 0 10*3/uL (ref 0.0–0.1)
EOS%: 1.6 % (ref 0.0–7.0)
Eosinophils Absolute: 0.1 10*3/uL (ref 0.0–0.5)
HCT: 39.7 % (ref 34.8–46.6)
HGB: 13.2 g/dL (ref 11.6–15.9)
LYMPH%: 26.4 % (ref 14.0–49.7)
MCH: 33.4 pg (ref 25.1–34.0)
MCHC: 33.3 g/dL (ref 31.5–36.0)
MCV: 100.2 fL (ref 79.5–101.0)
MONO#: 0.4 10*3/uL (ref 0.1–0.9)
MONO%: 9.5 % (ref 0.0–14.0)
NEUT#: 2.6 10*3/uL (ref 1.5–6.5)
NEUT%: 61.5 % (ref 38.4–76.8)
Platelets: 210 10*3/uL (ref 145–400)
RBC: 3.96 10*6/uL (ref 3.70–5.45)
RDW: 13.3 % (ref 11.2–14.5)
WBC: 4.1 10*3/uL (ref 3.9–10.3)
lymph#: 1.1 10*3/uL (ref 0.9–3.3)

## 2016-09-27 MED ORDER — ALPRAZOLAM 0.5 MG PO TABS: 0.5000 mg | ORAL_TABLET | Freq: Two times a day (BID) | ORAL | 0 refills | Status: DC | PRN

## 2016-09-27 MED ORDER — DIPHENHYDRAMINE HCL 25 MG PO CAPS
ORAL_CAPSULE | ORAL | Status: AC
  Filled 2016-09-27: qty 2

## 2016-09-27 MED ORDER — HEPARIN SOD (PORK) LOCK FLUSH 100 UNIT/ML IV SOLN
500.0000 [IU] | Freq: Once | INTRAVENOUS | Status: AC | PRN
  Administered 2016-09-27: 500 [IU]
  Filled 2016-09-27: qty 5

## 2016-09-27 MED ORDER — SODIUM CHLORIDE 0.9% FLUSH
10.0000 mL | INTRAVENOUS | Status: AC | PRN
  Administered 2016-09-27: 10 mL via INTRAVENOUS
  Filled 2016-09-27: qty 10

## 2016-09-27 MED ORDER — DIPHENOXYLATE-ATROPINE 2.5-0.025 MG PO TABS: 1.0000 | ORAL_TABLET | Freq: Four times a day (QID) | ORAL | 0 refills | Status: DC | PRN

## 2016-09-27 MED ORDER — TRASTUZUMAB CHEMO 150 MG IV SOLR
450.0000 mg | Freq: Once | INTRAVENOUS | Status: AC
  Administered 2016-09-27: 450 mg via INTRAVENOUS
  Filled 2016-09-27: qty 21.43

## 2016-09-27 MED ORDER — ACETAMINOPHEN 325 MG PO TABS
650.0000 mg | ORAL_TABLET | Freq: Once | ORAL | Status: AC
  Administered 2016-09-27: 650 mg via ORAL

## 2016-09-27 MED ORDER — SODIUM CHLORIDE 0.9 % IV SOLN
420.0000 mg | Freq: Once | INTRAVENOUS | Status: AC
  Administered 2016-09-27: 420 mg via INTRAVENOUS
  Filled 2016-09-27: qty 14

## 2016-09-27 MED ORDER — DIPHENHYDRAMINE HCL 25 MG PO CAPS
50.0000 mg | ORAL_CAPSULE | Freq: Once | ORAL | Status: AC
  Administered 2016-09-27: 50 mg via ORAL

## 2016-09-27 MED ORDER — SODIUM CHLORIDE 0.9% FLUSH
10.0000 mL | INTRAVENOUS | Status: DC | PRN
  Administered 2016-09-27: 10 mL
  Filled 2016-09-27: qty 10

## 2016-09-27 MED ORDER — ACETAMINOPHEN 325 MG PO TABS
ORAL_TABLET | ORAL | Status: AC
  Filled 2016-09-27: qty 2

## 2016-09-27 MED ORDER — SODIUM CHLORIDE 0.9 % IV SOLN
Freq: Once | INTRAVENOUS | Status: AC
  Administered 2016-09-27: 15:00:00 via INTRAVENOUS

## 2016-09-27 NOTE — Progress Notes (Signed)
Patient Care Team: Tobe Sos, MD as PCP - General (Internal Medicine) Maisie Fus, MD as Consulting Physician (Obstetrics and Gynecology) [name], MD as Consulting Physician (General Surgery) Nicholas Lose, MD as Consulting Physician (Hematology and Oncology) Kyung Rudd, MD as Consulting Physician (Radiation Oncology)  DIAGNOSIS:  Encounter Diagnosis  Name Primary?  . Malignant neoplasm of upper-outer quadrant of right breast in female, estrogen receptor positive ([place])     SUMMARY OF ONCOLOGIC HISTORY:   Malignant neoplasm of upper-outer quadrant of right female breast ([place])   02/24/2016 Initial Diagnosis    Right breast biopsy 1:00, grade 2-3 IDC, ER 5%, PR 0%, Ki-67 90%, HER-2 positive ratio 7.28, right breast biopsy 1:00 1.5 cm medial to the dominant mass fibrocystic changes; ultrasound and mammogram 02/23/2016: 2.5 cm mass at 1:00 position and a small 69m irregular mass 1.5 cm medial to the larger mass, T2 N0 stage II a clinical stage      03/04/2016 Breast MRI    2.9 cm irregular enhancing mass located within the right breast at 1:00 position, several T2 bright lesions in the right lobe of the liver incompletely visualized could be hepatic cysts      03/22/2016 - 07/05/2016 Neo-Adjuvant Chemotherapy    Neo-Adj TCHP 5 cycles followed by Herceptin and Perjeta      07/07/2016 Breast MRI    Right breast cancer: No measurable enhancement, residual 1-2 mm foci; left breast partially cystic lesion at 2:30 position 2.1 cm which is new 1 indeterminate left axillary lymph node      08/09/2016 Surgery    Right lumpectomy: 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, no residual invasive cancer, 0/4 lymph nodes negative, ER 5% week, PR negative, HER-2 positive, Ki-67 90%: Complete pathologic response ypTis ypN0      09/11/2016 -  Radiation Therapy    Adj XRT       CHIEF COMPLIANT: Herceptin and Perjeta maintenance  INTERVAL HISTORY: AEDLYN ROSENBURGis a 61year old with  above-mentioned history of right breast cancer treated with neoadjuvant chemotherapy followed by lumpectomy and she is currently on radiation. She is also receiving Herceptin and Perjeta maintenance. She is tolerating this treatment extremely well. She does have diarrhea for a few days after the treatment and she takes Lomotil which appears to be helping her. Her hair is coming up and she is very happy about that.  REVIEW OF SYSTEMS:   Constitutional: Denies fevers, chills or abnormal weight loss Eyes: Denies blurriness of vision Ears, nose, mouth, throat, and face: Denies mucositis or sore throat Respiratory: Denies cough, dyspnea or wheezes Cardiovascular: Denies palpitation, chest discomfort Gastrointestinal:  Denies nausea, heartburn or change in bowel habits Skin: Denies abnormal skin rashes Lymphatics: Denies new lymphadenopathy or easy bruising Neurological:Denies numbness, tingling or new weaknesses Behavioral/Psych: Mood is stable, no new changes  Extremities: No lower extremity edema Breast:  denies any pain or lumps or nodules in either breasts All other systems were reviewed with the patient and are negative.  I have reviewed the past medical history, past surgical history, social history and family history with the patient and they are unchanged from previous note.  ALLERGIES:  is allergic to propoxyphene; inapsine [droperidol]; latex; and levaquin [levofloxacin].  MEDICATIONS:  Current Outpatient Prescriptions  Medication Sig Dispense Refill  . albuterol (PROVENTIL HFA;VENTOLIN HFA) 108 (90 Base) MCG/ACT inhaler Inhale 2 puffs into the lungs 2 (two) times daily as needed for wheezing or shortness of breath.     . ALPRAZolam (XANAX) 0.5 MG  tablet Take 1 tablet (0.5 mg total) by mouth 2 (two) times daily as needed. (Patient taking differently: Take 0.5 mg by mouth 2 (two) times daily as needed (for anxiety). ) 60 tablet 2  . betamethasone dipropionate (DIPROLENE) 0.05 % cream  Apply 1 application topically every Monday, Wednesday, and Friday. Applied to vagina    . Biotin 10000 MCG TABS Take by mouth.    . chlorpheniramine-HYDROcodone (TUSSIONEX PENNKINETIC ER) 10-8 MG/5ML SUER Take 5 mLs by mouth at bedtime as needed for cough.    . Cholecalciferol (VITAMIN D3 PO) Take 2,500 Units by mouth 2 (two) times a week.     Marland Kitchen dexamethasone (DECADRON) 2 MG tablet     . diphenoxylate-atropine (LOMOTIL) 2.5-0.025 MG tablet Take 1 tablet by mouth 4 (four) times daily as needed for diarrhea or loose stools. (Patient not taking: Reported on 08/21/2016) 30 tablet 0  . fluconazole (DIFLUCAN) 150 MG tablet     . GARLIC PO Take 4,332 mg by mouth daily.     Marland Kitchen guaiFENesin (MUCINEX) 600 MG 12 hr tablet Take 600 mg by mouth 2 (two) times daily as needed for cough or to loosen phlegm.     . hyaluronate sodium (RADIAPLEXRX) GEL Apply 1 application topically 2 (two) times daily.    . hydrOXYzine (ATARAX/VISTARIL) 25 MG tablet Take 1 tablet (25 mg total) by mouth 3 (three) times daily as needed. (Patient not taking: Reported on 07/24/2016) 60 tablet 3  . ibuprofen (ADVIL,MOTRIN) 800 MG tablet 800 mg every 8 (eight) hours as needed (for pain.).     Marland Kitchen lansoprazole (PREVACID) 30 MG capsule Take 30 mg by mouth daily.     Marland Kitchen levocetirizine (XYZAL) 5 MG tablet Take 5 mg by mouth every evening.    Marland Kitchen LORazepam (ATIVAN) 0.5 MG tablet Take 1 tablet (0.5 mg total) by mouth at bedtime. (Patient not taking: Reported on 07/24/2016) 30 tablet 0  . magic mouthwash w/lidocaine SOLN Take 5 mLs by mouth 3 (three) times daily as needed for mouth pain. (Patient not taking: Reported on 06/12/2016) 150 mL 1  . montelukast (SINGULAIR) 10 MG tablet Take 10 mg by mouth daily.     . non-metallic deodorant Jethro Poling) MISC Apply 1 application topically daily as needed.    . ondansetron (ZOFRAN) 8 MG tablet Take 1 tablet (8 mg total) by mouth 2 (two) times daily as needed for refractory nausea / vomiting. Start on day 3 after chemo.  (Patient not taking: Reported on 08/21/2016) 30 tablet 1  . oxyCODONE (OXY IR/ROXICODONE) 5 MG immediate release tablet Take 1-2 tablets (5-10 mg total) by mouth every 6 (six) hours as needed for moderate pain, severe pain or breakthrough pain. (Patient not taking: Reported on 08/21/2016) 20 tablet 0  . PREMARIN vaginal cream Place 1 Applicatorful vaginally 2 (two) times a week. Tuesday & Saturday    . prochlorperazine (COMPAZINE) 10 MG tablet Take 1 tablet (10 mg total) by mouth every 6 (six) hours as needed (Nausea or vomiting). (Patient not taking: Reported on 08/21/2016) 30 tablet 1  . spironolactone (ALDACTONE) 50 MG tablet Take 50 mg by mouth 2 (two) times daily.     . Triamcinolone Acetonide (NASACORT ALLERGY 24HR NA) Place 2 sprays into the nose daily.     . verapamil (CALAN-SR) 240 MG CR tablet Take 240 mg by mouth daily.     No current facility-administered medications for this visit.    Facility-Administered Medications Ordered in Other Visits  Medication Dose Route Frequency Provider  Last Rate Last Dose  . sodium chloride flush (NS) 0.9 % injection 10 mL  10 mL Intravenous PRN Nicholas Lose, MD   10 mL at 09/27/16 1326    PHYSICAL EXAMINATION: ECOG PERFORMANCE STATUS: 1 - Symptomatic but completely ambulatory  Vitals:   09/27/16 1348  BP: (!) 147/90  Pulse: 84  Resp: 18  Temp: 98.4 F (36.9 C)   Filed Weights   09/27/16 1348  Weight: 154 lb 9.6 oz (70.1 kg)    GENERAL:alert, no distress and comfortable SKIN: skin color, texture, turgor are normal, no rashes or significant lesions EYES: normal, Conjunctiva are pink and non-injected, sclera clear OROPHARYNX:no exudate, no erythema and lips, buccal mucosa, and tongue normal  NECK: supple, thyroid normal size, non-tender, without nodularity LYMPH:  no palpable lymphadenopathy in the cervical, axillary or inguinal LUNGS: clear to auscultation and percussion with normal breathing effort HEART: regular rate & rhythm and no  murmurs and no lower extremity edema ABDOMEN:abdomen soft, non-tender and normal bowel sounds MUSCULOSKELETAL:no cyanosis of digits and no clubbing  NEURO: alert & oriented x 3 with fluent speech, no focal motor/sensory deficits EXTREMITIES: No lower extremity edema  LABORATORY DATA:  I have reviewed the data as listed   Chemistry      Component Value Date/Time   NA 141 09/06/2016 1006   K 4.1 09/06/2016 1006   CL 104 03/20/2016 0927   CO2 26 09/06/2016 1006   BUN 20.1 09/06/2016 1006   CREATININE 1.3 (H) 09/06/2016 1006      Component Value Date/Time   CALCIUM 9.9 09/06/2016 1006   ALKPHOS 149 09/06/2016 1006   AST 11 09/06/2016 1006   ALT 9 09/06/2016 1006   BILITOT 0.70 09/06/2016 1006       Lab Results  Component Value Date   WBC 4.1 09/27/2016   HGB 13.2 09/27/2016   HCT 39.7 09/27/2016   MCV 100.2 09/27/2016   PLT 210 09/27/2016   NEUTROABS 2.6 09/27/2016    ASSESSMENT & PLAN:  Malignant neoplasm of upper-outer quadrant of right female breast ([facility]) Right breast biopsy 1:00, grade 2-3 IDC, ER 5%, PR 0%, Ki-67 90%, HER-2 positive ratio 7.28, right breast biopsy 1:00 1.5 cm medial to the dominant mass fibrocystic changes; ultrasound and mammogram 02/23/2016: 2.5 cm mass at 1:00 position and a small 49m irregular mass 1.5 cm medial to the larger mass, T2 N0 stage II a clinical stage  Breast MRI 03/04/2016: 2.9 cm irregular enhancing mass located within the right breast at 1:00 position, several T2 bright lesions in the right lobe of the liver incompletely visualized could be hepatic cysts.  Recommendationbased on multidisciplinary tumor board: 1. Neoadjuvant chemotherapy with TCH Perjeta 6 cycles followed by Herceptinand Perjetamaintenance for 1 year 03/22/2016- 07/05/2017 2. Breast conserving surgery if possible with sentinel lymph node study: 08/08/2016 3. Followed by adjuvant radiation therapy started 09/11/16 4. Followed by antiestrogen therapy  Right  lumpectomy 08/08/2016: 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, no residual invasive cancer, 0/4 lymph nodes negative, ER 5% week, PR negative, HER-2 positive, Ki-67 90%: Complete pathologic response ypTis ypN0 ------------------------------------------------------------------------------- Current Treatment: Herceptin and Perjeta Maintenance No majot toxicities RTC in 6 weeks for follow up and every 3 weeks for Herceptin and Perjeta   I spent 25 minutes talking to the patient of which more than half was spent in counseling and coordination of care.  No orders of the defined types were placed in this encounter.  The patient has a good understanding of the overall plan. she  agrees with it. she will call with any problems that may develop before the next visit here.   Rulon Eisenmenger, MD 09/27/16

## 2016-09-27 NOTE — Patient Instructions (Signed)
Implanted Port Home Guide An implanted port is a type of central line that is placed under the skin. Central lines are used to provide IV access when treatment or nutrition needs to be given through a person's veins. Implanted ports are used for long-term IV access. An implanted port may be placed because:  You need IV medicine that would be irritating to the small veins in your hands or arms.  You need long-term IV medicines, such as antibiotics.  You need IV nutrition for a long period.  You need frequent blood draws for lab tests.  You need dialysis.  Implanted ports are usually placed in the chest area, but they can also be placed in the upper arm, the abdomen, or the leg. An implanted port has two main parts:  Reservoir. The reservoir is round and will appear as a small, raised area under your skin. The reservoir is the part where a needle is inserted to give medicines or draw blood.  Catheter. The catheter is a thin, flexible tube that extends from the reservoir. The catheter is placed into a large vein. Medicine that is inserted into the reservoir goes into the catheter and then into the vein.  How will I care for my incision site? Do not get the incision site wet. Bathe or shower as directed by your health care provider. How is my port accessed? Special steps must be taken to access the port:  Before the port is accessed, a numbing cream can be placed on the skin. This helps numb the skin over the port site.  Your health care provider uses a sterile technique to access the port. ? Your health care provider must put on a mask and sterile gloves. ? The skin over your port is cleaned carefully with an antiseptic and allowed to dry. ? The port is gently pinched between sterile gloves, and a needle is inserted into the port.  Only "non-coring" port needles should be used to access the port. Once the port is accessed, a blood return should be checked. This helps ensure that the port  is in the vein and is not clogged.  If your port needs to remain accessed for a constant infusion, a clear (transparent) bandage will be placed over the needle site. The bandage and needle will need to be changed every week, or as directed by your health care provider.  Keep the bandage covering the needle clean and dry. Do not get it wet. Follow your health care provider's instructions on how to take a shower or bath while the port is accessed.  If your port does not need to stay accessed, no bandage is needed over the port.  What is flushing? Flushing helps keep the port from getting clogged. Follow your health care provider's instructions on how and when to flush the port. Ports are usually flushed with saline solution or a medicine called heparin. The need for flushing will depend on how the port is used.  If the port is used for intermittent medicines or blood draws, the port will need to be flushed: ? After medicines have been given. ? After blood has been drawn. ? As part of routine maintenance.  If a constant infusion is running, the port may not need to be flushed.  How long will my port stay implanted? The port can stay in for as long as your health care provider thinks it is needed. When it is time for the port to come out, surgery will be   done to remove it. The procedure is similar to the one performed when the port was put in. When should I seek immediate medical care? When you have an implanted port, you should seek immediate medical care if:  You notice a bad smell coming from the incision site.  You have swelling, redness, or drainage at the incision site.  You have more swelling or pain at the port site or the surrounding area.  You have a fever that is not controlled with medicine.  This information is not intended to replace advice given to you by your health care provider. Make sure you discuss any questions you have with your health care provider. Document  Released: 04/17/2005 Document Revised: 09/23/2015 Document Reviewed: 12/23/2012 Elsevier Interactive Patient Education  2017 [employer]vier Inc.  

## 2016-09-28 ENCOUNTER — Ambulatory Visit
Admission: RE | Admit: 2016-09-28 | Discharge: 2016-09-28 | Disposition: A | Discharge status: Home / Self Care | Payer: Medicare FFS | Source: Ambulatory Visit | Attending: Radiation Oncology | Admitting: Radiation Oncology

## 2016-09-28 DIAGNOSIS — Z510 Encounter for antineoplastic radiation therapy: Secondary | ICD-10-CM | POA: Diagnosis not present

## 2016-09-29 ENCOUNTER — Ambulatory Visit: Payer: Medicare FFS | Admitting: Radiation Oncology

## 2016-09-29 ENCOUNTER — Ambulatory Visit
Admission: RE | Admit: 2016-09-29 | Discharge: 2016-09-29 | Disposition: A | Discharge status: Home / Self Care | Payer: Medicare FFS | Source: Ambulatory Visit | Attending: Radiation Oncology | Admitting: Radiation Oncology

## 2016-09-29 DIAGNOSIS — Z510 Encounter for antineoplastic radiation therapy: Secondary | ICD-10-CM | POA: Diagnosis not present

## 2016-10-02 ENCOUNTER — Encounter (HOSPITAL_COMMUNITY): Payer: Self-pay

## 2016-10-02 ENCOUNTER — Ambulatory Visit (HOSPITAL_COMMUNITY)
Admission: RE | Admit: 2016-10-02 | Discharge: 2016-10-02 | Disposition: A | Discharge status: Home / Self Care | Payer: Medicare FFS | Source: Ambulatory Visit | Attending: Cardiology | Admitting: Cardiology

## 2016-10-02 ENCOUNTER — Ambulatory Visit (HOSPITAL_BASED_OUTPATIENT_CLINIC_OR_DEPARTMENT_OTHER)
Admission: RE | Admit: 2016-10-02 | Discharge: 2016-10-02 | Disposition: A | Discharge status: Home / Self Care | Payer: Medicare FFS | Source: Ambulatory Visit | Attending: Cardiology | Admitting: Cardiology

## 2016-10-02 ENCOUNTER — Ambulatory Visit
Admission: RE | Admit: 2016-10-02 | Discharge: 2016-10-02 | Disposition: A | Discharge status: Home / Self Care | Payer: Medicare FFS | Source: Ambulatory Visit | Attending: Radiation Oncology | Admitting: Radiation Oncology

## 2016-10-02 VITALS — BP 137/79 | HR 75 | Wt 153.2 lb

## 2016-10-02 DIAGNOSIS — I10 Essential (primary) hypertension: Secondary | ICD-10-CM | POA: Diagnosis not present

## 2016-10-02 DIAGNOSIS — F419 Anxiety disorder, unspecified: Secondary | ICD-10-CM | POA: Diagnosis not present

## 2016-10-02 DIAGNOSIS — Z170 Estrogen receptor positive status [ER+]: Secondary | ICD-10-CM | POA: Insufficient documentation

## 2016-10-02 DIAGNOSIS — E785 Hyperlipidemia, unspecified: Secondary | ICD-10-CM | POA: Insufficient documentation

## 2016-10-02 DIAGNOSIS — C50411 Malignant neoplasm of upper-outer quadrant of right female breast: Secondary | ICD-10-CM

## 2016-10-02 DIAGNOSIS — I340 Nonrheumatic mitral (valve) insufficiency: Secondary | ICD-10-CM | POA: Diagnosis not present

## 2016-10-02 DIAGNOSIS — Z510 Encounter for antineoplastic radiation therapy: Secondary | ICD-10-CM | POA: Diagnosis not present

## 2016-10-02 NOTE — Progress Notes (Signed)
  Echocardiogram 2D Echocardiogram has been performed.  Donata Clay 10/02/2016, 2:15 PM

## 2016-10-02 NOTE — Patient Instructions (Signed)
We will contact you in 3 months to schedule your next appointment and echocardiogram  

## 2016-10-03 ENCOUNTER — Ambulatory Visit
Admission: RE | Admit: 2016-10-03 | Discharge: 2016-10-03 | Disposition: A | Discharge status: Home / Self Care | Payer: Medicare FFS | Source: Ambulatory Visit | Attending: Radiation Oncology | Admitting: Radiation Oncology

## 2016-10-03 DIAGNOSIS — Z510 Encounter for antineoplastic radiation therapy: Secondary | ICD-10-CM | POA: Diagnosis not present

## 2016-10-03 NOTE — Progress Notes (Signed)
Oncology: Dr. Lindi Adie  61 yo with history of HTN and breast cancer presents for cardio-oncology evaluation.  She has been generally in good health.  She has HTN controlled by verapamil CR and spironolactone.  No chest pain.  Mild dyspnea walking up a flight of steps.    Breast cancer was diagnosed by biopsy in 10/17.  ER+/PR-/HER2+.  Plan for [facility] Perjeta chemo x 6 cycles, then Herceptin alone to complete 1 year of treatment.  She had a lumpectomy in 4/18, now getting radiation.   PMH:  1. HTN 2. Hyperlipidemia: Diet-controlled.  3. Anxiety 4. Breast cancer:  Diagnosed 10/17, ER+/PR-/HER2+.  Plan for [facility] Perjeta chemo x 6 cycles, then Herceptin alone to complete 1 year of treatment. Lumpectomy 4/18.  - Echo (11/17): EF 60-65%, GLS -18.5%.  - Echo (3/18): EF 55-60%, GLS -20.8%, normal RV size and systolic function.  - Echo (6/18): EF 55-60%, GLS -19.5%  SH: Lives in [place], never smoked, rare ETOH.   FH: Sister with CABG at age 7.   ROS: All systems reviewed and negative except as per HPI.   Current Outpatient Prescriptions  Medication Sig Dispense Refill  . albuterol (PROVENTIL HFA;VENTOLIN HFA) 108 (90 Base) MCG/ACT inhaler Inhale 2 puffs into the lungs 2 (two) times daily as needed for wheezing or shortness of breath.     . ALPRAZolam (XANAX) 0.5 MG tablet Take 1 tablet (0.5 mg total) by mouth 2 (two) times daily as needed (for anxiety). 60 tablet 0  . betamethasone dipropionate (DIPROLENE) 0.05 % cream Apply 1 application topically every Monday, Wednesday, and Friday. Applied to vagina    . Biotin 10000 MCG TABS Take by mouth.    . chlorpheniramine-HYDROcodone (TUSSIONEX PENNKINETIC ER) 10-8 MG/5ML SUER Take 5 mLs by mouth at bedtime as needed for cough.    . Cholecalciferol (VITAMIN D3 PO) Take 2,500 Units by mouth 2 (two) times a week.     . diphenoxylate-atropine (LOMOTIL) 2.5-0.025 MG tablet Take 1 tablet by mouth 4 (four) times daily as needed for diarrhea or loose stools. 30  tablet 0  . GARLIC PO Take 5,009 mg by mouth daily.     Marland Kitchen guaiFENesin (MUCINEX) 600 MG 12 hr tablet Take 600 mg by mouth 2 (two) times daily as needed for cough or to loosen phlegm.     . hyaluronate sodium (RADIAPLEXRX) GEL Apply 1 application topically 2 (two) times daily.    Marland Kitchen ibuprofen (ADVIL,MOTRIN) 800 MG tablet 800 mg every 8 (eight) hours as needed (for pain.).     Marland Kitchen lansoprazole (PREVACID) 30 MG capsule Take 30 mg by mouth daily.     Marland Kitchen levocetirizine (XYZAL) 5 MG tablet Take 5 mg by mouth every evening.    . montelukast (SINGULAIR) 10 MG tablet Take 10 mg by mouth daily.     . non-metallic deodorant Jethro Poling) MISC Apply 1 application topically daily as needed.    Marland Kitchen PREMARIN vaginal cream Place 1 Applicatorful vaginally 2 (two) times a week. Tuesday & Saturday    . spironolactone (ALDACTONE) 50 MG tablet Take 50 mg by mouth 2 (two) times daily.     . Triamcinolone Acetonide (NASACORT ALLERGY 24HR NA) Place 2 sprays into the nose daily.     . verapamil (CALAN-SR) 240 MG CR tablet Take 240 mg by mouth daily.    . ondansetron (ZOFRAN) 8 MG tablet Take 1 tablet (8 mg total) by mouth 2 (two) times daily as needed for refractory nausea / vomiting. Start on day 3  after chemo. (Patient not taking: Reported on 10/02/2016) 30 tablet 1   No current facility-administered medications for this encounter.    Facility-Administered Medications Ordered in Other Encounters  Medication Dose Route Frequency Provider Last Rate Last Dose  . sodium chloride flush (NS) 0.9 % injection 10 mL  10 mL Intravenous PRN Nicholas Lose, MD   10 mL at 09/27/16 1326   BP 137/79   Pulse 75   Wt 153 lb 4 oz (69.5 kg)   SpO2 100%   BMI 25.50 kg/m  General: NAD Neck: JVP 7 cm, no thyromegaly or thyroid nodule.  Lungs: CTAB CV: Nondisplaced PMI.  Heart regular S1/S2, no S3/S4, no murmur.  No peripheral edema.  No carotid bruit.  Normal pedal pulses.  Abdomen: Soft, nontender, no hepatosplenomegaly, no distention.  Skin:  Intact without lesions or rashes.  Neurologic: Alert and oriented x 3.  Psych: Normal affect. Extremities: No clubbing or cyanosis.  HEENT: Normal.   Assessment/Plan:  1. HTN: BP controlled on current regimen.   2. Breast cancer: Patient is undergoing a chemotherapy regimen including Herceptin.  Herceptin will continue 1 year.  I reviewed today's echo: EF remains stable with normal strain pattern, no change from prior.  - Repeat echo in 3 months with office visit.   Loralie Champagne 10/03/2016

## 2016-10-04 ENCOUNTER — Ambulatory Visit: Payer: Medicare FFS

## 2016-10-04 ENCOUNTER — Ambulatory Visit
Admission: RE | Admit: 2016-10-04 | Discharge: 2016-10-04 | Disposition: A | Discharge status: Home / Self Care | Payer: Medicare FFS | Source: Ambulatory Visit | Attending: Radiation Oncology | Admitting: Radiation Oncology

## 2016-10-04 DIAGNOSIS — Z510 Encounter for antineoplastic radiation therapy: Secondary | ICD-10-CM | POA: Diagnosis not present

## 2016-10-05 ENCOUNTER — Ambulatory Visit
Admission: RE | Admit: 2016-10-05 | Discharge: 2016-10-05 | Disposition: A | Discharge status: Home / Self Care | Payer: Medicare FFS | Source: Ambulatory Visit | Attending: Radiation Oncology | Admitting: Radiation Oncology

## 2016-10-05 ENCOUNTER — Ambulatory Visit: Payer: Medicare FFS

## 2016-10-05 DIAGNOSIS — Z510 Encounter for antineoplastic radiation therapy: Secondary | ICD-10-CM | POA: Diagnosis not present

## 2016-10-06 ENCOUNTER — Ambulatory Visit: Payer: Medicare FFS

## 2016-10-06 ENCOUNTER — Ambulatory Visit
Admission: RE | Admit: 2016-10-06 | Discharge: 2016-10-06 | Disposition: A | Discharge status: Home / Self Care | Payer: Medicare FFS | Source: Ambulatory Visit | Attending: Radiation Oncology | Admitting: Radiation Oncology

## 2016-10-06 DIAGNOSIS — Z510 Encounter for antineoplastic radiation therapy: Secondary | ICD-10-CM | POA: Diagnosis not present

## 2016-10-09 ENCOUNTER — Ambulatory Visit: Payer: Medicare FFS

## 2016-10-09 ENCOUNTER — Ambulatory Visit
Admission: RE | Admit: 2016-10-09 | Discharge: 2016-10-09 | Disposition: A | Discharge status: Home / Self Care | Payer: Medicare FFS | Source: Ambulatory Visit | Attending: Radiation Oncology | Admitting: Radiation Oncology

## 2016-10-09 ENCOUNTER — Encounter: Payer: Self-pay | Admitting: Radiation Oncology

## 2016-10-09 DIAGNOSIS — Z510 Encounter for antineoplastic radiation therapy: Secondary | ICD-10-CM | POA: Diagnosis not present

## 2016-10-11 NOTE — Progress Notes (Signed)
  Radiation Oncology         [phone] ________________________________  Name: Brenda Harding MRN: [id number]  Date: 10/09/2016  DOB: 11/03/1955  End of Treatment Note  Diagnosis:    Clinical: Stage IIA (cT2N0) grade 2-3 invasive ductal carcinoma of the right breast (ER+/PR-/HER2+) Pathologic: ypTisypN0 high grade DCIS of the right breast  Indication for treatment:  Curative, post-op and neoadjuvant chemotherapy to reduce the risk of recurrence  Radiation treatment dates:   09/11/16 - 10/09/16  Site/dose:    1) Right breast: 42.5 Gy in 16 fractions 2) Right breast boost: 10 Gy in 4 fractions  Beams/energy:  1) 3D // 6X, 10X Photon 2) 3D // [place] Photon  Narrative: The patient tolerated radiation treatment relatively well. The patient had moderate radiation changes with increased dermatitis present in the upper inner aspect of the right breast. She had increased itching of the treatment area and radiaplex was switched to biafine.  Plan: The patient has completed radiation treatment. The patient will return to radiation oncology clinic for routine followup in one month. I advised them to call or return sooner if they have any questions or concerns related to their recovery or treatment.  ------------------------------------------------  Jodelle Gross, MD, PhD  This document serves as a record of services personally performed by Kyung Rudd, MD. It was created on his behalf by Darcus Austin, a trained medical scribe. The creation of this record is based on the scribe's personal observations and the provider's statements to them. This document has been checked and approved by the attending provider.

## 2016-10-18 ENCOUNTER — Ambulatory Visit (HOSPITAL_BASED_OUTPATIENT_CLINIC_OR_DEPARTMENT_OTHER): Payer: Medicare FFS

## 2016-10-18 DIAGNOSIS — Z5112 Encounter for antineoplastic immunotherapy: Secondary | ICD-10-CM

## 2016-10-18 DIAGNOSIS — C50411 Malignant neoplasm of upper-outer quadrant of right female breast: Secondary | ICD-10-CM | POA: Diagnosis not present

## 2016-10-18 MED ORDER — TRASTUZUMAB CHEMO 150 MG IV SOLR
450.0000 mg | Freq: Once | INTRAVENOUS | Status: AC
  Administered 2016-10-18: 450 mg via INTRAVENOUS
  Filled 2016-10-18: qty 21.43

## 2016-10-18 MED ORDER — DIPHENHYDRAMINE HCL 25 MG PO CAPS
50.0000 mg | ORAL_CAPSULE | Freq: Once | ORAL | Status: AC
  Administered 2016-10-18: 50 mg via ORAL

## 2016-10-18 MED ORDER — ACETAMINOPHEN 325 MG PO TABS
650.0000 mg | ORAL_TABLET | Freq: Once | ORAL | Status: AC
  Administered 2016-10-18: 650 mg via ORAL

## 2016-10-18 MED ORDER — SODIUM CHLORIDE 0.9 % IV SOLN
Freq: Once | INTRAVENOUS | Status: AC
  Administered 2016-10-18: 10:00:00 via INTRAVENOUS

## 2016-10-18 MED ORDER — PERTUZUMAB CHEMO INJECTION 420 MG/14ML
420.0000 mg | Freq: Once | INTRAVENOUS | Status: AC
  Administered 2016-10-18: 420 mg via INTRAVENOUS
  Filled 2016-10-18: qty 14

## 2016-10-18 MED ORDER — HEPARIN SOD (PORK) LOCK FLUSH 100 UNIT/ML IV SOLN
500.0000 [IU] | Freq: Once | INTRAVENOUS | Status: AC | PRN
  Administered 2016-10-18: 500 [IU]
  Filled 2016-10-18: qty 5

## 2016-10-18 MED ORDER — SODIUM CHLORIDE 0.9% FLUSH
10.0000 mL | INTRAVENOUS | Status: DC | PRN
  Administered 2016-10-18: 10 mL
  Filled 2016-10-18: qty 10

## 2016-10-18 NOTE — Patient Instructions (Signed)
[facility] Discharge Instructions for Patients Receiving Chemotherapy  Today you received the following chemotherapy agents Herceptin/Perjeta To help prevent nausea and vomiting after your treatment, we encourage you to take your nausea medication as prescribed.   If you develop nausea and vomiting that is not controlled by your nausea medication, call the clinic.   BELOW ARE SYMPTOMS THAT SHOULD BE REPORTED IMMEDIATELY:  *FEVER GREATER THAN 100.5 F  *CHILLS WITH OR WITHOUT FEVER  NAUSEA AND VOMITING THAT IS NOT CONTROLLED WITH YOUR NAUSEA MEDICATION  *UNUSUAL SHORTNESS OF BREATH  *UNUSUAL BRUISING OR BLEEDING  TENDERNESS IN MOUTH AND THROAT WITH OR WITHOUT PRESENCE OF ULCERS  *URINARY PROBLEMS  *BOWEL PROBLEMS  UNUSUAL RASH Items with * indicate a potential emergency and should be followed up as soon as possible.  Feel free to call the clinic you have any questions or concerns. The clinic phone number is [phone].  Please show the CHEMO ALERT CARD at check-in to the Emergency Department and triage nurse.  

## 2016-10-30 ENCOUNTER — Telehealth: Payer: Self-pay | Admitting: Hematology and Oncology

## 2016-10-30 NOTE — Telephone Encounter (Signed)
Faxed records to [employer]. 

## 2016-11-08 ENCOUNTER — Other Ambulatory Visit (HOSPITAL_BASED_OUTPATIENT_CLINIC_OR_DEPARTMENT_OTHER): Payer: Medicare FFS

## 2016-11-08 ENCOUNTER — Ambulatory Visit: Payer: Medicare FFS

## 2016-11-08 ENCOUNTER — Ambulatory Visit (HOSPITAL_BASED_OUTPATIENT_CLINIC_OR_DEPARTMENT_OTHER): Payer: Medicare FFS

## 2016-11-08 ENCOUNTER — Encounter: Payer: Self-pay | Admitting: Hematology and Oncology

## 2016-11-08 ENCOUNTER — Ambulatory Visit (HOSPITAL_BASED_OUTPATIENT_CLINIC_OR_DEPARTMENT_OTHER): Payer: Medicare FFS | Admitting: Hematology and Oncology

## 2016-11-08 DIAGNOSIS — Z5112 Encounter for antineoplastic immunotherapy: Secondary | ICD-10-CM

## 2016-11-08 DIAGNOSIS — C50411 Malignant neoplasm of upper-outer quadrant of right female breast: Secondary | ICD-10-CM

## 2016-11-08 DIAGNOSIS — Z170 Estrogen receptor positive status [ER+]: Secondary | ICD-10-CM

## 2016-11-08 DIAGNOSIS — K521 Toxic gastroenteritis and colitis: Secondary | ICD-10-CM

## 2016-11-08 LAB — CBC WITH DIFFERENTIAL/PLATELET
BASO%: 0.9 % (ref 0.0–2.0)
Basophils Absolute: 0 10*3/uL (ref 0.0–0.1)
EOS%: 1.8 % (ref 0.0–7.0)
Eosinophils Absolute: 0.1 10*3/uL (ref 0.0–0.5)
HCT: 39.1 % (ref 34.8–46.6)
HGB: 13.1 g/dL (ref 11.6–15.9)
LYMPH%: 21.6 % (ref 14.0–49.7)
MCH: 32.5 pg (ref 25.1–34.0)
MCHC: 33.5 g/dL (ref 31.5–36.0)
MCV: 97 fL (ref 79.5–101.0)
MONO#: 0.4 10*3/uL (ref 0.1–0.9)
MONO%: 8.9 % (ref 0.0–14.0)
NEUT#: 2.9 10*3/uL (ref 1.5–6.5)
NEUT%: 66.8 % (ref 38.4–76.8)
Platelets: 228 10*3/uL (ref 145–400)
RBC: 4.03 10*6/uL (ref 3.70–5.45)
RDW: 12.5 % (ref 11.2–14.5)
WBC: 4.4 10*3/uL (ref 3.9–10.3)
lymph#: 0.9 10*3/uL (ref 0.9–3.3)

## 2016-11-08 LAB — COMPREHENSIVE METABOLIC PANEL
ALT: 13 U/L (ref 0–55)
AST: 15 U/L (ref 5–34)
Albumin: 4.2 g/dL (ref 3.5–5.0)
Alkaline Phosphatase: 192 U/L — ABNORMAL HIGH (ref 40–150)
Anion Gap: 14 mEq/L — ABNORMAL HIGH (ref 3–11)
BUN: 21.7 mg/dL (ref 7.0–26.0)
CO2: 25 mEq/L (ref 22–29)
Calcium: 9.7 mg/dL (ref 8.4–10.4)
Chloride: 103 mEq/L (ref 98–109)
Creatinine: 1.3 mg/dL — ABNORMAL HIGH (ref 0.6–1.1)
EGFR: 46 mL/min/{1.73_m2} — ABNORMAL LOW (ref >90)
Glucose: 99 mg/dl (ref 70–140)
Potassium: 4.5 mEq/L (ref 3.5–5.1)
Sodium: 142 mEq/L (ref 136–145)
Total Bilirubin: 0.62 mg/dL (ref 0.20–1.20)
Total Protein: 7.4 g/dL (ref 6.4–8.3)

## 2016-11-08 MED ORDER — SODIUM CHLORIDE 0.9 % IV SOLN
420.0000 mg | Freq: Once | INTRAVENOUS | Status: AC
  Administered 2016-11-08: 420 mg via INTRAVENOUS
  Filled 2016-11-08: qty 14

## 2016-11-08 MED ORDER — HEPARIN SOD (PORK) LOCK FLUSH 100 UNIT/ML IV SOLN
500.0000 [IU] | Freq: Once | INTRAVENOUS | Status: AC | PRN
  Administered 2016-11-08: 500 [IU]
  Filled 2016-11-08: qty 5

## 2016-11-08 MED ORDER — SODIUM CHLORIDE 0.9% FLUSH
10.0000 mL | INTRAVENOUS | Status: DC | PRN
  Administered 2016-11-08: 10 mL
  Filled 2016-11-08: qty 10

## 2016-11-08 MED ORDER — ACETAMINOPHEN 325 MG PO TABS
650.0000 mg | ORAL_TABLET | Freq: Once | ORAL | Status: AC
  Administered 2016-11-08: 650 mg via ORAL

## 2016-11-08 MED ORDER — SODIUM CHLORIDE 0.9 % IJ SOLN
10.0000 mL | Freq: Once | INTRAMUSCULAR | Status: AC
  Administered 2016-11-08: 10 mL
  Filled 2016-11-08: qty 10

## 2016-11-08 MED ORDER — ACETAMINOPHEN 325 MG PO TABS
ORAL_TABLET | ORAL | Status: AC
  Filled 2016-11-08: qty 2

## 2016-11-08 MED ORDER — SODIUM CHLORIDE 0.9 % IV SOLN
Freq: Once | INTRAVENOUS | Status: AC
  Administered 2016-11-08: 16:00:00 via INTRAVENOUS

## 2016-11-08 MED ORDER — DIPHENHYDRAMINE HCL 25 MG PO CAPS
50.0000 mg | ORAL_CAPSULE | Freq: Once | ORAL | Status: AC
  Administered 2016-11-08: 50 mg via ORAL

## 2016-11-08 MED ORDER — TRASTUZUMAB CHEMO 150 MG IV SOLR
450.0000 mg | Freq: Once | INTRAVENOUS | Status: AC
  Administered 2016-11-08: 450 mg via INTRAVENOUS
  Filled 2016-11-08: qty 21.43

## 2016-11-08 MED ORDER — DIPHENHYDRAMINE HCL 25 MG PO CAPS
ORAL_CAPSULE | ORAL | Status: AC
  Filled 2016-11-08: qty 2

## 2016-11-08 NOTE — Progress Notes (Signed)
Patient Care Team: Tobe Sos, MD as PCP - General (Internal Medicine) Maisie Fus, MD as Consulting Physician (Obstetrics and Gynecology) [name], MD as Consulting Physician (General Surgery) Nicholas Lose, MD as Consulting Physician (Hematology and Oncology) Kyung Rudd, MD as Consulting Physician (Radiation Oncology)  DIAGNOSIS:  Encounter Diagnosis  Name Primary?  . Malignant neoplasm of upper-outer quadrant of right breast in female, estrogen receptor positive ([place])     SUMMARY OF ONCOLOGIC HISTORY:   Malignant neoplasm of upper-outer quadrant of right female breast ([place])   02/24/2016 Initial Diagnosis    Right breast biopsy 1:00, grade 2-3 IDC, ER 5%, PR 0%, Ki-67 90%, HER-2 positive ratio 7.28, right breast biopsy 1:00 1.5 cm medial to the dominant mass fibrocystic changes; ultrasound and mammogram 02/23/2016: 2.5 cm mass at 1:00 position and a small 68m irregular mass 1.5 cm medial to the larger mass, T2 N0 stage II a clinical stage      03/04/2016 Breast MRI    2.9 cm irregular enhancing mass located within the right breast at 1:00 position, several T2 bright lesions in the right lobe of the liver incompletely visualized could be hepatic cysts      03/22/2016 - 07/05/2016 Neo-Adjuvant Chemotherapy    Neo-Adj TCHP 5 cycles followed by Herceptin and Perjeta      07/07/2016 Breast MRI    Right breast cancer: No measurable enhancement, residual 1-2 mm foci; left breast partially cystic lesion at 2:30 position 2.1 cm which is new 1 indeterminate left axillary lymph node      08/09/2016 Surgery    Right lumpectomy: 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, no residual invasive cancer, 0/4 lymph nodes negative, ER 5% week, PR negative, HER-2 positive, Ki-67 90%: Complete pathologic response ypTis ypN0      09/11/2016 - 10/09/2016 Radiation Therapy    Adj XRT       CHIEF COMPLIANT: Follow-up of Herceptin and Perjeta  INTERVAL HISTORY: Brenda FRYis a 61year old with  above-mentioned history of right breast cancer treated with lumpectomy after undergoing neoadjuvant chemotherapy. She is currently on Herceptin and Perjeta and appears to be tolerating it moderately well. She does have diarrhea that is intermittent in nature. Does not appear to be bothering her. She denies any nausea vomiting. She completed adjuvant radiation therapy in 10/09/2016. She is here to discuss different antiestrogen treatment options.  REVIEW OF SYSTEMS:   Constitutional: Denies fevers, chills or abnormal weight loss Eyes: Denies blurriness of vision Ears, nose, mouth, throat, and face: Denies mucositis or sore throat Respiratory: Denies cough, dyspnea or wheezes Cardiovascular: Denies palpitation, chest discomfort Gastrointestinal:  Denies nausea, heartburn or change in bowel habits Skin: Denies abnormal skin rashes Lymphatics: Denies new lymphadenopathy or easy bruising Neurological:Denies numbness, tingling or new weaknesses Behavioral/Psych: Mood is stable, no new changes  Extremities: No lower extremity edema Breast:  denies any pain or lumps or nodules in either breasts All other systems were reviewed with the patient and are negative.  I have reviewed the past medical history, past surgical history, social history and family history with the patient and they are unchanged from previous note.  ALLERGIES:  is allergic to propoxyphene; inapsine [droperidol]; latex; and levaquin [levofloxacin].  MEDICATIONS:  Current Outpatient Prescriptions  Medication Sig Dispense Refill  . albuterol (PROVENTIL HFA;VENTOLIN HFA) 108 (90 Base) MCG/ACT inhaler Inhale 2 puffs into the lungs 2 (two) times daily as needed for wheezing or shortness of breath.     . ALPRAZolam (XANAX) 0.5 MG tablet  Take 1 tablet (0.5 mg total) by mouth 2 (two) times daily as needed (for anxiety). 60 tablet 0  . betamethasone dipropionate (DIPROLENE) 0.05 % cream Apply 1 application topically every Monday,  Wednesday, and Friday. Applied to vagina    . Biotin 10000 MCG TABS Take by mouth.    . chlorpheniramine-HYDROcodone (TUSSIONEX PENNKINETIC ER) 10-8 MG/5ML SUER Take 5 mLs by mouth at bedtime as needed for cough.    . Cholecalciferol (VITAMIN D3 PO) Take 2,500 Units by mouth 2 (two) times a week.     . diphenoxylate-atropine (LOMOTIL) 2.5-0.025 MG tablet Take 1 tablet by mouth 4 (four) times daily as needed for diarrhea or loose stools. 30 tablet 0  . GARLIC PO Take 1,660 mg by mouth daily.     Marland Kitchen guaiFENesin (MUCINEX) 600 MG 12 hr tablet Take 600 mg by mouth 2 (two) times daily as needed for cough or to loosen phlegm.     . hyaluronate sodium (RADIAPLEXRX) GEL Apply 1 application topically 2 (two) times daily.    Marland Kitchen ibuprofen (ADVIL,MOTRIN) 800 MG tablet 800 mg every 8 (eight) hours as needed (for pain.).     Marland Kitchen lansoprazole (PREVACID) 30 MG capsule Take 30 mg by mouth daily.     Marland Kitchen levocetirizine (XYZAL) 5 MG tablet Take 5 mg by mouth every evening.    . montelukast (SINGULAIR) 10 MG tablet Take 10 mg by mouth daily.     . non-metallic deodorant Jethro Poling) MISC Apply 1 application topically daily as needed.    . ondansetron (ZOFRAN) 8 MG tablet Take 1 tablet (8 mg total) by mouth 2 (two) times daily as needed for refractory nausea / vomiting. Start on day 3 after chemo. (Patient not taking: Reported on 10/02/2016) 30 tablet 1  . PREMARIN vaginal cream Place 1 Applicatorful vaginally 2 (two) times a week. Tuesday & Saturday    . spironolactone (ALDACTONE) 50 MG tablet Take 50 mg by mouth 2 (two) times daily.     . Triamcinolone Acetonide (NASACORT ALLERGY 24HR NA) Place 2 sprays into the nose daily.     . verapamil (CALAN-SR) 240 MG CR tablet Take 240 mg by mouth daily.     No current facility-administered medications for this visit.    Facility-Administered Medications Ordered in Other Visits  Medication Dose Route Frequency Provider Last Rate Last Dose  . heparin lock flush 100 unit/mL  500 Units  Intracatheter Once PRN Nicholas Lose, MD      . pertuzumab (PERJETA) 420 mg in sodium chloride 0.9 % 250 mL chemo infusion  420 mg Intravenous Once Nicholas Lose, MD      . sodium chloride flush (NS) 0.9 % injection 10 mL  10 mL Intravenous PRN Nicholas Lose, MD   10 mL at 09/27/16 1326  . sodium chloride flush (NS) 0.9 % injection 10 mL  10 mL Intracatheter PRN Nicholas Lose, MD      . trastuzumab (HERCEPTIN) 450 mg in sodium chloride 0.9 % 250 mL chemo infusion  450 mg Intravenous Once Nicholas Lose, MD 542.9 mL/hr at 11/08/16 1619 450 mg at 11/08/16 1619    PHYSICAL EXAMINATION: ECOG PERFORMANCE STATUS: 1 - Symptomatic but completely ambulatory  Vitals:   11/08/16 1411  BP: 122/73  Pulse: 87  Resp: 20  Temp: 97.7 F (36.5 C)   Filed Weights   11/08/16 1411  Weight: 157 lb 4.8 oz (71.4 kg)    GENERAL:alert, no distress and comfortable SKIN: skin color, texture, turgor are normal, no rashes  or significant lesions EYES: normal, Conjunctiva are pink and non-injected, sclera clear OROPHARYNX:no exudate, no erythema and lips, buccal mucosa, and tongue normal  NECK: supple, thyroid normal size, non-tender, without nodularity LYMPH:  no palpable lymphadenopathy in the cervical, axillary or inguinal LUNGS: clear to auscultation and percussion with normal breathing effort HEART: regular rate & rhythm and no murmurs and no lower extremity edema ABDOMEN:abdomen soft, non-tender and normal bowel sounds MUSCULOSKELETAL:no cyanosis of digits and no clubbing  NEURO: alert & oriented x 3 with fluent speech, no focal motor/sensory deficits EXTREMITIES: No lower extremity edema  LABORATORY DATA:  I have reviewed the data as listed   Chemistry      Component Value Date/Time   NA 142 11/08/2016 1331   K 4.5 11/08/2016 1331   CL 104 03/20/2016 0927   CO2 25 11/08/2016 1331   BUN 21.7 11/08/2016 1331   CREATININE 1.3 (H) 11/08/2016 1331      Component Value Date/Time   CALCIUM 9.7  11/08/2016 1331   ALKPHOS 192 (H) 11/08/2016 1331   AST 15 11/08/2016 1331   ALT 13 11/08/2016 1331   BILITOT 0.62 11/08/2016 1331       Lab Results  Component Value Date   WBC 4.4 11/08/2016   HGB 13.1 11/08/2016   HCT 39.1 11/08/2016   MCV 97.0 11/08/2016   PLT 228 11/08/2016   NEUTROABS 2.9 11/08/2016    ASSESSMENT & PLAN:  Malignant neoplasm of upper-outer quadrant of right female breast ([place]) Right breast biopsy 1:00, 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  Recommendationbased on multidisciplinary tumor board: 1. Neoadjuvant chemotherapy with TCH Perjeta 6 cycles followed by Herceptinand Perjetamaintenance for 1 year 03/22/2016- 07/05/2017 2. Breast conserving surgery if possible with sentinel lymph node study: 08/08/2016 3. Followed by adjuvant radiation therapy started 09/11/16 4. Followed by antiestrogen therapy  Right lumpectomy 08/08/2016: 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, no residual invasive cancer, 0/4 lymph nodes negative, ER 5% weak, PR negative, HER-2 positive, Ki-67 90%: Complete pathologic response ypTis ypN0 ------------------------------------------------------------------------------- Current Treatment: Herceptin and Perjeta Maintenance Diarrhea due to Perjeta I discussed with her about different treatment options for antiestrogen therapy. We discussed the differences between side effects of tamoxifen and anastrozole. Patient believes that she has severe osteopenia or osteoporosis. She  will try to obtain the results of the bone density test so that we can determine which medication she needs to take. I recommended that she take anastrozole but we will see what the bone density test shows  She also takes Premarin cream vaginally for vaginal dryness.  RTC in 6 weeks for follow up and every 3 weeks for Herceptin and Perjeta.    I spent 25 minutes talking to the patient of which more than half was spent in counseling and coordination of care.  No orders of the defined types were placed in this encounter.  The patient has a good understanding of the overall plan. she agrees with it. she will call with any problems that may develop before the next  visit here.   Rulon Eisenmenger, MD 11/08/16

## 2016-11-08 NOTE — Assessment & Plan Note (Signed)
Right breast biopsy 1:00, grade 2-3 IDC, ER 5%, PR 0%, Ki-67 90%, HER-2 positive ratio 7.28, right breast biopsy 1:00 1.5 cm medial to the dominant mass fibrocystic changes; ultrasound and mammogram 02/23/2016: 2.5 cm mass at 1:00 position and a small 17m irregular mass 1.5 cm medial to the larger mass, T2 N0 stage II a clinical stage  Breast MRI 03/04/2016: 2.9 cm irregular enhancing mass located within the right breast at 1:00 position, several T2 bright lesions in the right lobe of the liver incompletely visualized could be hepatic cysts.  Recommendationbased on multidisciplinary tumor board: 1. Neoadjuvant chemotherapy with TCH Perjeta 6 cycles followed by Herceptinand Perjetamaintenance for 1 year 03/22/2016- 07/05/2017 2. Breast conserving surgery if possible with sentinel lymph node study: 08/08/2016 3. Followed by adjuvant radiation therapy started 09/11/16 4. Followed by antiestrogen therapy  Right lumpectomy 08/08/2016: 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, no residual invasive cancer, 0/4 lymph nodes negative, ER 5% week, PR negative, HER-2 positive, Ki-67 90%: Complete pathologic response ypTis ypN0 ------------------------------------------------------------------------------- Current Treatment: Herceptin and Perjeta Maintenance No major toxicities RTC in 6 weeks for follow up and every 3 weeks for Herceptin and Perjeta

## 2016-11-08 NOTE — Patient Instructions (Signed)
[facility] Discharge Instructions for Patients Receiving Chemotherapy  Today you received the following chemotherapy agents:  Herceptin, Perjeta  To help prevent nausea and vomiting after your treatment, we encourage you to take your nausea medication as prescribed.   If you develop nausea and vomiting that is not controlled by your nausea medication, call the clinic.   BELOW ARE SYMPTOMS THAT SHOULD BE REPORTED IMMEDIATELY:  *FEVER GREATER THAN 100.5 F  *CHILLS WITH OR WITHOUT FEVER  NAUSEA AND VOMITING THAT IS NOT CONTROLLED WITH YOUR NAUSEA MEDICATION  *UNUSUAL SHORTNESS OF BREATH  *UNUSUAL BRUISING OR BLEEDING  TENDERNESS IN MOUTH AND THROAT WITH OR WITHOUT PRESENCE OF ULCERS  *URINARY PROBLEMS  *BOWEL PROBLEMS  UNUSUAL RASH Items with * indicate a potential emergency and should be followed up as soon as possible.  Feel free to call the clinic you have any questions or concerns. The clinic phone number is [phone].  Please show the CHEMO ALERT CARD at check-in to the Emergency Department and triage nurse.   

## 2016-11-08 NOTE — Patient Instructions (Signed)
Implanted Port Home Guide An implanted port is a type of central line that is placed under the skin. Central lines are used to provide IV access when treatment or nutrition needs to be given through a person's veins. Implanted ports are used for long-term IV access. An implanted port may be placed because:  You need IV medicine that would be irritating to the small veins in your hands or arms.  You need long-term IV medicines, such as antibiotics.  You need IV nutrition for a long period.  You need frequent blood draws for lab tests.  You need dialysis.  Implanted ports are usually placed in the chest area, but they can also be placed in the upper arm, the abdomen, or the leg. An implanted port has two main parts:  Reservoir. The reservoir is round and will appear as a small, raised area under your skin. The reservoir is the part where a needle is inserted to give medicines or draw blood.  Catheter. The catheter is a thin, flexible tube that extends from the reservoir. The catheter is placed into a large vein. Medicine that is inserted into the reservoir goes into the catheter and then into the vein.  How will I care for my incision site? Do not get the incision site wet. Bathe or shower as directed by your health care provider. How is my port accessed? Special steps must be taken to access the port:  Before the port is accessed, a numbing cream can be placed on the skin. This helps numb the skin over the port site.  Your health care provider uses a sterile technique to access the port. ? Your health care provider must put on a mask and sterile gloves. ? The skin over your port is cleaned carefully with an antiseptic and allowed to dry. ? The port is gently pinched between sterile gloves, and a needle is inserted into the port.  Only "non-coring" port needles should be used to access the port. Once the port is accessed, a blood return should be checked. This helps ensure that the port  is in the vein and is not clogged.  If your port needs to remain accessed for a constant infusion, a clear (transparent) bandage will be placed over the needle site. The bandage and needle will need to be changed every week, or as directed by your health care provider.  Keep the bandage covering the needle clean and dry. Do not get it wet. Follow your health care provider's instructions on how to take a shower or bath while the port is accessed.  If your port does not need to stay accessed, no bandage is needed over the port.  What is flushing? Flushing helps keep the port from getting clogged. Follow your health care provider's instructions on how and when to flush the port. Ports are usually flushed with saline solution or a medicine called heparin. The need for flushing will depend on how the port is used.  If the port is used for intermittent medicines or blood draws, the port will need to be flushed: ? After medicines have been given. ? After blood has been drawn. ? As part of routine maintenance.  If a constant infusion is running, the port may not need to be flushed.  How long will my port stay implanted? The port can stay in for as long as your health care provider thinks it is needed. When it is time for the port to come out, surgery will be   done to remove it. The procedure is similar to the one performed when the port was put in. When should I seek immediate medical care? When you have an implanted port, you should seek immediate medical care if:  You notice a bad smell coming from the incision site.  You have swelling, redness, or drainage at the incision site.  You have more swelling or pain at the port site or the surrounding area.  You have a fever that is not controlled with medicine.  This information is not intended to replace advice given to you by your health care provider. Make sure you discuss any questions you have with your health care provider. Document  Released: 04/17/2005 Document Revised: 09/23/2015 Document Reviewed: 12/23/2012 Elsevier Interactive Patient Education  2017 [employer]vier Inc.  

## 2016-11-29 ENCOUNTER — Ambulatory Visit: Payer: Medicare FFS

## 2016-12-02 ENCOUNTER — Other Ambulatory Visit: Payer: Self-pay | Admitting: *Deleted

## 2016-12-04 ENCOUNTER — Ambulatory Visit (HOSPITAL_BASED_OUTPATIENT_CLINIC_OR_DEPARTMENT_OTHER): Payer: Medicare FFS

## 2016-12-04 DIAGNOSIS — Z170 Estrogen receptor positive status [ER+]: Secondary | ICD-10-CM

## 2016-12-04 DIAGNOSIS — C50411 Malignant neoplasm of upper-outer quadrant of right female breast: Secondary | ICD-10-CM

## 2016-12-04 DIAGNOSIS — Z5112 Encounter for antineoplastic immunotherapy: Secondary | ICD-10-CM | POA: Diagnosis not present

## 2016-12-04 LAB — COMPREHENSIVE METABOLIC PANEL
ALT: 12 U/L (ref 0–55)
AST: 16 U/L (ref 5–34)
Albumin: 3.9 g/dL (ref 3.5–5.0)
Alkaline Phosphatase: 171 U/L — ABNORMAL HIGH (ref 40–150)
Anion Gap: 7 mEq/L (ref 3–11)
BUN: 22.8 mg/dL (ref 7.0–26.0)
CO2: 27 mEq/L (ref 22–29)
Calcium: 9.5 mg/dL (ref 8.4–10.4)
Chloride: 106 mEq/L (ref 98–109)
Creatinine: 1.2 mg/dL — ABNORMAL HIGH (ref 0.6–1.1)
EGFR: 47 mL/min/{1.73_m2} — ABNORMAL LOW (ref >90)
Glucose: 100 mg/dl (ref 70–140)
Potassium: 4 mEq/L (ref 3.5–5.1)
Sodium: 140 mEq/L (ref 136–145)
Total Bilirubin: 0.38 mg/dL (ref 0.20–1.20)
Total Protein: 6.9 g/dL (ref 6.4–8.3)

## 2016-12-04 LAB — CBC WITH DIFFERENTIAL/PLATELET
BASO%: 0.5 % (ref 0.0–2.0)
Basophils Absolute: 0 10*3/uL (ref 0.0–0.1)
EOS%: 1.4 % (ref 0.0–7.0)
Eosinophils Absolute: 0.1 10*3/uL (ref 0.0–0.5)
HCT: 36.7 % (ref 34.8–46.6)
HGB: 12 g/dL (ref 11.6–15.9)
LYMPH%: 28.2 % (ref 14.0–49.7)
MCH: 32.1 pg (ref 25.1–34.0)
MCHC: 32.7 g/dL (ref 31.5–36.0)
MCV: 98.1 fL (ref 79.5–101.0)
MONO#: 0.4 10*3/uL (ref 0.1–0.9)
MONO%: 9.6 % (ref 0.0–14.0)
NEUT#: 2.2 10*3/uL (ref 1.5–6.5)
NEUT%: 60.3 % (ref 38.4–76.8)
Platelets: 185 10*3/uL (ref 145–400)
RBC: 3.74 10*6/uL (ref 3.70–5.45)
RDW: 13 % (ref 11.2–14.5)
WBC: 3.7 10*3/uL — ABNORMAL LOW (ref 3.9–10.3)
lymph#: 1 10*3/uL (ref 0.9–3.3)

## 2016-12-04 MED ORDER — DIPHENHYDRAMINE HCL 25 MG PO CAPS
ORAL_CAPSULE | ORAL | Status: AC
Start: 2016-12-04 — End: 2016-12-04
  Filled 2016-12-04: qty 2

## 2016-12-04 MED ORDER — SODIUM CHLORIDE 0.9 % IV SOLN
420.0000 mg | Freq: Once | INTRAVENOUS | Status: AC
  Administered 2016-12-04: 420 mg via INTRAVENOUS
  Filled 2016-12-04: qty 14

## 2016-12-04 MED ORDER — SODIUM CHLORIDE 0.9% FLUSH
10.0000 mL | INTRAVENOUS | Status: DC | PRN
  Administered 2016-12-04: 10 mL
  Filled 2016-12-04: qty 10

## 2016-12-04 MED ORDER — TRASTUZUMAB CHEMO 150 MG IV SOLR
450.0000 mg | Freq: Once | INTRAVENOUS | Status: AC
  Administered 2016-12-04: 450 mg via INTRAVENOUS
  Filled 2016-12-04: qty 21.43

## 2016-12-04 MED ORDER — ACETAMINOPHEN 325 MG PO TABS
650.0000 mg | ORAL_TABLET | Freq: Once | ORAL | Status: AC
  Administered 2016-12-04: 650 mg via ORAL

## 2016-12-04 MED ORDER — HEPARIN SOD (PORK) LOCK FLUSH 100 UNIT/ML IV SOLN
500.0000 [IU] | Freq: Once | INTRAVENOUS | Status: AC | PRN
  Administered 2016-12-04: 500 [IU]
  Filled 2016-12-04: qty 5

## 2016-12-04 MED ORDER — SODIUM CHLORIDE 0.9 % IV SOLN
Freq: Once | INTRAVENOUS | Status: AC
  Administered 2016-12-04: 10:00:00 via INTRAVENOUS

## 2016-12-04 MED ORDER — ACETAMINOPHEN 325 MG PO TABS
ORAL_TABLET | ORAL | Status: AC
  Filled 2016-12-04: qty 2

## 2016-12-04 MED ORDER — DIPHENHYDRAMINE HCL 25 MG PO CAPS
50.0000 mg | ORAL_CAPSULE | Freq: Once | ORAL | Status: AC
  Administered 2016-12-04: 50 mg via ORAL

## 2016-12-04 NOTE — Patient Instructions (Signed)
[facility] Discharge Instructions for Patients Receiving Chemotherapy  Today you received the following chemotherapy agents:  Herceptin, Perjeta  To help prevent nausea and vomiting after your treatment, we encourage you to take your nausea medication as prescribed.   If you develop nausea and vomiting that is not controlled by your nausea medication, call the clinic.   BELOW ARE SYMPTOMS THAT SHOULD BE REPORTED IMMEDIATELY:  *FEVER GREATER THAN 100.5 F  *CHILLS WITH OR WITHOUT FEVER  NAUSEA AND VOMITING THAT IS NOT CONTROLLED WITH YOUR NAUSEA MEDICATION  *UNUSUAL SHORTNESS OF BREATH  *UNUSUAL BRUISING OR BLEEDING  TENDERNESS IN MOUTH AND THROAT WITH OR WITHOUT PRESENCE OF ULCERS  *URINARY PROBLEMS  *BOWEL PROBLEMS  UNUSUAL RASH Items with * indicate a potential emergency and should be followed up as soon as possible.  Feel free to call the clinic you have any questions or concerns. The clinic phone number is [phone].  Please show the CHEMO ALERT CARD at check-in to the Emergency Department and triage nurse.   

## 2016-12-19 ENCOUNTER — Ambulatory Visit
Admission: RE | Admit: 2016-12-19 | Discharge: 2016-12-19 | Disposition: A | Discharge status: Home / Self Care | Payer: Medicare FFS | Source: Ambulatory Visit | Attending: Radiation Oncology | Admitting: Radiation Oncology

## 2016-12-19 ENCOUNTER — Encounter: Payer: Self-pay | Admitting: Radiation Oncology

## 2016-12-19 DIAGNOSIS — Z79899 Other long term (current) drug therapy: Secondary | ICD-10-CM | POA: Insufficient documentation

## 2016-12-19 DIAGNOSIS — C50411 Malignant neoplasm of upper-outer quadrant of right female breast: Secondary | ICD-10-CM | POA: Diagnosis not present

## 2016-12-19 NOTE — Addendum Note (Signed)
Encounter addended by: Malena Edman, RN on: 12/19/2016 10:35 AM<BR>    Actions taken: Visit diagnoses modified, Charge Capture section accepted

## 2016-12-19 NOTE — Progress Notes (Signed)
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 During treatment she did very well with radiotherapy and did not have significant desquamation.                             On review of systems, the patient states she's doing great. She's using sunscreen over her upper right chest wall. She denies any concerns with her skin, and reports that she will plan to follow up with Dr. Lindi Adie next week. No other complaints are noted.  ALLERGIES:  is allergic to propoxyphene; inapsine [droperidol]; latex; and levaquin [levofloxacin].  Meds: Current Outpatient Prescriptions  Medication Sig Dispense Refill  . albuterol (PROVENTIL HFA;VENTOLIN HFA) 108 (90 Base) MCG/ACT inhaler Inhale 2 puffs into the lungs 2 (two) times daily as needed for wheezing or shortness of breath.     . ALPRAZolam (XANAX) 0.5 MG tablet Take 1 tablet (0.5 mg total) by mouth 2 (two) times daily as needed (for anxiety). 60 tablet 0  . betamethasone dipropionate (DIPROLENE) 0.05 % cream Apply 1 application topically every Monday, Wednesday, and Friday. Applied to vagina    . Biotin 10000 MCG TABS Take by mouth.    . chlorpheniramine-HYDROcodone (TUSSIONEX PENNKINETIC ER) 10-8 MG/5ML SUER Take 5 mLs by mouth at bedtime as needed for cough.    . Cholecalciferol (VITAMIN D3 PO) Take 2,500 Units by mouth 2 (two) times a week.     . diphenoxylate-atropine (LOMOTIL) 2.5-0.025 MG tablet Take 1 tablet by mouth 4 (four) times daily as needed for diarrhea or  loose stools. 30 tablet 0  . GARLIC PO Take 7,628 mg by mouth daily.     Marland Kitchen guaiFENesin (MUCINEX) 600 MG 12 hr tablet Take 600 mg by mouth 2 (two) times daily as needed for cough or to loosen phlegm.     Marland Kitchen ibuprofen (ADVIL,MOTRIN) 800 MG tablet 800 mg every 8 (eight) hours as needed (for pain.).     Marland Kitchen lansoprazole (PREVACID) 30 MG capsule Take 30 mg by mouth daily.     Marland Kitchen levocetirizine (XYZAL) 5 MG tablet Take 5 mg by mouth every evening.    . montelukast (SINGULAIR) 10 MG tablet Take 10 mg by mouth daily.     Marland Kitchen PREMARIN vaginal cream Place 1 Applicatorful vaginally 2 (two) times a week. Tuesday & Saturday    . spironolactone (ALDACTONE) 50 MG tablet Take 50 mg by mouth 2 (two) times daily.     . Triamcinolone Acetonide (NASACORT ALLERGY 24HR NA) Place 2 sprays into the nose daily.     . verapamil (CALAN-SR) 240 MG CR tablet Take 240 mg by mouth daily.    . ondansetron (ZOFRAN) 8 MG tablet Take 1 tablet (8 mg total) by mouth 2 (two) times daily as needed for refractory nausea / vomiting. Start on day 3 after chemo. (Patient not taking: Reported on 10/02/2016) 30 tablet 1   No current facility-administered medications for this encounter.  Facility-Administered Medications Ordered in Other Encounters  Medication Dose Route Frequency Provider Last Rate Last Dose  . sodium chloride flush (NS) 0.9 % injection 10 mL  10 mL Intravenous PRN Nicholas Lose, MD   10 mL at 09/27/16 1326    Physical Findings:  height is '5\' 5"'  (1.651 m) and weight is 158 lb (71.7 kg). Her oral temperature is 97.6 F (36.4 C). Her blood pressure is 133/81 and her pulse is 73. Her respiration is 18 and oxygen saturation is 97%.  Pain Assessment Pain Score: 0-No pain/10 In general this is a well appearing caucasian female in no acute distress. She's alert and oriented x4 and appropriate throughout the examination. Cardiopulmonary assessment is negative for acute distress and she exhibits normal effort. The right breast was  examined and reveals no desquamation. No hyperpigmentation is noted.   Lab Findings: Lab Results  Component Value Date   WBC 3.7 (L) 12/04/2016   HGB 12.0 12/04/2016   HCT 36.7 12/04/2016   MCV 98.1 12/04/2016   PLT 185 12/04/2016     Radiographic Findings: No results found.  Impression/Plan: 1. IIA (cT2N0) grade 2-3 invasive ductal carcinoma of the right breast (ER+/PR-/HER2+). The patient has been doing well since completion of radiotherapy. We discussed that we would be happy to continue to follow her as needed, but she will also continue to follow up with Dr. Lindi Adie in medical oncology. She was counseled on skin care as well as measures to avoid sun exposure to this area.  2. Survivorship. The patient will be referred once she meets with Dr. Lindi Adie. I gave her information of the activities ongoing here at the cancer center.    Carola Rhine, PAC

## 2016-12-25 ENCOUNTER — Ambulatory Visit: Payer: Medicare FFS

## 2016-12-25 ENCOUNTER — Telehealth: Payer: Self-pay | Admitting: Hematology and Oncology

## 2016-12-25 ENCOUNTER — Other Ambulatory Visit (HOSPITAL_BASED_OUTPATIENT_CLINIC_OR_DEPARTMENT_OTHER): Payer: Medicare FFS

## 2016-12-25 ENCOUNTER — Encounter: Payer: Self-pay | Admitting: Hematology and Oncology

## 2016-12-25 ENCOUNTER — Ambulatory Visit (HOSPITAL_BASED_OUTPATIENT_CLINIC_OR_DEPARTMENT_OTHER): Payer: Medicare FFS | Admitting: Hematology and Oncology

## 2016-12-25 ENCOUNTER — Ambulatory Visit (HOSPITAL_BASED_OUTPATIENT_CLINIC_OR_DEPARTMENT_OTHER): Payer: Medicare FFS

## 2016-12-25 DIAGNOSIS — C50411 Malignant neoplasm of upper-outer quadrant of right female breast: Secondary | ICD-10-CM

## 2016-12-25 DIAGNOSIS — Z170 Estrogen receptor positive status [ER+]: Secondary | ICD-10-CM

## 2016-12-25 DIAGNOSIS — Z5112 Encounter for antineoplastic immunotherapy: Secondary | ICD-10-CM

## 2016-12-25 DIAGNOSIS — Z95828 Presence of other vascular implants and grafts: Secondary | ICD-10-CM

## 2016-12-25 LAB — CBC WITH DIFFERENTIAL/PLATELET
BASO%: 1.1 % (ref 0.0–2.0)
Basophils Absolute: 0 10*3/uL (ref 0.0–0.1)
EOS%: 1.9 % (ref 0.0–7.0)
Eosinophils Absolute: 0.1 10*3/uL (ref 0.0–0.5)
HCT: 36.5 % (ref 34.8–46.6)
HGB: 12.4 g/dL (ref 11.6–15.9)
LYMPH%: 26.2 % (ref 14.0–49.7)
MCH: 32.8 pg (ref 25.1–34.0)
MCHC: 34 g/dL (ref 31.5–36.0)
MCV: 96.5 fL (ref 79.5–101.0)
MONO#: 0.4 10*3/uL (ref 0.1–0.9)
MONO%: 10.6 % (ref 0.0–14.0)
NEUT#: 2.2 10*3/uL (ref 1.5–6.5)
NEUT%: 60.2 % (ref 38.4–76.8)
Platelets: 210 10*3/uL (ref 145–400)
RBC: 3.78 10*6/uL (ref 3.70–5.45)
RDW: 13.5 % (ref 11.2–14.5)
WBC: 3.6 10*3/uL — ABNORMAL LOW (ref 3.9–10.3)
lymph#: 0.9 10*3/uL (ref 0.9–3.3)

## 2016-12-25 LAB — COMPREHENSIVE METABOLIC PANEL
ALT: 13 U/L (ref 0–55)
AST: 17 U/L (ref 5–34)
Albumin: 4 g/dL (ref 3.5–5.0)
Alkaline Phosphatase: 177 U/L — ABNORMAL HIGH (ref 40–150)
Anion Gap: 8 mEq/L (ref 3–11)
BUN: 20.9 mg/dL (ref 7.0–26.0)
CO2: 27 mEq/L (ref 22–29)
Calcium: 10 mg/dL (ref 8.4–10.4)
Chloride: 105 mEq/L (ref 98–109)
Creatinine: 1.2 mg/dL — ABNORMAL HIGH (ref 0.6–1.1)
EGFR: 49 mL/min/{1.73_m2} — ABNORMAL LOW (ref >90)
Glucose: 92 mg/dl (ref 70–140)
Potassium: 4.3 mEq/L (ref 3.5–5.1)
Sodium: 140 mEq/L (ref 136–145)
Total Bilirubin: 0.61 mg/dL (ref 0.20–1.20)
Total Protein: 7.1 g/dL (ref 6.4–8.3)

## 2016-12-25 MED ORDER — SODIUM CHLORIDE 0.9% FLUSH
10.0000 mL | INTRAVENOUS | Status: AC | PRN
  Administered 2016-12-25 (×2): 10 mL
  Filled 2016-12-25: qty 10

## 2016-12-25 MED ORDER — SODIUM CHLORIDE 0.9 % IV SOLN
420.0000 mg | Freq: Once | INTRAVENOUS | Status: AC
  Administered 2016-12-25: 420 mg via INTRAVENOUS
  Filled 2016-12-25: qty 14

## 2016-12-25 MED ORDER — ACETAMINOPHEN 325 MG PO TABS
ORAL_TABLET | ORAL | Status: AC
  Filled 2016-12-25: qty 2

## 2016-12-25 MED ORDER — TRASTUZUMAB CHEMO 150 MG IV SOLR
450.0000 mg | Freq: Once | INTRAVENOUS | Status: AC
  Administered 2016-12-25: 450 mg via INTRAVENOUS
  Filled 2016-12-25: qty 21.43

## 2016-12-25 MED ORDER — DIPHENHYDRAMINE HCL 25 MG PO CAPS
50.0000 mg | ORAL_CAPSULE | Freq: Once | ORAL | Status: AC
  Administered 2016-12-25: 50 mg via ORAL

## 2016-12-25 MED ORDER — HEPARIN SOD (PORK) LOCK FLUSH 100 UNIT/ML IV SOLN
500.0000 [IU] | Freq: Once | INTRAVENOUS | Status: AC | PRN
  Administered 2016-12-25: 500 [IU]
  Filled 2016-12-25: qty 5

## 2016-12-25 MED ORDER — SODIUM CHLORIDE 0.9 % IV SOLN
Freq: Once | INTRAVENOUS | Status: AC
  Administered 2016-12-25: 12:00:00 via INTRAVENOUS

## 2016-12-25 MED ORDER — ACETAMINOPHEN 325 MG PO TABS
650.0000 mg | ORAL_TABLET | Freq: Once | ORAL | Status: AC
  Administered 2016-12-25: 650 mg via ORAL

## 2016-12-25 MED ORDER — DIPHENHYDRAMINE HCL 25 MG PO CAPS
ORAL_CAPSULE | ORAL | Status: AC
Start: 2016-12-25 — End: 2016-12-25
  Filled 2016-12-25: qty 2

## 2016-12-25 MED ORDER — ANASTROZOLE 1 MG PO TABS: 1.0000 mg | ORAL_TABLET | Freq: Every day | ORAL | 3 refills | Status: DC

## 2016-12-25 NOTE — Progress Notes (Signed)
Pt waited approximately 20 minutes post obs for perjeta.  Unable to stay entire 30 minutes. Pt A&O, no apparent distress.

## 2016-12-25 NOTE — Telephone Encounter (Signed)
Spoke to patient regarding upcoming April appointments.

## 2016-12-25 NOTE — Progress Notes (Signed)
Patient Care Team: Tobe Sos, MD as PCP - General (Internal Medicine) Maisie Fus, MD as Consulting Physician (Obstetrics and Gynecology) [name], MD as Consulting Physician (General Surgery) Nicholas Lose, MD as Consulting Physician (Hematology and Oncology) Kyung Rudd, MD as Consulting Physician (Radiation Oncology)  DIAGNOSIS:  Encounter Diagnosis  Name Primary?  . Malignant neoplasm of upper-outer quadrant of right breast in female, estrogen receptor positive ([place])     SUMMARY OF ONCOLOGIC HISTORY:   Malignant neoplasm of upper-outer quadrant of right female breast ([place])   02/24/2016 Initial Diagnosis    Right breast biopsy 1:00, grade 2-3 IDC, ER 5%, PR 0%, Ki-67 90%, HER-2 positive ratio 7.28, right breast biopsy 1:00 1.5 cm medial to the dominant mass fibrocystic changes; ultrasound and mammogram 02/23/2016: 2.5 cm mass at 1:00 position and a small 72m irregular mass 1.5 cm medial to the larger mass, T2 N0 stage II a clinical stage      03/04/2016 Breast MRI    2.9 cm irregular enhancing mass located within the right breast at 1:00 position, several T2 bright lesions in the right lobe of the liver incompletely visualized could be hepatic cysts      03/22/2016 - 07/05/2016 Neo-Adjuvant Chemotherapy    Neo-Adj TCHP 5 cycles followed by Herceptin and Perjeta      07/07/2016 Breast MRI    Right breast cancer: No measurable enhancement, residual 1-2 mm foci; left breast partially cystic lesion at 2:30 position 2.1 cm which is new 1 indeterminate left axillary lymph node      08/09/2016 Surgery    Right lumpectomy: 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, no residual invasive cancer, 0/4 lymph nodes negative, ER 5% week, PR negative, HER-2 positive, Ki-67 90%: Complete pathologic response ypTis ypN0      09/11/2016 - 10/09/2016 Radiation Therapy    Adj XRT       CHIEF COMPLIANT: Follow-up on Herceptin Perjeta  INTERVAL HISTORY: Brenda SOUCEKis a 61year old with  above-mentioned history of right breast cancer treated with neoadjuvant chemotherapy followed by lumpectomy and radiation. She is currently on Herceptin Perjeta maintenance. She has diarrhea which is tolerable. She is here to discuss the pros and cons of starting antiestrogen therapy.  REVIEW OF SYSTEMS:   Constitutional: Denies fevers, chills or abnormal weight loss Eyes: Denies blurriness of vision Ears, nose, mouth, throat, and face: Denies mucositis or sore throat Respiratory: Denies cough, dyspnea or wheezes Cardiovascular: Denies palpitation, chest discomfort Gastrointestinal:  Denies nausea, heartburn or change in bowel habits Skin: Denies abnormal skin rashes Lymphatics: Denies new lymphadenopathy or easy bruising Neurological:Denies numbness, tingling or new weaknesses Behavioral/Psych: Mood is stable, no new changes  Extremities: No lower extremity edema Breast:  denies any pain or lumps or nodules in either breasts All other systems were reviewed with the patient and are negative.  I have reviewed the past medical history, past surgical history, social history and family history with the patient and they are unchanged from previous note.  ALLERGIES:  is allergic to propoxyphene; inapsine [droperidol]; latex; and levaquin [levofloxacin].  MEDICATIONS:  Current Outpatient Prescriptions  Medication Sig Dispense Refill  . albuterol (PROVENTIL HFA;VENTOLIN HFA) 108 (90 Base) MCG/ACT inhaler Inhale 2 puffs into the lungs 2 (two) times daily as needed for wheezing or shortness of breath.     . ALPRAZolam (XANAX) 0.5 MG tablet Take 1 tablet (0.5 mg total) by mouth 2 (two) times daily as needed (for anxiety). 60 tablet 0  . betamethasone dipropionate (DIPROLENE) 0.05 %  cream Apply 1 application topically every Monday, Wednesday, and Friday. Applied to vagina    . Biotin 10000 MCG TABS Take by mouth.    . chlorpheniramine-HYDROcodone (TUSSIONEX PENNKINETIC ER) 10-8 MG/5ML SUER Take 5 mLs  by mouth at bedtime as needed for cough.    . Cholecalciferol (VITAMIN D3 PO) Take 2,500 Units by mouth 2 (two) times a week.     . diphenoxylate-atropine (LOMOTIL) 2.5-0.025 MG tablet Take 1 tablet by mouth 4 (four) times daily as needed for diarrhea or loose stools. 30 tablet 0  . GARLIC PO Take 1,219 mg by mouth daily.     Marland Kitchen guaiFENesin (MUCINEX) 600 MG 12 hr tablet Take 600 mg by mouth 2 (two) times daily as needed for cough or to loosen phlegm.     Marland Kitchen ibuprofen (ADVIL,MOTRIN) 800 MG tablet 800 mg every 8 (eight) hours as needed (for pain.).     Marland Kitchen lansoprazole (PREVACID) 30 MG capsule Take 30 mg by mouth daily.     Marland Kitchen levocetirizine (XYZAL) 5 MG tablet Take 5 mg by mouth every evening.    . montelukast (SINGULAIR) 10 MG tablet Take 10 mg by mouth daily.     . ondansetron (ZOFRAN) 8 MG tablet Take 1 tablet (8 mg total) by mouth 2 (two) times daily as needed for refractory nausea / vomiting. Start on day 3 after chemo. (Patient not taking: Reported on 10/02/2016) 30 tablet 1  . PREMARIN vaginal cream Place 1 Applicatorful vaginally 2 (two) times a week. Tuesday & Saturday    . spironolactone (ALDACTONE) 50 MG tablet Take 50 mg by mouth 2 (two) times daily.     . Triamcinolone Acetonide (NASACORT ALLERGY 24HR NA) Place 2 sprays into the nose daily.     . verapamil (CALAN-SR) 240 MG CR tablet Take 240 mg by mouth daily.     No current facility-administered medications for this visit.    Facility-Administered Medications Ordered in Other Visits  Medication Dose Route Frequency Provider Last Rate Last Dose  . sodium chloride flush (NS) 0.9 % injection 10 mL  10 mL Intravenous PRN Nicholas Lose, MD   10 mL at 09/27/16 1326  . sodium chloride flush (NS) 0.9 % injection 10 mL  10 mL Intracatheter PRN Nicholas Lose, MD   10 mL at 12/25/16 1049    PHYSICAL EXAMINATION: ECOG PERFORMANCE STATUS: 1 - Symptomatic but completely ambulatory  Vitals:   12/25/16 1121  BP: (!) 152/79  Pulse: 80  Resp: 18    Temp: 98.4 F (36.9 C)  SpO2: 97%   Filed Weights   12/25/16 1121  Weight: 156 lb 14.4 oz (71.2 kg)    GENERAL:alert, no distress and comfortable SKIN: skin color, texture, turgor are normal, no rashes or significant lesions EYES: normal, Conjunctiva are pink and non-injected, sclera clear OROPHARYNX:no exudate, no erythema and lips, buccal mucosa, and tongue normal  NECK: supple, thyroid normal size, non-tender, without nodularity LYMPH:  no palpable lymphadenopathy in the cervical, axillary or inguinal LUNGS: clear to auscultation and percussion with normal breathing effort HEART: regular rate & rhythm and no murmurs and no lower extremity edema ABDOMEN:abdomen soft, non-tender and normal bowel sounds MUSCULOSKELETAL:no cyanosis of digits and no clubbing  NEURO: alert & oriented x 3 with fluent speech, no focal motor/sensory deficits EXTREMITIES: No lower extremity edema  LABORATORY DATA:  I have reviewed the data as listed   Chemistry      Component Value Date/Time   NA 140 12/04/2016 0932  K 4.0 12/04/2016 0932   CL 104 03/20/2016 0927   CO2 27 12/04/2016 0932   BUN 22.8 12/04/2016 0932   CREATININE 1.2 (H) 12/04/2016 0932      Component Value Date/Time   CALCIUM 9.5 12/04/2016 0932   ALKPHOS 171 (H) 12/04/2016 0932   AST 16 12/04/2016 0932   ALT 12 12/04/2016 0932   BILITOT 0.38 12/04/2016 0932       Lab Results  Component Value Date   WBC 3.6 (L) 12/25/2016   HGB 12.4 12/25/2016   HCT 36.5 12/25/2016   MCV 96.5 12/25/2016   PLT 210 12/25/2016   NEUTROABS 2.2 12/25/2016    ASSESSMENT & PLAN:  Malignant neoplasm of upper-outer quadrant of right female breast ([place]) Right breast biopsy 1:00, grade 2-3 IDC, ER 5%, PR 0%, Ki-67 90%, HER-2 positive ratio 7.28, right breast biopsy 1:00 1.5 cm medial to the dominant mass fibrocystic changes; ultrasound and mammogram 02/23/2016: 2.5 cm mass at 1:00 position and a small 3m irregular mass 1.5 cm medial to the  larger mass, T2 N0 stage II a clinical stage  Breast MRI 03/04/2016: 2.9 cm irregular enhancing mass located within the right breast at 1:00 position, several T2 bright lesions in the right lobe of the liver incompletely visualized could be hepatic cysts.  Recommendationbased on multidisciplinary tumor board: 1. Neoadjuvant chemotherapy with TCH Perjeta 6 cycles followed by Herceptinand Perjetamaintenance for 1 year 03/22/2016- 07/05/2017 2. Breast conserving surgery if possible with sentinel lymph node study: 08/08/2016 3. Followed by adjuvant radiation therapy started 09/11/16 4. Followed by antiestrogen therapy  Right lumpectomy 08/08/2016: 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, no residual invasive cancer, 0/4 lymph nodes negative, ER 5% weak, PR negative, HER-2 positive, Ki-67 90%: Complete pathologic response ypTis ypN0 ------------------------------------------------------------------------------- Current Treatment: Herceptin and Perjeta Maintenance Diarrhea due to Perjeta  Anastrozole counseling: We discussed the risks and benefits of anti-estrogen therapy with aromatase inhibitors. These include but not limited to insomnia, hot flashes, mood changes, vaginal dryness, bone density loss, and weight gain. We strongly believe that the benefits far outweigh the risks. Patient understands these risks and consented to starting treatment. Planned treatment duration is 5 years.  Patient takes Premarin vaginal cream  RTC in 6 weeks for follow up and every 3 weeks for Herceptin and Perjeta. Patient completes her treatment 02/28/2017    I spent 25 minutes talking to the patient of which more than half was spent in counseling and coordination of care.  No orders of the defined types were placed in this encounter.  The patient has a good understanding of the overall plan. she agrees with it. she will call with any problems that may develop before the next visit here.   GRulon Eisenmenger  MD 12/25/16

## 2016-12-25 NOTE — Patient Instructions (Signed)
[facility] Discharge Instructions for Patients Receiving Chemotherapy  Today you received the following chemotherapy agents herceptin/perjeta  To help prevent nausea and vomiting after your treatment, we encourage you to take your nausea medication as directed   If you develop nausea and vomiting that is not controlled by your nausea medication, call the clinic.   BELOW ARE SYMPTOMS THAT SHOULD BE REPORTED IMMEDIATELY:  *FEVER GREATER THAN 100.5 F  *CHILLS WITH OR WITHOUT FEVER  NAUSEA AND VOMITING THAT IS NOT CONTROLLED WITH YOUR NAUSEA MEDICATION  *UNUSUAL SHORTNESS OF BREATH  *UNUSUAL BRUISING OR BLEEDING  TENDERNESS IN MOUTH AND THROAT WITH OR WITHOUT PRESENCE OF ULCERS  *URINARY PROBLEMS  *BOWEL PROBLEMS  UNUSUAL RASH Items with * indicate a potential emergency and should be followed up as soon as possible.  Feel free to call the clinic you have any questions or concerns. The clinic phone number is [phone].  

## 2016-12-25 NOTE — Assessment & Plan Note (Signed)
Right breast biopsy 1:00, grade 2-3 IDC, ER 5%, PR 0%, Ki-67 90%, HER-2 positive ratio 7.28, right breast biopsy 1:00 1.5 cm medial to the dominant mass fibrocystic changes; ultrasound and mammogram 02/23/2016: 2.5 cm mass at 1:00 position and a small 29m irregular mass 1.5 cm medial to the larger mass, T2 N0 stage II a clinical stage  Breast MRI 03/04/2016: 2.9 cm irregular enhancing mass located within the right breast at 1:00 position, several T2 bright lesions in the right lobe of the liver incompletely visualized could be hepatic cysts.  Recommendationbased on multidisciplinary tumor board: 1. Neoadjuvant chemotherapy with TCH Perjeta 6 cycles followed by Herceptinand Perjetamaintenance for 1 year 03/22/2016- 07/05/2017 2. Breast conserving surgery if possible with sentinel lymph node study: 08/08/2016 3. Followed by adjuvant radiation therapy started 09/11/16 4. Followed by antiestrogen therapy  Right lumpectomy 08/08/2016: 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, no residual invasive cancer, 0/4 lymph nodes negative, ER 5% weak, PR negative, HER-2 positive, Ki-67 90%: Complete pathologic response ypTis ypN0 ------------------------------------------------------------------------------- Current Treatment: Herceptin and Perjeta Maintenance Diarrhea due to Perjeta  Anastrozole toxicities: Patient takes Premarin vaginal cream  RTC in 6 weeks for follow up and every 3 weeks for Herceptin and Perjeta.

## 2016-12-26 ENCOUNTER — Other Ambulatory Visit: Payer: Self-pay | Admitting: General Surgery

## 2017-01-10 ENCOUNTER — Ambulatory Visit (HOSPITAL_COMMUNITY)
Admission: RE | Admit: 2017-01-10 | Discharge: 2017-01-10 | Disposition: A | Discharge status: Home / Self Care | Payer: Medicare FFS | Source: Ambulatory Visit | Attending: Cardiology | Admitting: Cardiology

## 2017-01-10 ENCOUNTER — Ambulatory Visit (HOSPITAL_COMMUNITY)
Admission: RE | Admit: 2017-01-10 | Discharge: 2017-01-10 | Disposition: A | Discharge status: Home / Self Care | Payer: Medicare FFS | Source: Ambulatory Visit | Attending: Internal Medicine | Admitting: Internal Medicine

## 2017-01-10 ENCOUNTER — Encounter (HOSPITAL_COMMUNITY): Payer: Self-pay | Admitting: Cardiology

## 2017-01-10 ENCOUNTER — Ambulatory Visit (HOSPITAL_BASED_OUTPATIENT_CLINIC_OR_DEPARTMENT_OTHER)
Admission: RE | Admit: 2017-01-10 | Discharge: 2017-01-10 | Disposition: A | Discharge status: Home / Self Care | Payer: Medicare FFS | Source: Ambulatory Visit | Attending: Cardiology | Admitting: Cardiology

## 2017-01-10 VITALS — BP 161/80 | HR 62 | Wt 154.5 lb

## 2017-01-10 DIAGNOSIS — R062 Wheezing: Secondary | ICD-10-CM | POA: Insufficient documentation

## 2017-01-10 DIAGNOSIS — Z79899 Other long term (current) drug therapy: Secondary | ICD-10-CM | POA: Insufficient documentation

## 2017-01-10 DIAGNOSIS — J42 Unspecified chronic bronchitis: Secondary | ICD-10-CM | POA: Insufficient documentation

## 2017-01-10 DIAGNOSIS — Z09 Encounter for follow-up examination after completed treatment for conditions other than malignant neoplasm: Secondary | ICD-10-CM | POA: Diagnosis present

## 2017-01-10 DIAGNOSIS — R059 Cough, unspecified: Secondary | ICD-10-CM

## 2017-01-10 DIAGNOSIS — I10 Essential (primary) hypertension: Secondary | ICD-10-CM | POA: Diagnosis not present

## 2017-01-10 DIAGNOSIS — E785 Hyperlipidemia, unspecified: Secondary | ICD-10-CM | POA: Insufficient documentation

## 2017-01-10 DIAGNOSIS — C50411 Malignant neoplasm of upper-outer quadrant of right female breast: Secondary | ICD-10-CM | POA: Diagnosis not present

## 2017-01-10 DIAGNOSIS — Z170 Estrogen receptor positive status [ER+]: Secondary | ICD-10-CM

## 2017-01-10 DIAGNOSIS — R05 Cough: Secondary | ICD-10-CM | POA: Diagnosis not present

## 2017-01-10 DIAGNOSIS — F419 Anxiety disorder, unspecified: Secondary | ICD-10-CM | POA: Diagnosis not present

## 2017-01-10 NOTE — Progress Notes (Signed)
  Echocardiogram 2D Echocardiogram has been performed.  Brenda Harding 01/10/2017, 1:57 PM

## 2017-01-10 NOTE — Patient Instructions (Signed)
Chest xray today. We will call you with results today or tomorrow.  Follow up 2 months with Dr. Aundra Dubin and echocardiogram. We will call you closer to this time, or you may call our office to schedule 1 month before you are due to be seen. Take all medication as prescribed the day of your appointment. Bring all medications with you to your appointment.  Do the following things EVERYDAY: 1) Weigh yourself in the morning before breakfast. Write it down and keep it in a log. 2) Take your medicines as prescribed 3) Eat low salt foods-Limit salt (sodium) to 2000 mg per day.  4) Stay as active as you can everyday 5) Limit all fluids for the day to less than 2 liters

## 2017-01-11 ENCOUNTER — Telehealth (HOSPITAL_COMMUNITY): Payer: Self-pay | Admitting: *Deleted

## 2017-01-11 NOTE — Telephone Encounter (Signed)
DG Chest 2 View  Order: [id number]  Status:  Final result Visible to patient:  Yes (MyChart) Dx:  Cough  Notes recorded by Darron Doom, RN on 01/11/2017 at 3:46 PM EDT Called and spoke with patient, she is aware and no further questions. ------  Notes recorded by Larey Dresser, MD on 01/10/2017 at 4:13 PM EDT Chronic bronchitis, no PNA.

## 2017-01-11 NOTE — Progress Notes (Signed)
Oncology: Dr. Lindi Adie  61 yo with history of HTN and breast cancer returns for cardio-oncology followup/echo. She has HTN controlled by verapamil CR and spironolactone.  Breast cancer was diagnosed by biopsy in 10/17.  ER+/PR-/HER2+.  Plan for [facility] Perjeta chemo x 6 cycles, then Herceptin alone to complete 1 year of treatment.  She had a lumpectomy in 4/18 and has finished radiation.    BP is high today, she says she is under a lot of stress.  Her brother-in-law died recently and her daughter is having to evacuate before the hurricane. She says that BP is usually lower.  She has been wheezing some recently and is congested.  No chest pain.  No fever.    PMH:  1. HTN 2. Hyperlipidemia: Diet-controlled.  3. Anxiety 4. Breast cancer:  Diagnosed 10/17, ER+/PR-/HER2+.  Plan for [facility] Perjeta chemo x 6 cycles, then Herceptin alone to complete 1 year of treatment. Lumpectomy 4/18.  - Echo (11/17): EF 60-65%, GLS -18.5%.  - Echo (3/18): EF 55-60%, GLS -20.8%, normal RV size and systolic function.  - Echo (6/18): EF 55-60%, GLS -19.5% - Echo (9/18): EF 55-60%, GLS -20.2%  SH: Lives in [place], never smoked, rare ETOH.   FH: Sister with CABG at age 106. HTN.   ROS: All systems reviewed and negative except as per HPI.   Current Outpatient Prescriptions  Medication Sig Dispense Refill  . albuterol (PROVENTIL HFA;VENTOLIN HFA) 108 (90 Base) MCG/ACT inhaler Inhale 2 puffs into the lungs 2 (two) times daily as needed for wheezing or shortness of breath.     . ALPRAZolam (XANAX) 0.5 MG tablet Take 1 tablet (0.5 mg total) by mouth 2 (two) times daily as needed (for anxiety). 60 tablet 0  . anastrozole (ARIMIDEX) 1 MG tablet Take 1 tablet (1 mg total) by mouth daily. 90 tablet 3  . betamethasone dipropionate (DIPROLENE) 0.05 % cream Apply 1 application topically every Monday, Wednesday, and Friday. Applied to vagina    . Biotin 10000 MCG TABS Take by mouth.    . chlorpheniramine-HYDROcodone (TUSSIONEX  PENNKINETIC ER) 10-8 MG/5ML SUER Take 5 mLs by mouth at bedtime as needed for cough.    . Cholecalciferol (VITAMIN D3 PO) Take 2,500 Units by mouth 2 (two) times a week.     . diphenoxylate-atropine (LOMOTIL) 2.5-0.025 MG tablet Take 1 tablet by mouth 4 (four) times daily as needed for diarrhea or loose stools. 30 tablet 0  . GARLIC PO Take 7,169 mg by mouth daily.     Marland Kitchen guaiFENesin (MUCINEX) 600 MG 12 hr tablet Take 600 mg by mouth 2 (two) times daily as needed for cough or to loosen phlegm.     Marland Kitchen ibuprofen (ADVIL,MOTRIN) 800 MG tablet 800 mg every 8 (eight) hours as needed (for pain.).     Marland Kitchen lansoprazole (PREVACID) 30 MG capsule Take 30 mg by mouth daily.     Marland Kitchen levocetirizine (XYZAL) 5 MG tablet Take 5 mg by mouth every evening.    . montelukast (SINGULAIR) 10 MG tablet Take 10 mg by mouth daily.     . ondansetron (ZOFRAN) 8 MG tablet Take 1 tablet (8 mg total) by mouth 2 (two) times daily as needed for refractory nausea / vomiting. Start on day 3 after chemo. 30 tablet 1  . PREMARIN vaginal cream Place 1 Applicatorful vaginally 2 (two) times a week. Tuesday & Saturday    . spironolactone (ALDACTONE) 50 MG tablet Take 50 mg by mouth 2 (two) times daily.     Marland Kitchen  Triamcinolone Acetonide (NASACORT ALLERGY 24HR NA) Place 2 sprays into the nose daily.     . verapamil (CALAN-SR) 240 MG CR tablet Take 240 mg by mouth daily.     No current facility-administered medications for this encounter.    Facility-Administered Medications Ordered in Other Encounters  Medication Dose Route Frequency Provider Last Rate Last Dose  . sodium chloride flush (NS) 0.9 % injection 10 mL  10 mL Intravenous PRN Nicholas Lose, MD   10 mL at 09/27/16 1326  . sodium chloride flush (NS) 0.9 % injection 10 mL  10 mL Intracatheter PRN Nicholas Lose, MD   10 mL at 12/25/16 1450   BP (!) 161/80 (BP Location: Left Arm, Patient Position: Sitting, Cuff Size: Normal)   Pulse 62   Wt 154 lb 8 oz (70.1 kg)   SpO2 98%   BMI 25.71  kg/m  General: NAD Neck: No JVD, no thyromegaly or thyroid nodule.  Lungs: Bilateral wheezes. CV: Nondisplaced PMI.  Heart regular S1/S2, no S3/S4, no murmur.  No peripheral edema.  No carotid bruit.  Normal pedal pulses.  Abdomen: Soft, nontender, no hepatosplenomegaly, no distention.  Skin: Intact without lesions or rashes.  Neurologic: Alert and oriented x 3.  Psych: Normal affect. Extremities: No clubbing or cyanosis.  HEENT: Normal.    Assessment/Plan:  1. HTN: BP elevated today but says she is under stress and that it is generally controlled.  Will continue to follow, if it remains high in the future will need an additional agent.  2. Breast cancer: Patient is getting Herceptin-based therapy for her breast cancer.  Herceptin will continue 1 year.  I reviewed today's echo: EF and strain pattern remain stable.  - Repeat echo in 3 months with office visit.  3. Congestion/wheezing: She is wheezy on exam.  I will get a CXR to rule out PNA.   Loralie Champagne 01/11/2017

## 2017-01-12 ENCOUNTER — Encounter: Payer: Self-pay | Admitting: Pharmacist

## 2017-01-12 DIAGNOSIS — C50411 Malignant neoplasm of upper-outer quadrant of right female breast: Secondary | ICD-10-CM

## 2017-01-12 DIAGNOSIS — Z170 Estrogen receptor positive status [ER+]: Secondary | ICD-10-CM

## 2017-01-12 NOTE — Progress Notes (Signed)
Telephone documentation  Study code: [id number]  Spoke with patient over the phone today and informed her that her buccal swab for "Pharmacogenetic analysis of toxicities related to administration of taxanes in breast cancer patients" study did NOT contain enough DNA to yield results. Provide my office number to the patient if she had additional questions.  Amed Datta N. Debbra Digiulio, PharmD, BCPS Hematology/Oncology Clinical Pharmacist [facility] [phone]    

## 2017-01-17 ENCOUNTER — Ambulatory Visit (HOSPITAL_BASED_OUTPATIENT_CLINIC_OR_DEPARTMENT_OTHER): Payer: Medicare FFS

## 2017-01-17 DIAGNOSIS — C50411 Malignant neoplasm of upper-outer quadrant of right female breast: Secondary | ICD-10-CM | POA: Diagnosis not present

## 2017-01-17 DIAGNOSIS — Z5112 Encounter for antineoplastic immunotherapy: Secondary | ICD-10-CM

## 2017-01-17 MED ORDER — SODIUM CHLORIDE 0.9 % IV SOLN
Freq: Once | INTRAVENOUS | Status: AC
  Administered 2017-01-17: 10:00:00 via INTRAVENOUS

## 2017-01-17 MED ORDER — DIPHENHYDRAMINE HCL 25 MG PO CAPS
ORAL_CAPSULE | ORAL | Status: AC
  Filled 2017-01-17: qty 2

## 2017-01-17 MED ORDER — DIPHENHYDRAMINE HCL 25 MG PO CAPS
50.0000 mg | ORAL_CAPSULE | Freq: Once | ORAL | Status: AC
  Administered 2017-01-17: 50 mg via ORAL

## 2017-01-17 MED ORDER — ACETAMINOPHEN 325 MG PO TABS
ORAL_TABLET | ORAL | Status: AC
  Filled 2017-01-17: qty 2

## 2017-01-17 MED ORDER — SODIUM CHLORIDE 0.9 % IV SOLN
420.0000 mg | Freq: Once | INTRAVENOUS | Status: AC
  Administered 2017-01-17: 420 mg via INTRAVENOUS
  Filled 2017-01-17: qty 14

## 2017-01-17 MED ORDER — HEPARIN SOD (PORK) LOCK FLUSH 100 UNIT/ML IV SOLN
500.0000 [IU] | Freq: Once | INTRAVENOUS | Status: AC | PRN
  Administered 2017-01-17: 500 [IU]
  Filled 2017-01-17: qty 5

## 2017-01-17 MED ORDER — ACETAMINOPHEN 325 MG PO TABS
650.0000 mg | ORAL_TABLET | Freq: Once | ORAL | Status: AC
  Administered 2017-01-17: 650 mg via ORAL

## 2017-01-17 MED ORDER — SODIUM CHLORIDE 0.9% FLUSH
10.0000 mL | INTRAVENOUS | Status: DC | PRN
  Administered 2017-01-17: 10 mL
  Filled 2017-01-17: qty 10

## 2017-01-17 MED ORDER — TRASTUZUMAB CHEMO 150 MG IV SOLR
450.0000 mg | Freq: Once | INTRAVENOUS | Status: AC
  Administered 2017-01-17: 450 mg via INTRAVENOUS
  Filled 2017-01-17: qty 21.43

## 2017-01-17 NOTE — Patient Instructions (Signed)
[facility] Discharge Instructions for Patients Receiving Chemotherapy  Today you received the following chemotherapy agents:  Herceptin, Perjeta  To help prevent nausea and vomiting after your treatment, we encourage you to take your nausea medication as prescribed.   If you develop nausea and vomiting that is not controlled by your nausea medication, call the clinic.   BELOW ARE SYMPTOMS THAT SHOULD BE REPORTED IMMEDIATELY:  *FEVER GREATER THAN 100.5 F  *CHILLS WITH OR WITHOUT FEVER  NAUSEA AND VOMITING THAT IS NOT CONTROLLED WITH YOUR NAUSEA MEDICATION  *UNUSUAL SHORTNESS OF BREATH  *UNUSUAL BRUISING OR BLEEDING  TENDERNESS IN MOUTH AND THROAT WITH OR WITHOUT PRESENCE OF ULCERS  *URINARY PROBLEMS  *BOWEL PROBLEMS  UNUSUAL RASH Items with * indicate a potential emergency and should be followed up as soon as possible.  Feel free to call the clinic you have any questions or concerns. The clinic phone number is [phone].  Please show the CHEMO ALERT CARD at check-in to the Emergency Department and triage nurse.   

## 2017-01-17 NOTE — Progress Notes (Signed)
Patient reports "dealing with bronchitis" since approx. 09/03. She stated that her primary care physician is aware and prescribed her steroids. Per the patient, her symptoms have improved, but she still has cough with sputum. Patient also reports "fluid in ear". Called desk RN, May to notify of symptoms. Instructed to refer patient back to her PCP. Patient voiced understanding.   Wylene Simmer, BSN, RN 01/17/2017 10:17 AM   Patient refused to stay for full 30 minute observation

## 2017-02-07 ENCOUNTER — Ambulatory Visit: Payer: Medicare FFS

## 2017-02-07 ENCOUNTER — Other Ambulatory Visit (HOSPITAL_BASED_OUTPATIENT_CLINIC_OR_DEPARTMENT_OTHER): Payer: Medicare FFS

## 2017-02-07 ENCOUNTER — Ambulatory Visit (HOSPITAL_BASED_OUTPATIENT_CLINIC_OR_DEPARTMENT_OTHER): Payer: Medicare FFS

## 2017-02-07 ENCOUNTER — Encounter: Payer: Self-pay | Admitting: *Deleted

## 2017-02-07 ENCOUNTER — Ambulatory Visit (HOSPITAL_BASED_OUTPATIENT_CLINIC_OR_DEPARTMENT_OTHER): Payer: Medicare FFS | Admitting: Hematology and Oncology

## 2017-02-07 DIAGNOSIS — K521 Toxic gastroenteritis and colitis: Secondary | ICD-10-CM | POA: Diagnosis not present

## 2017-02-07 DIAGNOSIS — Z170 Estrogen receptor positive status [ER+]: Secondary | ICD-10-CM

## 2017-02-07 DIAGNOSIS — C50411 Malignant neoplasm of upper-outer quadrant of right female breast: Secondary | ICD-10-CM

## 2017-02-07 DIAGNOSIS — Z5112 Encounter for antineoplastic immunotherapy: Secondary | ICD-10-CM

## 2017-02-07 DIAGNOSIS — Z95828 Presence of other vascular implants and grafts: Secondary | ICD-10-CM

## 2017-02-07 DIAGNOSIS — K769 Liver disease, unspecified: Secondary | ICD-10-CM

## 2017-02-07 LAB — CBC WITH DIFFERENTIAL/PLATELET
BASO%: 0.5 % (ref 0.0–2.0)
Basophils Absolute: 0 10*3/uL (ref 0.0–0.1)
EOS%: 0.8 % (ref 0.0–7.0)
Eosinophils Absolute: 0 10*3/uL (ref 0.0–0.5)
HCT: 38.7 % (ref 34.8–46.6)
HGB: 12.7 g/dL (ref 11.6–15.9)
LYMPH%: 27.7 % (ref 14.0–49.7)
MCH: 32.1 pg (ref 25.1–34.0)
MCHC: 32.8 g/dL (ref 31.5–36.0)
MCV: 97.7 fL (ref 79.5–101.0)
MONO#: 0.4 10*3/uL (ref 0.1–0.9)
MONO%: 9.8 % (ref 0.0–14.0)
NEUT#: 2.3 10*3/uL (ref 1.5–6.5)
NEUT%: 61.2 % (ref 38.4–76.8)
Platelets: 190 10*3/uL (ref 145–400)
RBC: 3.96 10*6/uL (ref 3.70–5.45)
RDW: 13 % (ref 11.2–14.5)
WBC: 3.8 10*3/uL — ABNORMAL LOW (ref 3.9–10.3)
lymph#: 1 10*3/uL (ref 0.9–3.3)

## 2017-02-07 LAB — COMPREHENSIVE METABOLIC PANEL
ALT: 12 U/L (ref 0–55)
AST: 15 U/L (ref 5–34)
Albumin: 4.1 g/dL (ref 3.5–5.0)
Alkaline Phosphatase: 177 U/L — ABNORMAL HIGH (ref 40–150)
Anion Gap: 9 mEq/L (ref 3–11)
BUN: 26.6 mg/dL — ABNORMAL HIGH (ref 7.0–26.0)
CO2: 25 mEq/L (ref 22–29)
Calcium: 9.7 mg/dL (ref 8.4–10.4)
Chloride: 106 mEq/L (ref 98–109)
Creatinine: 1.2 mg/dL — ABNORMAL HIGH (ref 0.6–1.1)
EGFR: 50 mL/min/{1.73_m2} — ABNORMAL LOW (ref >60)
Glucose: 103 mg/dl (ref 70–140)
Potassium: 4.2 mEq/L (ref 3.5–5.1)
Sodium: 139 mEq/L (ref 136–145)
Total Bilirubin: 0.54 mg/dL (ref 0.20–1.20)
Total Protein: 7.2 g/dL (ref 6.4–8.3)

## 2017-02-07 MED ORDER — HEPARIN SOD (PORK) LOCK FLUSH 100 UNIT/ML IV SOLN
500.0000 [IU] | Freq: Once | INTRAVENOUS | Status: AC | PRN
  Administered 2017-02-07: 500 [IU]
  Filled 2017-02-07: qty 5

## 2017-02-07 MED ORDER — DIPHENOXYLATE-ATROPINE 2.5-0.025 MG PO TABS: 1.0000 | ORAL_TABLET | Freq: Four times a day (QID) | ORAL | 0 refills | Status: DC | PRN

## 2017-02-07 MED ORDER — ACETAMINOPHEN 325 MG PO TABS
650.0000 mg | ORAL_TABLET | Freq: Once | ORAL | Status: AC
  Administered 2017-02-07: 650 mg via ORAL

## 2017-02-07 MED ORDER — SODIUM CHLORIDE 0.9% FLUSH
10.0000 mL | INTRAVENOUS | Status: DC | PRN
  Filled 2017-02-07: qty 10

## 2017-02-07 MED ORDER — DIPHENHYDRAMINE HCL 25 MG PO CAPS
50.0000 mg | ORAL_CAPSULE | Freq: Once | ORAL | Status: AC
  Administered 2017-02-07: 50 mg via ORAL

## 2017-02-07 MED ORDER — ACETAMINOPHEN 325 MG PO TABS
ORAL_TABLET | ORAL | Status: AC
  Filled 2017-02-07: qty 2

## 2017-02-07 MED ORDER — DIPHENHYDRAMINE HCL 25 MG PO CAPS
ORAL_CAPSULE | ORAL | Status: AC
  Filled 2017-02-07: qty 2

## 2017-02-07 MED ORDER — TRASTUZUMAB CHEMO 150 MG IV SOLR
450.0000 mg | Freq: Once | INTRAVENOUS | Status: AC
  Administered 2017-02-07: 450 mg via INTRAVENOUS
  Filled 2017-02-07: qty 21.43

## 2017-02-07 MED ORDER — SODIUM CHLORIDE 0.9% FLUSH
10.0000 mL | INTRAVENOUS | Status: DC | PRN
Start: 2017-02-07 — End: 2017-02-07
  Administered 2017-02-07: 10 mL via INTRAVENOUS
  Filled 2017-02-07: qty 10

## 2017-02-07 MED ORDER — SODIUM CHLORIDE 0.9 % IV SOLN
Freq: Once | INTRAVENOUS | Status: AC
  Administered 2017-02-07: 14:00:00 via INTRAVENOUS

## 2017-02-07 MED ORDER — SODIUM CHLORIDE 0.9 % IV SOLN
420.0000 mg | Freq: Once | INTRAVENOUS | Status: AC
  Administered 2017-02-07: 420 mg via INTRAVENOUS
  Filled 2017-02-07: qty 14

## 2017-02-07 NOTE — Assessment & Plan Note (Signed)
Right breast biopsy 1:00, grade 2-3 IDC, ER 5%, PR 0%, Ki-67 90%, HER-2 positive ratio 7.28, right breast biopsy 1:00 1.5 cm medial to the dominant mass fibrocystic changes; ultrasound and mammogram 02/23/2016: 2.5 cm mass at 1:00 position and a small 40m irregular mass 1.5 cm medial to the larger mass, T2 N0 stage II a clinical stage  Breast MRI 03/04/2016: 2.9 cm irregular enhancing mass located within the right breast at 1:00 position, several T2 bright lesions in the right lobe of the liver incompletely visualized could be hepatic cysts.  Recommendationbased on multidisciplinary tumor board: 1. Neoadjuvant chemotherapy with TCH Perjeta 6 cycles followed by Herceptinand Perjetamaintenance for 1 year 03/22/2016- 07/05/2017 2. Breast conserving surgery if possible with sentinel lymph node study: 08/08/2016 3. Followed by adjuvant radiation therapy 09/11/16- 10/09/16 4. Followed by antiestrogen therapy started July 2018  Right lumpectomy 08/08/2016: 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, no residual invasive cancer, 0/4 lymph nodes negative, ER 5% weak, PR negative, HER-2 positive, Ki-67 90%: Complete pathologic response ypTis ypN0 ------------------------------------------------------------------------------- Current Treatment: Herceptin and Perjeta Maintenance Diarrhea due to Perjeta Patient will complete Herceptin and Perjeta in 3 weeks.  Anastrozole toxicities: Patient takes Premarin vaginal cream Mild fatigue Occasional hot flash  RTC in April 2019 for follow-up on antiestrogen therapy. She has appointments scheduled to have her port removed

## 2017-02-07 NOTE — Patient Instructions (Signed)
[facility] Center Discharge Instructions for Patients Receiving Chemotherapy  Today you received the following chemotherapy agents:  Herceptin, Perjeta  To help prevent nausea and vomiting after your treatment, we encourage you to take your nausea medication as prescribed.   If you develop nausea and vomiting that is not controlled by your nausea medication, call the clinic.   BELOW ARE SYMPTOMS THAT SHOULD BE REPORTED IMMEDIATELY:  *FEVER GREATER THAN 100.5 F  *CHILLS WITH OR WITHOUT FEVER  NAUSEA AND VOMITING THAT IS NOT CONTROLLED WITH YOUR NAUSEA MEDICATION  *UNUSUAL SHORTNESS OF BREATH  *UNUSUAL BRUISING OR BLEEDING  TENDERNESS IN MOUTH AND THROAT WITH OR WITHOUT PRESENCE OF ULCERS  *URINARY PROBLEMS  *BOWEL PROBLEMS  UNUSUAL RASH Items with * indicate a potential emergency and should be followed up as soon as possible.  Feel free to call the clinic you have any questions or concerns. The clinic phone number is [phone].  Please show the CHEMO ALERT CARD at check-in to the Emergency Department and triage nurse.   

## 2017-02-07 NOTE — Progress Notes (Signed)
Patient Care Team: Tobe Sos, MD as PCP - General (Internal Medicine) Maisie Fus, MD as Consulting Physician (Obstetrics and Gynecology) [name], MD as Consulting Physician (General Surgery) Nicholas Lose, MD as Consulting Physician (Hematology and Oncology) Kyung Rudd, MD as Consulting Physician (Radiation Oncology)  DIAGNOSIS:  Encounter Diagnosis  Name Primary?  . Malignant neoplasm of upper-outer quadrant of right female breast, unspecified estrogen receptor status ([place])     SUMMARY OF ONCOLOGIC HISTORY:   Malignant neoplasm of upper-outer quadrant of right female breast ([place])   02/24/2016 Initial Diagnosis    Right breast biopsy 1:00, grade 2-3 IDC, ER 5%, PR 0%, Ki-67 90%, HER-2 positive ratio 7.28, right breast biopsy 1:00 1.5 cm medial to the dominant mass fibrocystic changes; ultrasound and mammogram 02/23/2016: 2.5 cm mass at 1:00 position and a small 47m irregular mass 1.5 cm medial to the larger mass, T2 N0 stage II a clinical stage      03/04/2016 Breast MRI    2.9 cm irregular enhancing mass located within the right breast at 1:00 position, several T2 bright lesions in the right lobe of the liver incompletely visualized could be hepatic cysts      03/22/2016 - 07/05/2016 Neo-Adjuvant Chemotherapy    Neo-Adj TCHP 5 cycles followed by Herceptin and Perjeta      07/07/2016 Breast MRI    Right breast cancer: No measurable enhancement, residual 1-2 mm foci; left breast partially cystic lesion at 2:30 position 2.1 cm which is new 1 indeterminate left axillary lymph node      08/09/2016 Surgery    Right lumpectomy: 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, no residual invasive cancer, 0/4 lymph nodes negative, ER 5% week, PR negative, HER-2 positive, Ki-67 90%: Complete pathologic response ypTis ypN0      09/11/2016 - 10/09/2016 Radiation Therapy    Adj XRT      11/07/2016 -  Anti-estrogen oral therapy    Anastrozole 1 mg daily       CHIEF COMPLIANT: Follow-up on  antiestrogen therapy  INTERVAL HISTORY: Brenda HORINEis a 61year old with above-mentioned history of right breast cancer treated with neoadjuvant chemotherapy followed by lumpectomy radiation and is currently on anastrozole along with Herceptin Perjeta maintenance. She has one more dose of maintenance therapy. She reports intermittent diarrhea but otherwise doing well. She is also tolerating the anastrozole fairly well. She did not start it until couple weeks ago because her mother and brother-in-law had passed away recently.  REVIEW OF SYSTEMS:   Constitutional: Denies fevers, chills or abnormal weight loss Eyes: Denies blurriness of vision Ears, nose, mouth, throat, and face: Denies mucositis or sore throat Respiratory: Denies cough, dyspnea or wheezes Cardiovascular: Denies palpitation, chest discomfort Gastrointestinal:  Denies nausea, heartburn or change in bowel habits Skin: Denies abnormal skin rashes Lymphatics: Denies new lymphadenopathy or easy bruising Neurological:Denies numbness, tingling or new weaknesses Behavioral/Psych: Mood is stable, no new changes  Extremities: No lower extremity edema  All other systems were reviewed with the patient and are negative.  I have reviewed the past medical history, past surgical history, social history and family history with the patient and they are unchanged from previous note.  ALLERGIES:  is allergic to propoxyphene; inapsine [droperidol]; latex; and levaquin [levofloxacin].  MEDICATIONS:  Current Outpatient Prescriptions  Medication Sig Dispense Refill  . albuterol (PROVENTIL HFA;VENTOLIN HFA) 108 (90 Base) MCG/ACT inhaler Inhale 2 puffs into the lungs 2 (two) times daily as needed for wheezing or shortness of breath.     .Marland Kitchen  ALPRAZolam (XANAX) 0.5 MG tablet Take 1 tablet (0.5 mg total) by mouth 2 (two) times daily as needed (for anxiety). 60 tablet 0  . anastrozole (ARIMIDEX) 1 MG tablet Take 1 tablet (1 mg total) by mouth daily. 90  tablet 3  . betamethasone dipropionate (DIPROLENE) 0.05 % cream Apply 1 application topically every Monday, Wednesday, and Friday. Applied to vagina    . Biotin 10000 MCG TABS Take by mouth.    . chlorpheniramine-HYDROcodone (TUSSIONEX PENNKINETIC ER) 10-8 MG/5ML SUER Take 5 mLs by mouth at bedtime as needed for cough.    . Cholecalciferol (VITAMIN D3 PO) Take 2,500 Units by mouth 2 (two) times a week.     . diphenoxylate-atropine (LOMOTIL) 2.5-0.025 MG tablet Take 1 tablet by mouth 4 (four) times daily as needed for diarrhea or loose stools. 30 tablet 0  . GARLIC PO Take 4,098 mg by mouth daily.     Marland Kitchen guaiFENesin (MUCINEX) 600 MG 12 hr tablet Take 600 mg by mouth 2 (two) times daily as needed for cough or to loosen phlegm.     Marland Kitchen ibuprofen (ADVIL,MOTRIN) 800 MG tablet 800 mg every 8 (eight) hours as needed (for pain.).     Marland Kitchen lansoprazole (PREVACID) 30 MG capsule Take 30 mg by mouth daily.     Marland Kitchen levocetirizine (XYZAL) 5 MG tablet Take 5 mg by mouth every evening.    . montelukast (SINGULAIR) 10 MG tablet Take 10 mg by mouth daily.     . ondansetron (ZOFRAN) 8 MG tablet Take 1 tablet (8 mg total) by mouth 2 (two) times daily as needed for refractory nausea / vomiting. Start on day 3 after chemo. 30 tablet 1  . PREMARIN vaginal cream Place 1 Applicatorful vaginally 2 (two) times a week. Tuesday & Saturday    . spironolactone (ALDACTONE) 50 MG tablet Take 50 mg by mouth 2 (two) times daily.     . Triamcinolone Acetonide (NASACORT ALLERGY 24HR NA) Place 2 sprays into the nose daily.     . verapamil (CALAN-SR) 240 MG CR tablet Take 240 mg by mouth daily.     No current facility-administered medications for this visit.    Facility-Administered Medications Ordered in Other Visits  Medication Dose Route Frequency Provider Last Rate Last Dose  . heparin lock flush 100 unit/mL  500 Units Intracatheter Once PRN Nicholas Lose, MD      . pertuzumab (PERJETA) 420 mg in sodium chloride 0.9 % 250 mL chemo  infusion  420 mg Intravenous Once Nicholas Lose, MD      . sodium chloride flush (NS) 0.9 % injection 10 mL  10 mL Intravenous PRN Nicholas Lose, MD   10 mL at 09/27/16 1326  . sodium chloride flush (NS) 0.9 % injection 10 mL  10 mL Intracatheter PRN Nicholas Lose, MD   10 mL at 12/25/16 1450  . sodium chloride flush (NS) 0.9 % injection 10 mL  10 mL Intracatheter PRN Nicholas Lose, MD      . trastuzumab (HERCEPTIN) 450 mg in sodium chloride 0.9 % 250 mL chemo infusion  450 mg Intravenous Once Nicholas Lose, MD        PHYSICAL EXAMINATION: ECOG PERFORMANCE STATUS: 1 - Symptomatic but completely ambulatory  Vitals:   02/07/17 1100  BP: 137/73  Pulse: 77  Resp: 18  Temp: 98 F (36.7 C)  SpO2: 99%   Filed Weights   02/07/17 1100  Weight: 157 lb 3.2 oz (71.3 kg)    GENERAL:alert, no distress and  comfortable SKIN: skin color, texture, turgor are normal, no rashes or significant lesions EYES: normal, Conjunctiva are pink and non-injected, sclera clear OROPHARYNX:no exudate, no erythema and lips, buccal mucosa, and tongue normal  NECK: supple, thyroid normal size, non-tender, without nodularity LYMPH:  no palpable lymphadenopathy in the cervical, axillary or inguinal LUNGS: clear to auscultation and percussion with normal breathing effort HEART: regular rate & rhythm and no murmurs and no lower extremity edema ABDOMEN:abdomen soft, non-tender and normal bowel sounds MUSCULOSKELETAL:no cyanosis of digits and no clubbing  NEURO: alert & oriented x 3 with fluent speech, no focal motor/sensory deficits EXTREMITIES: No lower extremity edema  LABORATORY DATA:  I have reviewed the data as listed   Chemistry      Component Value Date/Time   NA 139 02/07/2017 1042   K 4.2 02/07/2017 1042   CL 104 03/20/2016 0927   CO2 25 02/07/2017 1042   BUN 26.6 (H) 02/07/2017 1042   CREATININE 1.2 (H) 02/07/2017 1042      Component Value Date/Time   CALCIUM 9.7 02/07/2017 1042   ALKPHOS 177 (H)  02/07/2017 1042   AST 15 02/07/2017 1042   ALT 12 02/07/2017 1042   BILITOT 0.54 02/07/2017 1042       Lab Results  Component Value Date   WBC 3.8 (L) 02/07/2017   HGB 12.7 02/07/2017   HCT 38.7 02/07/2017   MCV 97.7 02/07/2017   PLT 190 02/07/2017   NEUTROABS 2.3 02/07/2017    ASSESSMENT & PLAN:  Malignant neoplasm of upper-outer quadrant of right female breast (HCC) Right breast biopsy 1:00, grade 2-3 IDC, ER 5%, PR 0%, Ki-67 90%, HER-2 positive ratio 7.28, right breast biopsy 1:00 1.5 cm medial to the dominant mass fibrocystic changes; ultrasound and mammogram 02/23/2016: 2.5 cm mass at 1:00 position and a small 17m irregular mass 1.5 cm medial to the larger mass, T2 N0 stage II a clinical stage  Breast MRI 03/04/2016: 2.9 cm irregular enhancing mass located within the right breast at 1:00 position, several T2 bright lesions in the right lobe of the liver incompletely visualized could be hepatic cysts.  Recommendationbased on multidisciplinary tumor board: 1. Neoadjuvant chemotherapy with TCH Perjeta 6 cycles followed by Herceptinand Perjetamaintenance for 1 year 03/22/2016- 07/05/2017 2. Breast conserving surgery if possible with sentinel lymph node study: 08/08/2016 3. Followed by adjuvant radiation therapy 09/11/16- 10/09/16 4. Followed by antiestrogen therapy started July 2018  Right lumpectomy 08/08/2016: 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, no residual invasive cancer, 0/4 lymph nodes negative, ER 5% weak, PR negative, HER-2 positive, Ki-67 90%: Complete pathologic response ypTis ypN0 ------------------------------------------------------------------------------- Current Treatment: Herceptin and Perjeta Maintenance Diarrhea due to Perjeta Patient will complete Herceptin and Perjeta in 3 weeks.  Anastrozole toxicities: Patient takes Premarin vaginal cream Mild fatigue Occasional hot flash  RTC in April 2019 for follow-up on antiestrogen therapy. She has appointments scheduled  to have her port removed   I spent 25 minutes talking to the patient of which more than half was spent in counseling and coordination of care.  No orders of the defined types were placed in this encounter.  The patient has a good understanding of the overall plan. she agrees with it. she will call with any problems that may develop before the next visit here.   GRulon Eisenmenger MD 02/07/17

## 2017-02-07 NOTE — Progress Notes (Signed)
Patient declines the 30 minute observation period today post perjeta.  Pt tolerated infusion well today and has had no problems with her infusion in the past.  Pt left chemo infusion area stable and ambulatory with her husband.

## 2017-02-08 ENCOUNTER — Telehealth: Payer: Self-pay | Admitting: Hematology and Oncology

## 2017-02-08 NOTE — Telephone Encounter (Signed)
Per 10/10 no los at check out °

## 2017-02-09 ENCOUNTER — Telehealth: Payer: Self-pay

## 2017-02-09 NOTE — Telephone Encounter (Signed)
Pt called to verify her pharmacy is [facility] in [place] for her lomotil Rx.

## 2017-02-28 ENCOUNTER — Encounter: Payer: Self-pay | Admitting: *Deleted

## 2017-02-28 ENCOUNTER — Ambulatory Visit (HOSPITAL_BASED_OUTPATIENT_CLINIC_OR_DEPARTMENT_OTHER): Payer: Medicare FFS

## 2017-02-28 DIAGNOSIS — Z5112 Encounter for antineoplastic immunotherapy: Secondary | ICD-10-CM

## 2017-02-28 DIAGNOSIS — C50411 Malignant neoplasm of upper-outer quadrant of right female breast: Secondary | ICD-10-CM | POA: Diagnosis not present

## 2017-02-28 MED ORDER — SODIUM CHLORIDE 0.9 % IV SOLN
450.0000 mg | Freq: Once | INTRAVENOUS | Status: AC
  Administered 2017-02-28: 450 mg via INTRAVENOUS
  Filled 2017-02-28: qty 21.43

## 2017-02-28 MED ORDER — SODIUM CHLORIDE 0.9% FLUSH
10.0000 mL | INTRAVENOUS | Status: DC | PRN
  Administered 2017-02-28: 10 mL
  Filled 2017-02-28: qty 10

## 2017-02-28 MED ORDER — ACETAMINOPHEN 325 MG PO TABS
ORAL_TABLET | ORAL | Status: AC
  Filled 2017-02-28: qty 2

## 2017-02-28 MED ORDER — SODIUM CHLORIDE 0.9 % IV SOLN
420.0000 mg | Freq: Once | INTRAVENOUS | Status: AC
  Administered 2017-02-28: 420 mg via INTRAVENOUS
  Filled 2017-02-28: qty 14

## 2017-02-28 MED ORDER — SODIUM CHLORIDE 0.9 % IV SOLN
Freq: Once | INTRAVENOUS | Status: AC
  Administered 2017-02-28: 10:00:00 via INTRAVENOUS

## 2017-02-28 MED ORDER — DIPHENHYDRAMINE HCL 25 MG PO CAPS
ORAL_CAPSULE | ORAL | Status: AC
  Filled 2017-02-28: qty 2

## 2017-02-28 MED ORDER — DIPHENHYDRAMINE HCL 25 MG PO CAPS
50.0000 mg | ORAL_CAPSULE | Freq: Once | ORAL | Status: AC
  Administered 2017-02-28: 50 mg via ORAL

## 2017-02-28 MED ORDER — ACETAMINOPHEN 325 MG PO TABS
650.0000 mg | ORAL_TABLET | Freq: Once | ORAL | Status: AC
  Administered 2017-02-28: 650 mg via ORAL

## 2017-02-28 MED ORDER — HEPARIN SOD (PORK) LOCK FLUSH 100 UNIT/ML IV SOLN
500.0000 [IU] | Freq: Once | INTRAVENOUS | Status: AC | PRN
  Administered 2017-02-28: 500 [IU]
  Filled 2017-02-28: qty 5

## 2017-03-02 ENCOUNTER — Encounter (HOSPITAL_COMMUNITY): Payer: Self-pay

## 2017-03-02 ENCOUNTER — Encounter (HOSPITAL_COMMUNITY)
Admission: RE | Admit: 2017-03-02 | Discharge: 2017-03-02 | Disposition: A | Discharge status: Home / Self Care | Payer: Medicare FFS | Source: Ambulatory Visit | Attending: General Surgery | Admitting: General Surgery

## 2017-03-02 DIAGNOSIS — C50411 Malignant neoplasm of upper-outer quadrant of right female breast: Secondary | ICD-10-CM | POA: Diagnosis not present

## 2017-03-02 DIAGNOSIS — Z01812 Encounter for preprocedural laboratory examination: Secondary | ICD-10-CM | POA: Diagnosis not present

## 2017-03-02 HISTORY — DX: Unspecified cataract: H26.9

## 2017-03-02 HISTORY — DX: Scoliosis, unspecified: M41.9

## 2017-03-02 LAB — BASIC METABOLIC PANEL
Anion gap: 8 (ref 5–15)
BUN: 19 mg/dL (ref 6–20)
CO2: 26 mmol/L (ref 22–32)
Calcium: 9.4 mg/dL (ref 8.9–10.3)
Chloride: 105 mmol/L (ref 101–111)
Creatinine, Ser: 1.15 mg/dL — ABNORMAL HIGH (ref 0.44–1.00)
GFR calc Af Amer: 58 mL/min — ABNORMAL LOW (ref >60)
GFR calc non Af Amer: 50 mL/min — ABNORMAL LOW (ref >60)
Glucose, Bld: 84 mg/dL (ref 65–99)
Potassium: 4 mmol/L (ref 3.5–5.1)
Sodium: 139 mmol/L (ref 135–145)

## 2017-03-02 LAB — CBC
HCT: 39.3 % (ref 36.0–46.0)
Hemoglobin: 12.6 g/dL (ref 12.0–15.0)
MCH: 31.4 pg (ref 26.0–34.0)
MCHC: 32.1 g/dL (ref 30.0–36.0)
MCV: 98 fL (ref 78.0–100.0)
Platelets: 192 10*3/uL (ref 150–400)
RBC: 4.01 MIL/uL (ref 3.87–5.11)
RDW: 13.3 % (ref 11.5–15.5)
WBC: 3.8 10*3/uL — ABNORMAL LOW (ref 4.0–10.5)

## 2017-03-02 NOTE — Pre-Procedure Instructions (Signed)
    Brenda Harding  03/02/2017      [facility] [name], [place] [address] [place] [address] Phone: [phone] Fax: [phone]    Your procedure is scheduled on Thursday, March 08, 2017  Report to [facility] Admitting at 7:00 A.M.  Call this number if you have problems the morning of surgery:  [phone]   Remember: Drink Boost Breeze by 5:00A.M. on the morning of surgery ( 2 hours prior to arrival)  Do not eat food or drink liquids after midnight Wednesday, March 07, 2017  Take these medicines the morning of surgery with A SIP OF WATER : lansoprazole (PREVACID),  montelukast (SINGULAIR), verapamil (CALAN-SR), Triamcinolone Acetonide (NASACORT ALLERGY) nasal spray, BREO ELLIPTA INHALER, if needed: acetaminophen (TYLENOL) for pain, ALPRAZolam (XANAX) for anxiety,  albuterol (PROVENTIL HFA;VENTOLIN HFA) inhaler for wheezing or shortness of breath ( bring inhaler in with you on day of surgery) Stop taking Aspirin, vitamins, Garlic, fish oil, and herbal medications. Do not take any NSAIDs ie: Ibuprofen, Advil, Naproxen (Aleve), Motrin , BC and Goody Powder ; stop now.  Do not wear jewelry, make-up or nail polish.  Do not wear lotions, powders, or perfumes, or deoderant.  Do not shave 48 hours prior to surgery.    Do not bring valuables to the hospital.  [facility] is not responsible for any belongings or valuables.  Contacts, dentures or bridgework may not be worn into surgery. Patients discharged the day of surgery will not be allowed to drive home.  Special instructions: Shower the night before surgery and the morning of surgery with CHG. Please read over the following fact sheets that you were given. Pain Booklet, Coughing and Deep Breathing and Surgical Site Infection Prevention

## 2017-03-02 NOTE — Progress Notes (Signed)
Pt denies SOB and chest pain. Pt under the care of Dr. Aundra Dubin, Cardiology. Pt denies having a stress test and cardiac cath. Pt denies recent labs.

## 2017-03-03 ENCOUNTER — Telehealth: Payer: Self-pay

## 2017-03-03 NOTE — Telephone Encounter (Signed)
Spoke with patient and she is aware of her appt 05/2017

## 2017-03-08 ENCOUNTER — Encounter (HOSPITAL_COMMUNITY)
Admission: RE | Disposition: A | Discharge status: Home / Self Care | Payer: Self-pay | Source: Ambulatory Visit | Attending: General Surgery

## 2017-03-08 ENCOUNTER — Ambulatory Visit (HOSPITAL_COMMUNITY): Payer: Medicare FFS | Admitting: Anesthesiology

## 2017-03-08 ENCOUNTER — Encounter (HOSPITAL_COMMUNITY): Payer: Self-pay | Admitting: *Deleted

## 2017-03-08 ENCOUNTER — Ambulatory Visit (HOSPITAL_COMMUNITY)
Admission: RE | Admit: 2017-03-08 | Discharge: 2017-03-08 | Disposition: A | Discharge status: Home / Self Care | Payer: Medicare FFS | Source: Ambulatory Visit | Attending: General Surgery | Admitting: General Surgery

## 2017-03-08 DIAGNOSIS — Z803 Family history of malignant neoplasm of breast: Secondary | ICD-10-CM | POA: Diagnosis not present

## 2017-03-08 DIAGNOSIS — M1990 Unspecified osteoarthritis, unspecified site: Secondary | ICD-10-CM | POA: Insufficient documentation

## 2017-03-08 DIAGNOSIS — K219 Gastro-esophageal reflux disease without esophagitis: Secondary | ICD-10-CM | POA: Diagnosis not present

## 2017-03-08 DIAGNOSIS — Z91040 Latex allergy status: Secondary | ICD-10-CM | POA: Insufficient documentation

## 2017-03-08 DIAGNOSIS — I10 Essential (primary) hypertension: Secondary | ICD-10-CM | POA: Insufficient documentation

## 2017-03-08 DIAGNOSIS — Z888 Allergy status to other drugs, medicaments and biological substances status: Secondary | ICD-10-CM | POA: Diagnosis not present

## 2017-03-08 DIAGNOSIS — Z881 Allergy status to other antibiotic agents status: Secondary | ICD-10-CM | POA: Diagnosis not present

## 2017-03-08 DIAGNOSIS — Z452 Encounter for adjustment and management of vascular access device: Secondary | ICD-10-CM | POA: Insufficient documentation

## 2017-03-08 DIAGNOSIS — Z79899 Other long term (current) drug therapy: Secondary | ICD-10-CM | POA: Insufficient documentation

## 2017-03-08 DIAGNOSIS — Z9221 Personal history of antineoplastic chemotherapy: Secondary | ICD-10-CM | POA: Insufficient documentation

## 2017-03-08 DIAGNOSIS — M419 Scoliosis, unspecified: Secondary | ICD-10-CM | POA: Insufficient documentation

## 2017-03-08 DIAGNOSIS — F419 Anxiety disorder, unspecified: Secondary | ICD-10-CM | POA: Insufficient documentation

## 2017-03-08 HISTORY — PX: PORT-A-CATH REMOVAL: SHX5289

## 2017-03-08 SURGERY — REMOVAL PORT-A-CATH
Anesthesia: Monitor Anesthesia Care | Site: Chest | Laterality: Left

## 2017-03-08 MED ORDER — OXYCODONE HCL 5 MG/5ML PO SOLN
5.0000 mg | Freq: Once | ORAL | Status: DC | PRN
Start: 2017-03-08 — End: 2017-03-08

## 2017-03-08 MED ORDER — SODIUM CHLORIDE 0.9 % IV SOLN: 250.0000 mL | INTRAVENOUS | Status: DC | PRN

## 2017-03-08 MED ORDER — LACTATED RINGERS IV SOLN
INTRAVENOUS | Status: DC
  Administered 2017-03-08: 08:00:00 via INTRAVENOUS

## 2017-03-08 MED ORDER — MIDAZOLAM HCL 2 MG/2ML IJ SOLN
INTRAMUSCULAR | Status: AC
  Filled 2017-03-08: qty 2

## 2017-03-08 MED ORDER — ONDANSETRON HCL 4 MG/2ML IJ SOLN
INTRAMUSCULAR | Status: AC
  Filled 2017-03-08: qty 2

## 2017-03-08 MED ORDER — CEFAZOLIN SODIUM-DEXTROSE 2-4 GM/100ML-% IV SOLN
2.0000 g | INTRAVENOUS | Status: AC
  Administered 2017-03-08: 2 g via INTRAVENOUS
  Filled 2017-03-08: qty 100

## 2017-03-08 MED ORDER — LIDOCAINE HCL (PF) 1 % IJ SOLN
INTRAMUSCULAR | Status: AC
  Filled 2017-03-08: qty 30

## 2017-03-08 MED ORDER — CHLORHEXIDINE GLUCONATE CLOTH 2 % EX PADS: 6.0000 | MEDICATED_PAD | Freq: Once | CUTANEOUS | Status: DC

## 2017-03-08 MED ORDER — LIDOCAINE HCL 1 % IJ SOLN
INTRAMUSCULAR | Status: DC | PRN
  Administered 2017-03-08: 7 mL

## 2017-03-08 MED ORDER — FENTANYL CITRATE (PF) 100 MCG/2ML IJ SOLN
INTRAMUSCULAR | Status: DC | PRN
  Administered 2017-03-08 (×3): 25 ug via INTRAVENOUS
  Administered 2017-03-08: 50 ug via INTRAVENOUS
  Administered 2017-03-08: 25 ug via INTRAVENOUS

## 2017-03-08 MED ORDER — ACETAMINOPHEN 650 MG RE SUPP: 650.0000 mg | RECTAL | Status: DC | PRN

## 2017-03-08 MED ORDER — BUPIVACAINE-EPINEPHRINE (PF) 0.25% -1:200000 IJ SOLN
INTRAMUSCULAR | Status: AC
  Filled 2017-03-08: qty 30

## 2017-03-08 MED ORDER — PROPOFOL 10 MG/ML IV BOLUS
INTRAVENOUS | Status: DC | PRN
  Administered 2017-03-08 (×3): 20 mg via INTRAVENOUS
  Administered 2017-03-08: 40 mg via INTRAVENOUS

## 2017-03-08 MED ORDER — 0.9 % SODIUM CHLORIDE (POUR BTL) OPTIME
TOPICAL | Status: DC | PRN
  Administered 2017-03-08: 1000 mL

## 2017-03-08 MED ORDER — SODIUM CHLORIDE 0.9% FLUSH: 3.0000 mL | INTRAVENOUS | Status: DC | PRN

## 2017-03-08 MED ORDER — FENTANYL CITRATE (PF) 250 MCG/5ML IJ SOLN
INTRAMUSCULAR | Status: AC
  Filled 2017-03-08: qty 5

## 2017-03-08 MED ORDER — OXYCODONE HCL 5 MG PO TABS: 5.0000 mg | ORAL_TABLET | Freq: Once | ORAL | Status: DC | PRN

## 2017-03-08 MED ORDER — PROPOFOL 10 MG/ML IV BOLUS
INTRAVENOUS | Status: AC
  Filled 2017-03-08: qty 20

## 2017-03-08 MED ORDER — ONDANSETRON HCL 4 MG/2ML IJ SOLN: 4.0000 mg | Freq: Once | INTRAMUSCULAR | Status: DC | PRN

## 2017-03-08 MED ORDER — FENTANYL CITRATE (PF) 100 MCG/2ML IJ SOLN: 25.0000 ug | INTRAMUSCULAR | Status: DC | PRN

## 2017-03-08 MED ORDER — MIDAZOLAM HCL 5 MG/5ML IJ SOLN
INTRAMUSCULAR | Status: DC | PRN
  Administered 2017-03-08 (×2): 1 mg via INTRAVENOUS

## 2017-03-08 MED ORDER — OXYCODONE HCL 5 MG PO TABS: 5.0000 mg | ORAL_TABLET | ORAL | Status: DC | PRN

## 2017-03-08 MED ORDER — ONDANSETRON HCL 4 MG/2ML IJ SOLN
INTRAMUSCULAR | Status: DC | PRN
Start: 2017-03-08 — End: 2017-03-08
  Administered 2017-03-08: 4 mg via INTRAVENOUS

## 2017-03-08 MED ORDER — ACETAMINOPHEN 325 MG PO TABS: 650.0000 mg | ORAL_TABLET | ORAL | Status: DC | PRN

## 2017-03-08 MED ORDER — SODIUM CHLORIDE 0.9% FLUSH: 3.0000 mL | Freq: Two times a day (BID) | INTRAVENOUS | Status: DC

## 2017-03-08 SURGICAL SUPPLY — 33 items
BLADE SURG 15 STRL LF DISP TIS (BLADE) ×1 IMPLANT
BLADE SURG 15 STRL SS (BLADE) ×1
CHLORAPREP W/TINT 10.5 ML (MISCELLANEOUS) ×2 IMPLANT
COVER SURGICAL LIGHT HANDLE (MISCELLANEOUS) ×2 IMPLANT
DECANTER SPIKE VIAL GLASS SM (MISCELLANEOUS) ×4 IMPLANT
DERMABOND ADVANCED (GAUZE/BANDAGES/DRESSINGS) ×1
DERMABOND ADVANCED .7 DNX12 (GAUZE/BANDAGES/DRESSINGS) ×1 IMPLANT
DRAPE LAPAROTOMY 100X72 PEDS (DRAPES) ×2 IMPLANT
DRAPE UTILITY XL STRL (DRAPES) ×4 IMPLANT
ELECT CAUTERY BLADE 6.4 (BLADE) ×2 IMPLANT
ELECT REM PT RETURN 9FT ADLT (ELECTROSURGICAL) ×2
ELECTRODE REM PT RTRN 9FT ADLT (ELECTROSURGICAL) ×1 IMPLANT
GAUZE SPONGE 4X4 16PLY XRAY LF (GAUZE/BANDAGES/DRESSINGS) ×2 IMPLANT
GLOVE BIOGEL PI IND STRL 6 (GLOVE) ×2 IMPLANT
GLOVE BIOGEL PI IND STRL 6.5 (GLOVE) ×1 IMPLANT
GLOVE BIOGEL PI INDICATOR 6 (GLOVE) ×2
GLOVE BIOGEL PI INDICATOR 6.5 (GLOVE) ×1
GLOVE INDICATOR 6.5 STRL GRN (GLOVE) ×2 IMPLANT
GOWN STRL REUS W/TWL 2XL LVL3 (GOWN DISPOSABLE) ×2 IMPLANT
KIT BASIN OR (CUSTOM PROCEDURE TRAY) ×2 IMPLANT
KIT ROOM TURNOVER OR (KITS) ×2 IMPLANT
NEEDLE HYPO 25GX1X1/2 BEV (NEEDLE) ×2 IMPLANT
NS IRRIG 1000ML POUR BTL (IV SOLUTION) ×2 IMPLANT
PACK SURGICAL SETUP 50X90 (CUSTOM PROCEDURE TRAY) ×2 IMPLANT
PAD ARMBOARD 7.5X6 YLW CONV (MISCELLANEOUS) ×4 IMPLANT
PENCIL BUTTON HOLSTER BLD 10FT (ELECTRODE) ×2 IMPLANT
SUT MNCRL AB 4-0 PS2 18 (SUTURE) ×2 IMPLANT
SUT MON AB 4-0 PC3 18 (SUTURE) ×2 IMPLANT
SUT VIC AB 3-0 SH 27 (SUTURE) ×1
SUT VIC AB 3-0 SH 27X BRD (SUTURE) ×1 IMPLANT
SYR CONTROL 10ML LL (SYRINGE) ×2 IMPLANT
TOWEL OR 17X24 6PK STRL BLUE (TOWEL DISPOSABLE) ×2 IMPLANT
TOWEL OR 17X26 10 PK STRL BLUE (TOWEL DISPOSABLE) ×2 IMPLANT

## 2017-03-08 NOTE — H&P (Signed)
Brenda Harding is an 61 y.o. female.   Chief Complaint: right breast cancer, port in place HPI: Pt is s/p breast conservation for R UOQ breast cancer, pT2N0M0, ER + tumor.  She underwent adjuvant chemotherapy and desires port removal.    Past Medical History:  Diagnosis Date  . Anxiety   . Arthritis   . Bronchitis    HISTORY   . Cough productive of purulent sputum    LIGHT GREEN   . Early cataracts, bilateral   . GERD (gastroesophageal reflux disease)   . Headache   . History of wheezing   . Hypertension   . Malignant neoplasm of upper-outer quadrant of right female breast ([place]) 02/25/2016  . Scoliosis     Past Surgical History:  Procedure Laterality Date  . BACK SURGERY     scoliosis spinal surgery 1970  . benign tumor removal     1978  . CARPAL TUNNEL RELEASE    . TRIGGER FINGER RELEASE      Family History  Problem Relation Age of Onset  . Breast cancer Maternal Aunt   . Breast cancer Cousin   . Colon cancer Cousin    Social History:  reports that  has never smoked. she has never used smokeless tobacco. She reports that she does not drink alcohol or use drugs.  Allergies:  Allergies  Allergen Reactions  . Propoxyphene Other (See Comments)    Passed out, gets dizzy  . Inapsine [Droperidol]     PATIENT PREFERENCE PATIENT HAS HAD NO REACTION TO THIS Pt does not want to be given this, sister had allergy to this  . Latex Itching  . Levaquin [Levofloxacin] Diarrhea and Nausea And Vomiting    Medications Prior to Admission  Medication Sig Dispense Refill  . ALPRAZolam (XANAX) 0.5 MG tablet Take 1 tablet (0.5 mg total) by mouth 2 (two) times daily as needed (for anxiety). 60 tablet 0  . anastrozole (ARIMIDEX) 1 MG tablet Take 1 tablet (1 mg total) by mouth daily. (Patient taking differently: Take 1 mg by mouth at bedtime. ) 90 tablet 3  . betamethasone dipropionate (DIPROLENE) 0.05 % cream Apply 1 application topically every Monday, Wednesday, and Friday. Applied to  vagina    . Biotin 10000 MCG TBDP Take 10,000 mcg by mouth daily.    Marland Kitchen BREO ELLIPTA 200-25 MCG/INH AEPB Inhale 1 puff into the lungs daily.    . Cholecalciferol (VITAMIN D3) 5000 units TABS Take 2,500 Units by mouth 2 (two) times a week.    . diphenoxylate-atropine (LOMOTIL) 2.5-0.025 MG tablet Take 1 tablet by mouth 4 (four) times daily as needed for diarrhea or loose stools. 30 tablet 0  . Garlic 6948 MG CAPS Take 1,000 mg by mouth daily.    . lansoprazole (PREVACID) 30 MG capsule Take 30 mg by mouth daily.     Marland Kitchen levocetirizine (XYZAL) 5 MG tablet Take 5 mg by mouth every evening.    . montelukast (SINGULAIR) 10 MG tablet Take 10 mg by mouth daily.     . Multiple Vitamin (MULTIVITAMIN WITH MINERALS) TABS tablet Take 1 tablet by mouth daily. Centrum Silver    . PREMARIN vaginal cream Place 1 Applicatorful vaginally 2 (two) times a week. Tuesday & Saturday    . saccharomyces boulardii (FLORASTOR) 250 MG capsule Take 250 mg by mouth daily.    Marland Kitchen spironolactone (ALDACTONE) 50 MG tablet Take 50 mg by mouth 2 (two) times daily.     . Triamcinolone Acetonide (NASACORT ALLERGY 24HR  NA) Place 2 sprays into the nose daily.     . verapamil (CALAN-SR) 240 MG CR tablet Take 240 mg by mouth daily.    Marland Kitchen acetaminophen (TYLENOL) 500 MG tablet Take 500 mg by mouth every 6 (six) hours as needed (for pain.).    Marland Kitchen albuterol (PROVENTIL HFA;VENTOLIN HFA) 108 (90 Base) MCG/ACT inhaler Inhale 2 puffs into the lungs 2 (two) times daily as needed for wheezing or shortness of breath.     . chlorpheniramine-HYDROcodone (TUSSIONEX PENNKINETIC ER) 10-8 MG/5ML SUER Take 5 mLs by mouth at bedtime as needed for cough.    Marland Kitchen guaiFENesin (MUCINEX) 600 MG 12 hr tablet Take 600 mg by mouth 2 (two) times daily as needed for cough or to loosen phlegm.     Marland Kitchen ibuprofen (ADVIL,MOTRIN) 800 MG tablet 800 mg every 8 (eight) hours as needed (for pain.).       No results found for this or any previous visit (from the past 48 hour(s)). No  results found.  Review of Systems  All other systems reviewed and are negative.   Height 5\' 5"  (1.651 m), weight 72.1 kg (159 lb). Physical Exam  Constitutional: She is oriented to person, place, and time. She appears well-developed and well-nourished. No distress.  HENT:  Head: Normocephalic and atraumatic.  Eyes: Conjunctivae are normal. Pupils are equal, round, and reactive to light.  Neck: Normal range of motion. Neck supple.  Cardiovascular: Normal rate.  Respiratory: Effort normal.  GI: Soft.  Musculoskeletal: Normal range of motion.  Neurological: She is alert and oriented to person, place, and time.  Skin: Skin is warm and dry. She is not diaphoretic.  Psychiatric: She has a normal mood and affect. Her behavior is normal. Judgment and thought content normal.     Assessment/Plan Right breast cancer, s/p adjuvant chemotherapy.  Plan port removal. Discussed risks.   [name], MD 03/08/2017, 7:46 AM

## 2017-03-08 NOTE — Op Note (Signed)
  PRE-OPERATIVE DIAGNOSIS:  un-needed Port-A-Cath for right breast cancer  POST-OPERATIVE DIAGNOSIS:  Same   PROCEDURE:  Procedure(s):  REMOVAL PORT-A-CATH  SURGEON:  Surgeon(s):  Venissa Nappi, MD  ANESTHESIA:   MAC + local  EBL:   Minimal  SPECIMEN:  None  Complications : none known  Procedure:   Pt was  identified in the holding area and taken to the operating room where she was placed supine on the operating room table.  MAC anesthesia was induced.  The left upper chest was prepped and draped.  The prior incision was anesthetized with local anesthetic.  The incision was opened with a #15 blade.  The subcutaneous tissue was divided with the cautery.  The port was identified and the capsule opened.  The four 2-0 prolene sutures were removed.  The port was then removed and pressure held on the tract.  The catheter appeared intact without evidence of breakage, length was 24 cm.  The wound was inspected for hemostasis, which was achieved with cautery.  The wound was closed with 3-0 vicryl deep dermal interrupted sutures and 4-0 Monocryl running subcuticular suture.  The wound was cleaned, dried, and dressed with dermabond.  The patient was awakened from anesthesia and taken to the PACU in stable condition.  Needle, sponge, and instrument counts are correct.    

## 2017-03-08 NOTE — Anesthesia Postprocedure Evaluation (Signed)
Anesthesia Post Note  Patient: Brenda Harding  Procedure(s) Performed: REMOVAL PORT-A-CATH (Left Chest)     Patient location during evaluation: PACU Anesthesia Type: MAC Level of consciousness: awake, awake and alert and oriented Pain management: pain level controlled Vital Signs Assessment: post-procedure vital signs reviewed and stable Respiratory status: spontaneous breathing, nonlabored ventilation and respiratory function stable Cardiovascular status: blood pressure returned to baseline Anesthetic complications: no    Last Vitals:  Vitals:   03/08/17 1029 03/08/17 1030  BP: 110/83 116/75  Pulse: 76 82  Resp: 20 17  Temp:    SpO2: 100% 100%    Last Pain:  Vitals:   03/08/17 1030  TempSrc:   PainSc: 0-No pain                 Winona Sison COKER

## 2017-03-08 NOTE — Discharge Instructions (Addendum)
[place] Office Phone Number [phone]   POST OP INSTRUCTIONS  Always review your discharge instruction sheet given to you by the facility where your surgery was performed.  IF YOU HAVE DISABILITY OR FAMILY LEAVE FORMS, YOU MUST BRING THEM TO THE OFFICE FOR PROCESSING.  DO NOT GIVE THEM TO YOUR DOCTOR.  1. A prescription for pain medication may be given to you upon discharge.  Take your pain medication as prescribed, if needed.  If narcotic pain medicine is not needed, then you may take acetaminophen (Tylenol) or ibuprofen (Advil) as needed. 2. Take your usually prescribed medications unless otherwise directed 3. If you need a refill on your pain medication, please contact your pharmacy.  They will contact our office to request authorization.  Prescriptions will not be filled after 5pm or on week-ends. 4. You should eat very light the first 24 hours after surgery, such as soup, crackers, pudding, etc.  Resume your normal diet the day after surgery 5. It is common to experience some constipation if taking pain medication after surgery.  Increasing fluid intake and taking a stool softener will usually help or prevent this problem from occurring.  A mild laxative (Milk of Magnesia or Miralax) should be taken according to package directions if there are no bowel movements after 48 hours. 6. You may shower in 48 hours.  The surgical glue will flake off in 2-3 weeks.   7. ACTIVITIES:  No strenuous activity or heavy lifting for 1 week.   a. You may drive when you no longer are taking prescription pain medication, you can comfortably wear a seatbelt, and you can safely maneuver your car and apply brakes. b. RETURN TO WORK:  __________3-7 days_______________ Dennis Bast should see your doctor in the office for a follow-up appointment approximately three-four weeks after your surgery.    WHEN TO CALL YOUR DOCTOR: 1. Fever over 101.0 2. Nausea and/or vomiting. 3. Extreme swelling or  bruising. 4. Continued bleeding from incision. 5. Increased pain, redness, or drainage from the incision.  The clinic staff is available to answer your questions during regular business hours.  Please dont hesitate to call and ask to speak to one of the nurses for clinical concerns.  If you have a medical emergency, go to the nearest emergency room or call 911.  A surgeon from [facility] is always on call at the hospital.  For further questions, please visit [email or web address]

## 2017-03-08 NOTE — Transfer of Care (Signed)
Immediate Anesthesia Transfer of Care Note  Patient: Brenda Harding  Procedure(s) Performed: REMOVAL PORT-A-CATH (Left Chest)  Patient Location: PACU  Anesthesia Type:MAC  Level of Consciousness: awake, alert , oriented, patient cooperative and responds to stimulation  Airway & Oxygen Therapy: Patient Spontanous Breathing, Patient connected to nasal cannula oxygen and Patient placed on Ventilator (see vital sign flow sheet for setting)  Post-op Assessment: Report given to RN, Post -op Vital signs reviewed and stable and Patient moving all extremities  Post vital signs: Reviewed and stable  Last Vitals:  Vitals:   03/08/17 0749  BP: 107/73  Pulse: 81  Resp: 18  Temp: 36.6 C  SpO2: 98%    Last Pain:  Vitals:   03/08/17 0749  TempSrc: Oral      Patients Stated Pain Goal: 0 (33/58/25 1898)  Complications: No apparent anesthesia complications

## 2017-03-08 NOTE — Anesthesia Preprocedure Evaluation (Signed)
Anesthesia Evaluation  Patient identified by MRN, date of birth, ID band Patient awake    Reviewed: Allergy & Precautions, NPO status , Patient's Chart, lab work & pertinent test results  Airway Mallampati: II  TM Distance: >3 FB Neck ROM: Full    Dental  (+) Teeth Intact, Dental Advisory Given   Pulmonary    breath sounds clear to auscultation       Cardiovascular hypertension,  Rhythm:Regular Rate:Normal     Neuro/Psych    GI/Hepatic   Endo/Other    Renal/GU      Musculoskeletal   Abdominal   Peds  Hematology   Anesthesia Other Findings   Reproductive/Obstetrics                             Anesthesia Physical Anesthesia Plan  ASA: II  Anesthesia Plan: MAC   Post-op Pain Management:    Induction:   PONV Risk Score and Plan: Ondansetron and Dexamethasone  Airway Management Planned: Simple Face Mask and Natural Airway  Additional Equipment:   Intra-op Plan:   Post-operative Plan:   Informed Consent: I have reviewed the patients History and Physical, chart, labs and discussed the procedure including the risks, benefits and alternatives for the proposed anesthesia with the patient or authorized representative who has indicated his/her understanding and acceptance.   Dental advisory given  Plan Discussed with: CRNA and Anesthesiologist  Anesthesia Plan Comments:         Anesthesia Quick Evaluation

## 2017-03-09 ENCOUNTER — Encounter (HOSPITAL_COMMUNITY): Payer: Self-pay | Admitting: General Surgery

## 2017-03-21 ENCOUNTER — Other Ambulatory Visit: Payer: Medicare FFS

## 2017-03-21 ENCOUNTER — Ambulatory Visit: Payer: Medicare FFS

## 2017-03-21 ENCOUNTER — Ambulatory Visit: Payer: Medicare FFS | Admitting: Hematology and Oncology

## 2017-04-17 ENCOUNTER — Other Ambulatory Visit: Payer: Self-pay | Admitting: General Surgery

## 2017-04-17 DIAGNOSIS — C50211 Malignant neoplasm of upper-inner quadrant of right female breast: Secondary | ICD-10-CM

## 2017-04-18 ENCOUNTER — Ambulatory Visit
Admission: RE | Admit: 2017-04-18 | Discharge: 2017-04-18 | Disposition: A | Discharge status: Home / Self Care | Payer: Medicare FFS | Source: Ambulatory Visit | Attending: General Surgery | Admitting: General Surgery

## 2017-04-18 ENCOUNTER — Ambulatory Visit (HOSPITAL_BASED_OUTPATIENT_CLINIC_OR_DEPARTMENT_OTHER)
Admission: RE | Admit: 2017-04-18 | Discharge: 2017-04-18 | Disposition: A | Discharge status: Home / Self Care | Payer: Medicare FFS | Source: Ambulatory Visit | Attending: Cardiology | Admitting: Cardiology

## 2017-04-18 ENCOUNTER — Encounter (HOSPITAL_COMMUNITY): Payer: Self-pay | Admitting: Cardiology

## 2017-04-18 ENCOUNTER — Ambulatory Visit (HOSPITAL_COMMUNITY)
Admission: RE | Admit: 2017-04-18 | Discharge: 2017-04-18 | Disposition: A | Discharge status: Home / Self Care | Payer: Medicare FFS | Source: Ambulatory Visit | Attending: Internal Medicine | Admitting: Internal Medicine

## 2017-04-18 VITALS — BP 152/91 | HR 71 | Wt 159.4 lb

## 2017-04-18 DIAGNOSIS — F419 Anxiety disorder, unspecified: Secondary | ICD-10-CM | POA: Insufficient documentation

## 2017-04-18 DIAGNOSIS — Z170 Estrogen receptor positive status [ER+]: Secondary | ICD-10-CM | POA: Insufficient documentation

## 2017-04-18 DIAGNOSIS — I10 Essential (primary) hypertension: Secondary | ICD-10-CM | POA: Insufficient documentation

## 2017-04-18 DIAGNOSIS — Z791 Long term (current) use of non-steroidal anti-inflammatories (NSAID): Secondary | ICD-10-CM | POA: Diagnosis not present

## 2017-04-18 DIAGNOSIS — R0789 Other chest pain: Secondary | ICD-10-CM | POA: Diagnosis not present

## 2017-04-18 DIAGNOSIS — Z8249 Family history of ischemic heart disease and other diseases of the circulatory system: Secondary | ICD-10-CM | POA: Diagnosis not present

## 2017-04-18 DIAGNOSIS — Z853 Personal history of malignant neoplasm of breast: Secondary | ICD-10-CM | POA: Insufficient documentation

## 2017-04-18 DIAGNOSIS — Z79899 Other long term (current) drug therapy: Secondary | ICD-10-CM | POA: Diagnosis not present

## 2017-04-18 DIAGNOSIS — C50411 Malignant neoplasm of upper-outer quadrant of right female breast: Secondary | ICD-10-CM | POA: Diagnosis not present

## 2017-04-18 DIAGNOSIS — E785 Hyperlipidemia, unspecified: Secondary | ICD-10-CM | POA: Diagnosis not present

## 2017-04-18 DIAGNOSIS — Z9221 Personal history of antineoplastic chemotherapy: Secondary | ICD-10-CM | POA: Diagnosis not present

## 2017-04-18 DIAGNOSIS — C50211 Malignant neoplasm of upper-inner quadrant of right female breast: Secondary | ICD-10-CM

## 2017-04-18 HISTORY — DX: Personal history of irradiation: Z92.3

## 2017-04-18 HISTORY — DX: Personal history of antineoplastic chemotherapy: Z92.21

## 2017-04-18 HISTORY — DX: Malignant neoplasm of unspecified site of unspecified female breast: C50.919

## 2017-04-18 NOTE — Progress Notes (Signed)
  Echocardiogram 2D Echocardiogram has been performed.  Brenda Harding 04/18/2017, 11:59 AM

## 2017-04-18 NOTE — Patient Instructions (Signed)
Check BP daily for 2 weeks then give [phone] opt. 5 with the results of reading  Your physician recommends that you schedule a follow-up appointment in: As needed

## 2017-04-18 NOTE — Progress Notes (Signed)
Oncology: Dr. Lindi Adie  61 yo with history of HTN and breast cancer returns for cardio-oncology followup/echo. She has HTN controlled by verapamil CR and spironolactone.  Breast cancer was diagnosed by biopsy in 10/17.  ER+/PR-/HER2+.  Plan for [facility] Perjeta chemo x 6 cycles, then Herceptin alone to complete 1 year of treatment.  She had a lumpectomy in 4/18 and has finished radiation.  She has now finished Herceptin.   No exertional dyspnea.  BP is high again today. She had an episode of pulling in her chest shortly after port-a-cath was removed.  This was around Thanksgiving and lasted most of a day.  The pain was worsened with movement.  It has not recurred since that day.    PMH:  1. HTN 2. Hyperlipidemia: Diet-controlled.  3. Anxiety 4. Breast cancer:  Diagnosed 10/17, ER+/PR-/HER2+.  Plan for [facility] Perjeta chemo x 6 cycles, then Herceptin alone to complete 1 year of treatment. Lumpectomy 4/18.  - Echo (11/17): EF 60-65%, GLS -18.5%.  - Echo (3/18): EF 55-60%, GLS -20.8%, normal RV size and systolic function.  - Echo (6/18): EF 55-60%, GLS -19.5% - Echo (9/18): EF 55-60%, GLS -20.2% - Echo (12/18): EF 55-60%, GLS -18.6%, normal RV size and systolic function.   SH: Lives in [place], never smoked, rare ETOH.   FH: Sister with CABG at age 78. HTN.   ROS: All systems reviewed and negative except as per HPI.   Current Outpatient Medications  Medication Sig Dispense Refill  . acetaminophen (TYLENOL) 500 MG tablet Take 500 mg by mouth every 6 (six) hours as needed (for pain.).    Marland Kitchen albuterol (PROVENTIL HFA;VENTOLIN HFA) 108 (90 Base) MCG/ACT inhaler Inhale 2 puffs into the lungs 2 (two) times daily as needed for wheezing or shortness of breath.     . ALPRAZolam (XANAX) 0.5 MG tablet Take 1 tablet (0.5 mg total) by mouth 2 (two) times daily as needed (for anxiety). 60 tablet 0  . anastrozole (ARIMIDEX) 1 MG tablet Take 1 tablet (1 mg total) by mouth daily. (Patient taking differently: Take 1 mg  by mouth at bedtime. ) 90 tablet 3  . betamethasone dipropionate (DIPROLENE) 0.05 % cream Apply 1 application topically every Monday, Wednesday, and Friday. Applied to vagina    . Biotin 10000 MCG TBDP Take 10,000 mcg by mouth daily.    Marland Kitchen BREO ELLIPTA 200-25 MCG/INH AEPB Inhale 1 puff into the lungs daily.    . chlorpheniramine-HYDROcodone (TUSSIONEX PENNKINETIC ER) 10-8 MG/5ML SUER Take 5 mLs by mouth at bedtime as needed for cough.    . Cholecalciferol (VITAMIN D3) 5000 units TABS Take 2,500 Units by mouth 2 (two) times a week.    . diphenoxylate-atropine (LOMOTIL) 2.5-0.025 MG tablet Take 1 tablet by mouth 4 (four) times daily as needed for diarrhea or loose stools. 30 tablet 0  . Garlic 8590 MG CAPS Take 1,000 mg by mouth daily.    Marland Kitchen guaiFENesin (MUCINEX) 600 MG 12 hr tablet Take 600 mg by mouth 2 (two) times daily as needed for cough or to loosen phlegm.     Marland Kitchen ibuprofen (ADVIL,MOTRIN) 800 MG tablet 800 mg every 8 (eight) hours as needed (for pain.).     Marland Kitchen lansoprazole (PREVACID) 30 MG capsule Take 30 mg by mouth daily.     Marland Kitchen levocetirizine (XYZAL) 5 MG tablet Take 5 mg by mouth every evening.    . montelukast (SINGULAIR) 10 MG tablet Take 10 mg by mouth daily.     . Multiple  Vitamin (MULTIVITAMIN WITH MINERALS) TABS tablet Take 1 tablet by mouth daily. Centrum Silver    . PREMARIN vaginal cream Place 1 Applicatorful vaginally 2 (two) times a week. Tuesday & Saturday    . saccharomyces boulardii (FLORASTOR) 250 MG capsule Take 250 mg by mouth daily.    Marland Kitchen spironolactone (ALDACTONE) 50 MG tablet Take 50 mg by mouth 2 (two) times daily.     . Triamcinolone Acetonide (NASACORT ALLERGY 24HR NA) Place 2 sprays into the nose daily.     . verapamil (CALAN-SR) 240 MG CR tablet Take 240 mg by mouth daily.     No current facility-administered medications for this encounter.    Facility-Administered Medications Ordered in Other Encounters  Medication Dose Route Frequency Provider Last Rate Last Dose   . sodium chloride flush (NS) 0.9 % injection 10 mL  10 mL Intravenous PRN Nicholas Lose, MD   10 mL at 09/27/16 1326  . sodium chloride flush (NS) 0.9 % injection 10 mL  10 mL Intracatheter PRN Nicholas Lose, MD   10 mL at 12/25/16 1450   BP (!) 152/91   Pulse 71   Wt 159 lb 6.4 oz (72.3 kg)   SpO2 99%   BMI 26.53 kg/m  General: NAD Neck: No JVD, no thyromegaly or thyroid nodule.  Lungs: Clear to auscultation bilaterally with normal respiratory effort. CV: Nondisplaced PMI.  Heart regular S1/S2, no S3/S4, no murmur.  No peripheral edema.  No carotid bruit.  Normal pedal pulses.  Abdomen: Soft, nontender, no hepatosplenomegaly, no distention.  Skin: Intact without lesions or rashes.  Neurologic: Alert and oriented x 3.  Psych: Normal affect. Extremities: No clubbing or cyanosis.  HEENT: Normal.   Assessment/Plan:  1. HTN: BP elevated today.  She has a cuff at home, will have her check daily x 2 wks and call with readings.  If BP elevated, would increase verapamil back to 360 mg daily (had been on this dose in the past).   2. Breast cancer: She has completed Herceptin.  I reviewed today's echo: EF and strain pattern remain normal. - No further screening echoes needed.  3. Chest pain: Atypical, think noncardiac (musculoskeletal).  No further workup unless there is a recurrence.   Loralie Champagne 04/18/2017

## 2017-05-02 ENCOUNTER — Ambulatory Visit: Payer: Medicare FFS | Admitting: Radiation Oncology

## 2017-05-03 ENCOUNTER — Ambulatory Visit: Payer: Medicare FFS

## 2017-05-04 ENCOUNTER — Ambulatory Visit: Payer: Medicare FFS

## 2017-05-07 ENCOUNTER — Ambulatory Visit: Payer: Medicare FFS

## 2017-05-08 ENCOUNTER — Ambulatory Visit: Payer: Medicare FFS

## 2017-05-09 ENCOUNTER — Ambulatory Visit: Payer: Medicare FFS

## 2017-05-10 ENCOUNTER — Ambulatory Visit: Payer: Medicare FFS

## 2017-05-11 ENCOUNTER — Ambulatory Visit: Payer: Medicare FFS

## 2017-05-14 ENCOUNTER — Ambulatory Visit: Payer: Medicare FFS

## 2017-05-15 ENCOUNTER — Ambulatory Visit: Payer: Medicare FFS

## 2017-05-22 ENCOUNTER — Encounter: Payer: Medicare FFS | Admitting: Adult Health

## 2017-06-26 ENCOUNTER — Telehealth: Payer: Self-pay

## 2017-06-26 NOTE — Telephone Encounter (Signed)
Returned pt call and left VM regarding concerns with granite countertops getting placed in patient kitchen. Informed pt that we believe it is safe to have granite countertops. I did explain that research says granite can give off a small amount of radon depending on what the product is made with but so does several other household products we tend to use. We believe she is safe to have these countertops.  Cyndia Bent RN

## 2017-08-27 ENCOUNTER — Inpatient Hospital Stay: Payer: Medicare FFS | Attending: Hematology and Oncology | Admitting: Hematology and Oncology

## 2017-08-27 ENCOUNTER — Inpatient Hospital Stay: Payer: Medicare FFS

## 2017-08-27 ENCOUNTER — Telehealth: Payer: Self-pay | Admitting: Hematology and Oncology

## 2017-08-27 DIAGNOSIS — Z79899 Other long term (current) drug therapy: Secondary | ICD-10-CM | POA: Diagnosis not present

## 2017-08-27 DIAGNOSIS — C50411 Malignant neoplasm of upper-outer quadrant of right female breast: Secondary | ICD-10-CM | POA: Diagnosis not present

## 2017-08-27 DIAGNOSIS — K589 Irritable bowel syndrome without diarrhea: Secondary | ICD-10-CM | POA: Diagnosis not present

## 2017-08-27 DIAGNOSIS — Z170 Estrogen receptor positive status [ER+]: Secondary | ICD-10-CM

## 2017-08-27 DIAGNOSIS — Z79811 Long term (current) use of aromatase inhibitors: Secondary | ICD-10-CM | POA: Diagnosis not present

## 2017-08-27 DIAGNOSIS — Z923 Personal history of irradiation: Secondary | ICD-10-CM | POA: Diagnosis not present

## 2017-08-27 DIAGNOSIS — Z9221 Personal history of antineoplastic chemotherapy: Secondary | ICD-10-CM | POA: Diagnosis not present

## 2017-08-27 LAB — CBC WITH DIFFERENTIAL/PLATELET
Basophils Absolute: 0 10*3/uL (ref 0.0–0.1)
Basophils Relative: 0 %
Eosinophils Absolute: 0 10*3/uL (ref 0.0–0.5)
Eosinophils Relative: 1 %
HCT: 41 % (ref 34.8–46.6)
Hemoglobin: 13.4 g/dL (ref 11.6–15.9)
Lymphocytes Relative: 25 %
Lymphs Abs: 1.2 10*3/uL (ref 0.9–3.3)
MCH: 32.8 pg (ref 25.1–34.0)
MCHC: 32.7 g/dL (ref 31.5–36.0)
MCV: 100.2 fL (ref 79.5–101.0)
Monocytes Absolute: 0.5 10*3/uL (ref 0.1–0.9)
Monocytes Relative: 10 %
Neutro Abs: 3 10*3/uL (ref 1.5–6.5)
Neutrophils Relative %: 64 %
Platelets: 208 10*3/uL (ref 145–400)
RBC: 4.09 MIL/uL (ref 3.70–5.45)
RDW: 12.4 % (ref 11.2–14.5)
WBC: 4.7 10*3/uL (ref 3.9–10.3)

## 2017-08-27 LAB — COMPREHENSIVE METABOLIC PANEL
ALT: 15 U/L (ref 0–55)
AST: 17 U/L (ref 5–34)
Albumin: 4.3 g/dL (ref 3.5–5.0)
Alkaline Phosphatase: 119 U/L (ref 40–150)
Anion gap: 8 (ref 3–11)
BUN: 19 mg/dL (ref 7–26)
CO2: 28 mmol/L (ref 22–29)
Calcium: 9.8 mg/dL (ref 8.4–10.4)
Chloride: 104 mmol/L (ref 98–109)
Creatinine, Ser: 1.22 mg/dL — ABNORMAL HIGH (ref 0.60–1.10)
GFR calc Af Amer: 54 mL/min — ABNORMAL LOW (ref >60)
GFR calc non Af Amer: 46 mL/min — ABNORMAL LOW (ref >60)
Glucose, Bld: 89 mg/dL (ref 70–140)
Potassium: 4 mmol/L (ref 3.5–5.1)
Sodium: 140 mmol/L (ref 136–145)
Total Bilirubin: 0.5 mg/dL (ref 0.2–1.2)
Total Protein: 7.2 g/dL (ref 6.4–8.3)

## 2017-08-27 MED ORDER — ANASTROZOLE 1 MG PO TABS: 1.0000 mg | ORAL_TABLET | Freq: Every day | ORAL | 3 refills | Status: DC

## 2017-08-27 NOTE — Telephone Encounter (Signed)
Gave patient AVs and calendar of upcoming April 2020 appointments.  °

## 2017-08-27 NOTE — Assessment & Plan Note (Signed)
Right breast biopsy 1:00, 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  Treatment summary: 1. Neoadjuvant chemotherapy with TCH Perjeta 6 cycles followed by Herceptinand Perjetamaintenance for 1 year 03/22/2016- 07/05/2017 2. Breast conserving surgery with sentinel lymph node study: 08/08/2016 3. Followed by adjuvant radiation therapy 09/11/16- 10/09/16 4. Followed by antiestrogen therapy started July 2018  Right lumpectomy 08/08/2016: 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, no residual invasive cancer, 0/4 lymph nodes negative, ER 5% weak, PR negative, HER-2 positive, Ki-67 90%: Complete pathologic response ypTis ypN0 ------------------------------------------------------------------------------- Current Treatment: Anastrozole started July 2018.  Anastrozole toxicities: Patient takes Premarin vaginal cream Mild fatigue Occasional hot flash  Breast cancer surveillance: Mammograms 04/18/2017: Benign breast density category B  RTC in 6 months for follow-up on antiestrogen therapy.

## 2017-08-27 NOTE — Progress Notes (Signed)
Patient Care Team: Tobe Sos, MD as PCP - General (Internal Medicine) Maisie Fus, MD as Consulting Physician (Obstetrics and Gynecology) [name], MD as Consulting Physician (General Surgery) Nicholas Lose, MD as Consulting Physician (Hematology and Oncology) Kyung Rudd, MD as Consulting Physician (Radiation Oncology) Delice Bison Charlestine Massed, NP as Nurse Practitioner (Hematology and Oncology)  DIAGNOSIS:  Encounter Diagnosis  Name Primary?  . Malignant neoplasm of upper-outer quadrant of right breast in female, estrogen receptor positive ([place])     SUMMARY OF ONCOLOGIC HISTORY:   Malignant neoplasm of upper-outer quadrant of right female breast ([place])   02/24/2016 Initial Diagnosis    Right breast biopsy 1:00, grade 2-3 IDC, ER 5%, PR 0%, Ki-67 90%, HER-2 positive ratio 7.28, right breast biopsy 1:00 1.5 cm medial to the dominant mass fibrocystic changes; ultrasound and mammogram 02/23/2016: 2.5 cm mass at 1:00 position and a small 59m irregular mass 1.5 cm medial to the larger mass, T2 N0 stage II a clinical stage      03/04/2016 Breast MRI    2.9 cm irregular enhancing mass located within the right breast at 1:00 position, several T2 bright lesions in the right lobe of the liver incompletely visualized could be hepatic cysts      03/22/2016 - 07/05/2016 Neo-Adjuvant Chemotherapy    Neo-Adj TCHP 5 cycles followed by Herceptin and Perjeta      07/07/2016 Breast MRI    Right breast cancer: No measurable enhancement, residual 1-2 mm foci; left breast partially cystic lesion at 2:30 position 2.1 cm which is new 1 indeterminate left axillary lymph node      08/09/2016 Surgery    Right lumpectomy: 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, no residual invasive cancer, 0/4 lymph nodes negative, ER 5% week, PR negative, HER-2 positive, Ki-67 90%: Complete pathologic response ypTis ypN0      09/11/2016 - 10/09/2016 Radiation Therapy    1) Right breast: 42.5 Gy in 16 fractions  2) Right  breast boost: 10 Gy in 4 fractions        11/07/2016 -  Anti-estrogen oral therapy    Anastrozole 1 mg daily       CHIEF COMPLIANT: Follow-up on antiestrogen therapy  INTERVAL HISTORY: Brenda LOSIERis a 62year old with above-mentioned history of right breast cancer treated with neoadjuvant chemotherapy followed by lumpectomy and radiation is currently on antiestrogen therapy with anastrozole.  She is tolerating anastrozole fairly well.  She does have occasional hot flashes.  Denies any arthralgias or myalgias.  Denies any lumps or nodules in the breast.  Mammograms done in December were normal. She has been experiencing vaginal dryness and pain with intercourse.   REVIEW OF SYSTEMS:   Constitutional: Denies fevers, chills or abnormal weight loss Eyes: Denies blurriness of vision Ears, nose, mouth, throat, and face: Denies mucositis or sore throat Respiratory: Denies cough, dyspnea or wheezes Cardiovascular: Denies palpitation, chest discomfort Gastrointestinal:  Denies nausea, heartburn or change in bowel habits Skin: Denies abnormal skin rashes Lymphatics: Denies new lymphadenopathy or easy bruising Neurological:Denies numbness, tingling or new weaknesses Behavioral/Psych: Mood is stable, no new changes  Extremities: No lower extremity edema Breast:  denies any pain or lumps or nodules in either breasts All other systems were reviewed with the patient and are negative.  I have reviewed the past medical history, past surgical history, social history and family history with the patient and they are unchanged from previous note.  ALLERGIES:  is allergic to propoxyphene; inapsine [droperidol]; latex; and levaquin [levofloxacin].  MEDICATIONS:  Current Outpatient Medications  Medication Sig Dispense Refill  . acetaminophen (TYLENOL) 500 MG tablet Take 500 mg by mouth every 6 (six) hours as needed (for pain.).    Marland Kitchen albuterol (PROVENTIL HFA;VENTOLIN HFA) 108 (90 Base) MCG/ACT  inhaler Inhale 2 puffs into the lungs 2 (two) times daily as needed for wheezing or shortness of breath.     . ALPRAZolam (XANAX) 0.5 MG tablet Take 1 tablet (0.5 mg total) by mouth 2 (two) times daily as needed (for anxiety). 60 tablet 0  . anastrozole (ARIMIDEX) 1 MG tablet Take 1 tablet (1 mg total) by mouth at bedtime. 90 tablet 3  . betamethasone dipropionate (DIPROLENE) 0.05 % cream Apply 1 application topically every Monday, Wednesday, and Friday. Applied to vagina    . Biotin 10000 MCG TBDP Take 10,000 mcg by mouth daily.    Marland Kitchen BREO ELLIPTA 200-25 MCG/INH AEPB Inhale 1 puff into the lungs daily.    . chlorpheniramine-HYDROcodone (TUSSIONEX PENNKINETIC ER) 10-8 MG/5ML SUER Take 5 mLs by mouth at bedtime as needed for cough.    . Cholecalciferol (VITAMIN D3) 5000 units TABS Take 2,500 Units by mouth 2 (two) times a week.    . diphenoxylate-atropine (LOMOTIL) 2.5-0.025 MG tablet Take 1 tablet by mouth 4 (four) times daily as needed for diarrhea or loose stools. 30 tablet 0  . Garlic 9794 MG CAPS Take 1,000 mg by mouth daily.    Marland Kitchen guaiFENesin (MUCINEX) 600 MG 12 hr tablet Take 600 mg by mouth 2 (two) times daily as needed for cough or to loosen phlegm.     Marland Kitchen ibuprofen (ADVIL,MOTRIN) 800 MG tablet 800 mg every 8 (eight) hours as needed (for pain.).     Marland Kitchen lansoprazole (PREVACID) 30 MG capsule Take 30 mg by mouth daily.     Marland Kitchen levocetirizine (XYZAL) 5 MG tablet Take 5 mg by mouth every evening.    . montelukast (SINGULAIR) 10 MG tablet Take 10 mg by mouth daily.     . Multiple Vitamin (MULTIVITAMIN WITH MINERALS) TABS tablet Take 1 tablet by mouth daily. Centrum Silver    . PREMARIN vaginal cream Place 1 Applicatorful vaginally 2 (two) times a week. Tuesday & Saturday    . saccharomyces boulardii (FLORASTOR) 250 MG capsule Take 250 mg by mouth daily.    Marland Kitchen spironolactone (ALDACTONE) 50 MG tablet Take 50 mg by mouth 2 (two) times daily.     . Triamcinolone Acetonide (NASACORT ALLERGY 24HR NA) Place 2  sprays into the nose daily.     . verapamil (CALAN-SR) 240 MG CR tablet Take 240 mg by mouth daily.     No current facility-administered medications for this visit.    Facility-Administered Medications Ordered in Other Visits  Medication Dose Route Frequency Provider Last Rate Last Dose  . sodium chloride flush (NS) 0.9 % injection 10 mL  10 mL Intravenous PRN Nicholas Lose, MD   10 mL at 09/27/16 1326  . sodium chloride flush (NS) 0.9 % injection 10 mL  10 mL Intracatheter PRN Nicholas Lose, MD   10 mL at 12/25/16 1450    PHYSICAL EXAMINATION: ECOG PERFORMANCE STATUS: 1 - Symptomatic but completely ambulatory  Vitals:   08/27/17 1129  BP: 135/83  Pulse: 72  Resp: 19  Temp: 98.4 F (36.9 C)  SpO2: 100%   Filed Weights   08/27/17 1129  Weight: 167 lb 4.8 oz (75.9 kg)    GENERAL:alert, no distress and comfortable SKIN: skin color, texture, turgor are normal, no  rashes or significant lesions EYES: normal, Conjunctiva are pink and non-injected, sclera clear OROPHARYNX:no exudate, no erythema and lips, buccal mucosa, and tongue normal  NECK: supple, thyroid normal size, non-tender, without nodularity LYMPH:  no palpable lymphadenopathy in the cervical, axillary or inguinal LUNGS: clear to auscultation and percussion with normal breathing effort HEART: regular rate & rhythm and no murmurs and no lower extremity edema ABDOMEN:abdomen soft, non-tender and normal bowel sounds MUSCULOSKELETAL:no cyanosis of digits and no clubbing  NEURO: alert & oriented x 3 with fluent speech, no focal motor/sensory deficits EXTREMITIES: No lower extremity edema  LABORATORY DATA:  I have reviewed the data as listed CMP Latest Ref Rng & Units 08/27/2017 03/02/2017 02/07/2017  Glucose 70 - 140 mg/dL 89 84 103  BUN 7 - 26 mg/dL 19 19 26.6(H)  Creatinine 0.60 - 1.10 mg/dL 1.22(H) 1.15(H) 1.2(H)  Sodium 136 - 145 mmol/L 140 139 139  Potassium 3.5 - 5.1 mmol/L 4.0 4.0 4.2  Chloride 98 - 109 mmol/L 104  105 -  CO2 22 - 29 mmol/L _0 Calcium 8.4 - 10.4 mg/dL 9.8 9.4 9.7  Total Protein 6.4 - 8.3 g/dL 7.2 - 7.2  Total Bilirubin 0.2 - 1.2 mg/dL 0.5 - 0.54  Alkaline Phos 40 - 150 U/L 119 - 177(H)  AST 5 - 34 U/L 17 - 15  ALT 0 - 55 U/L 15 - 12    Lab Results  Component Value Date   WBC 4.7 08/27/2017   HGB 13.4 08/27/2017   HCT 41.0 08/27/2017   MCV 100.2 08/27/2017   PLT 208 08/27/2017   NEUTROABS 3.0 08/27/2017    ASSESSMENT & PLAN:  Malignant neoplasm of upper-outer quadrant of right breast in female, estrogen receptor positive ([place]) Right breast biopsy 1:00, grade 2-3 IDC, ER 5%, PR 0%, Ki-67 90%, HER-2 positive ratio 7.28, right breast biopsy 1:00 1.5 cm medial to the dominant mass fibrocystic changes; ultrasound and mammogram 02/23/2016: 2.5 cm mass at 1:00 position and a small 23m irregular mass 1.5 cm medial to the larger mass, T2 N0 stage II a clinical stage  Breast MRI 03/04/2016: 2.9 cm irregular enhancing mass located within the right breast at 1:00 position, several T2 bright lesions in the right lobe of the liver incompletely visualized could be hepatic cysts.  Treatment summary: 1. Neoadjuvant chemotherapy with TCH Perjeta 6 cycles followed by Herceptinand Perjetamaintenance for 1 year 03/22/2016- 07/05/2017 2. Breast conserving surgery with sentinel lymph node study: 08/08/2016 3. Followed by adjuvant radiation therapy 09/11/16- 10/09/16 4. Followed by antiestrogen therapy started July 2018  Right lumpectomy 08/08/2016: 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, no residual invasive cancer, 0/4 lymph nodes negative, ER 5% weak, PR negative, HER-2 positive, Ki-67 90%: Complete pathologic response ypTis ypN0 ------------------------------------------------------------------------------- Current Treatment: Anastrozole started July 2018.  Anastrozole toxicities: Patient takes Premarin vaginal cream Mild fatigue Occasional hot flash Vaginal dryness: I discussed with her about  MJ[facility] [place] Breast cancer surveillance: Mammograms 04/18/2017: Benign breast density category B  Patient experiencing stress in life which is been aggravating her irritable bowel syndrome causing 4-5 loose stools per day. I discussed with her about stress relaxation techniques as well as part of living is a place that she can go for mindfulness.  RTC in 1 year for follow-up on antiestrogen therapy.   No orders of the defined types were placed in this encounter.  The patient has a good understanding of the overall plan. she agrees with it. she will call with any problems that  may develop before the next visit here.   Harriette Ohara, MD 08/27/17

## 2017-09-11 ENCOUNTER — Encounter: Payer: Self-pay | Admitting: Adult Health

## 2017-09-11 ENCOUNTER — Inpatient Hospital Stay: Payer: Medicare FFS | Attending: Hematology and Oncology | Admitting: Adult Health

## 2017-09-11 VITALS — BP 140/86 | HR 73 | Temp 97.9°F | Resp 18 | Wt 170.0 lb

## 2017-09-11 DIAGNOSIS — Z170 Estrogen receptor positive status [ER+]: Secondary | ICD-10-CM | POA: Insufficient documentation

## 2017-09-11 DIAGNOSIS — I10 Essential (primary) hypertension: Secondary | ICD-10-CM

## 2017-09-11 DIAGNOSIS — Z79811 Long term (current) use of aromatase inhibitors: Secondary | ICD-10-CM | POA: Insufficient documentation

## 2017-09-11 DIAGNOSIS — F419 Anxiety disorder, unspecified: Secondary | ICD-10-CM | POA: Diagnosis not present

## 2017-09-11 DIAGNOSIS — R6882 Decreased libido: Secondary | ICD-10-CM

## 2017-09-11 DIAGNOSIS — Z923 Personal history of irradiation: Secondary | ICD-10-CM

## 2017-09-11 DIAGNOSIS — Z9221 Personal history of antineoplastic chemotherapy: Secondary | ICD-10-CM | POA: Diagnosis not present

## 2017-09-11 DIAGNOSIS — Z79899 Other long term (current) drug therapy: Secondary | ICD-10-CM | POA: Diagnosis not present

## 2017-09-11 DIAGNOSIS — R232 Flushing: Secondary | ICD-10-CM

## 2017-09-11 DIAGNOSIS — M129 Arthropathy, unspecified: Secondary | ICD-10-CM | POA: Diagnosis not present

## 2017-09-11 DIAGNOSIS — M419 Scoliosis, unspecified: Secondary | ICD-10-CM | POA: Diagnosis not present

## 2017-09-11 DIAGNOSIS — C50411 Malignant neoplasm of upper-outer quadrant of right female breast: Secondary | ICD-10-CM | POA: Insufficient documentation

## 2017-09-11 DIAGNOSIS — K219 Gastro-esophageal reflux disease without esophagitis: Secondary | ICD-10-CM

## 2017-09-11 NOTE — Patient Instructions (Signed)
Bone Health Bones protect organs, store calcium, and anchor muscles. Good health habits, such as eating nutritious foods and exercising regularly, are important for maintaining healthy bones. They can also help to prevent a condition that causes bones to lose density and become weak and brittle (osteoporosis). Why is bone mass important? Bone mass refers to the amount of bone tissue that you have. The higher your bone mass, the stronger your bones. An important step toward having healthy bones throughout life is to have strong and dense bones during childhood. A young adult who has a high bone mass is more likely to have a high bone mass later in life. Bone mass at its greatest it is called peak bone mass. A large decline in bone mass occurs in older adults. In women, it occurs about the time of menopause. During this time, it is important to practice good health habits, because if more bone is lost than what is replaced, the bones will become less healthy and more likely to break (fracture). If you find that you have a low bone mass, you may be able to prevent osteoporosis or further bone loss by changing your diet and lifestyle. How can I find out if my bone mass is low? Bone mass can be measured with an X-ray test that is called a bone mineral density (BMD) test. This test is recommended for all women who are age 65 or older. It may also be recommended for men who are age 70 or older, or for people who are more likely to develop osteoporosis due to:  Having bones that break easily.  Having a long-term disease that weakens bones, such as kidney disease or rheumatoid arthritis.  Having menopause earlier than normal.  Taking medicine that weakens bones, such as steroids, thyroid hormones, or hormone treatment for breast cancer or prostate cancer.  Smoking.  Drinking three or more alcoholic drinks each day.  What are the nutritional recommendations for healthy bones? To have healthy bones, you  need to get enough of the right minerals and vitamins. Most nutrition experts recommend getting these nutrients from the foods that you eat. Nutritional recommendations vary from person to person. Ask your health care provider what is healthy for you. Here are some general guidelines. Calcium Recommendations Calcium is the most important (essential) mineral for bone health. Most people can get enough calcium from their diet, but supplements may be recommended for people who are at risk for osteoporosis. Good sources of calcium include:  Dairy products, such as low-fat or nonfat milk, cheese, and yogurt.  Dark green leafy vegetables, such as bok choy and broccoli.  Calcium-fortified foods, such as orange juice, cereal, bread, soy beverages, and tofu products.  Nuts, such as almonds.  Follow these recommended amounts for daily calcium intake:  Children, age 1?3: 700 mg.  Children, age 4?8: 1,000 mg.  Children, age 9?13: 1,300 mg.  Teens, age 14?18: 1,300 mg.  Adults, age 19?50: 1,000 mg.  Adults, age 51?70: ? Men: 1,000 mg. ? Women: 1,200 mg.  Adults, age 71 or older: 1,200 mg.  Pregnant and breastfeeding females: ? Teens: 1,300 mg. ? Adults: 1,000 mg.  Vitamin D Recommendations Vitamin D is the most essential vitamin for bone health. It helps the body to absorb calcium. Sunlight stimulates the skin to make vitamin D, so be sure to get enough sunlight. If you live in a cold climate or you do not get outside often, your health care provider may recommend that you take vitamin   D supplements. Good sources of vitamin D in your diet include:  Egg yolks.  Saltwater fish.  Milk and cereal fortified with vitamin D.  Follow these recommended amounts for daily vitamin D intake:  Children and teens, age 1?18: 600 international units.  Adults, age 50 or younger: 400-800 international units.  Adults, age 51 or older: 800-1,000 international units.  Other Nutrients Other nutrients  for bone health include:  Phosphorus. This mineral is found in meat, poultry, dairy foods, nuts, and legumes. The recommended daily intake for adult men and adult women is 700 mg.  Magnesium. This mineral is found in seeds, nuts, dark green vegetables, and legumes. The recommended daily intake for adult men is 400?420 mg. For adult women, it is 310?320 mg.  Vitamin K. This vitamin is found in green leafy vegetables. The recommended daily intake is 120 mg for adult men and 90 mg for adult women.  What type of physical activity is best for building and maintaining healthy bones? Weight-bearing and strength-building activities are important for building and maintaining peak bone mass. Weight-bearing activities cause muscles and bones to work against gravity. Strength-building activities increases muscle strength that supports bones. Weight-bearing and muscle-building activities include:  Walking and hiking.  Jogging and running.  Dancing.  Gym exercises.  Lifting weights.  Tennis and racquetball.  Climbing stairs.  Aerobics.  Adults should get at least 30 minutes of moderate physical activity on most days. Children should get at least 60 minutes of moderate physical activity on most days. Ask your health care provide what type of exercise is best for you. Where can I find more information? For more information, check out the following websites:  National Osteoporosis Foundation: http://nof.org/learn/basics  National Institutes of Health: http://www.niams.nih.gov/Health_Info/Bone/Bone_Health/bone_health_for_life.asp  This information is not intended to replace advice given to you by your health care provider. Make sure you discuss any questions you have with your health care provider. Document Released: 07/08/2003 Document Revised: 11/05/2015 Document Reviewed: 04/22/2014 Elsevier Interactive Patient Education  2018 Elsevier Inc.  

## 2017-09-11 NOTE — Progress Notes (Signed)
CLINIC:  Survivorship   REASON FOR VISIT:  Routine follow-up post-treatment for a recent history of breast cancer.  BRIEF ONCOLOGIC HISTORY:    Malignant neoplasm of upper-outer quadrant of right female breast ([name])   02/24/2016 Initial Diagnosis    Right breast biopsy 1:00, grade 2-3 IDC, ER 5%, PR 0%, Ki-67 90%, HER-2 positive ratio 7.28, right breast biopsy 1:00 1.5 cm medial to the dominant mass fibrocystic changes; ultrasound and mammogram 02/23/2016: 2.5 cm mass at 1:00 position and a small 27m irregular mass 1.5 cm medial to the larger mass, T2 N0 stage II a clinical stage      03/04/2016 Breast MRI    2.9 cm irregular enhancing mass located within the right breast at 1:00 position, several T2 bright lesions in the right lobe of the liver incompletely visualized could be hepatic cysts      03/22/2016 - 07/05/2016 Neo-Adjuvant Chemotherapy    Neo-Adj TCHP 5 cycles followed by Herceptin and Perjeta      07/07/2016 Breast MRI    Right breast cancer: No measurable enhancement, residual 1-2 mm foci; left breast partially cystic lesion at 2:30 position 2.1 cm which is new 1 indeterminate left axillary lymph node      08/09/2016 Surgery    Right lumpectomy: 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, no residual invasive cancer, 0/4 lymph nodes negative, ER 5% week, PR negative, HER-2 positive, Ki-67 90%: Complete pathologic response [id number] [id number]      09/11/2016 - 10/09/2016 Radiation Therapy    1) Right breast: 42.5 Gy in 16 fractions  2) Right breast boost: 10 Gy in 4 fractions        01/28/2017 -  Anti-estrogen oral therapy    Anastrozole 1 mg daily (prescribed in July, and started in end of September)       INTERVAL HISTORY:  [facility] to review her survivorship care plan detailing her treatment course for breast cancer, as well as monitoring long-term side effects of that treatment, education regarding health maintenance,  screening, and overall wellness and health promotion.     Overall, Brenda Harding doing well today.  She is taking Anastrozole daily, and does experience some hot flashes she describes as short lived and tolerable.  She says that sometimes that she feels a restlessness inside, and she thinks it may be due in part to the lack of hormones.  She says that she is slightly more anxious than she used to be since she was diagnosed with breast cancer.  She did have a mild panic attack.    She has a decreased libido.  She does experience some vaginal dryness.  She has used Replens, and uses both the replens topically and intravaginally, in addition to premarin twice a week.  This is helping with her ability to have intercourse.  She denies any dyspareunia, however, can only engage in intercourse three times per week.  Any more frequent, and she will experience pain and vaginal bleeding.   Brenda Harding has continued to have occasional loose stools and stool leakage from time to time.  This has been present since treatment, however is continuing to improve.     REVIEW OF SYSTEMS:  Review of Systems  Constitutional: Negative for appetite change, chills, fatigue and unexpected weight change.  HENT:   Negative for hearing loss, lump/mass and trouble swallowing.   Eyes: Negative for eye problems and icterus.  Respiratory: Negative for chest tightness, cough and  shortness of breath.   Cardiovascular: Negative for chest pain, leg swelling and palpitations.  Gastrointestinal: Negative for abdominal distention, abdominal pain, constipation, nausea and vomiting.  Endocrine: Positive for hot flashes.  Skin: Negative for itching and rash.  Neurological: Negative for dizziness, extremity weakness and headaches.  Hematological: Negative for adenopathy. Does not bruise/bleed easily.  Psychiatric/Behavioral: Negative for depression. The patient is nervous/anxious.   Breast: Denies any new nodularity, masses, tenderness,  nipple changes, or nipple discharge.      ONCOLOGY TREATMENT TEAM:  1. Surgeon:  Dr. Barry Dienes at [facility] 2. Medical Oncologist: Dr. Lindi Adie  3. Radiation Oncologist: Dr. Lisbeth Renshaw    PAST MEDICAL/SURGICAL HISTORY:  Past Medical History:  Diagnosis Date  . Anxiety   . Arthritis   . Breast cancer ([place])   . Bronchitis    HISTORY   . Cough productive of purulent sputum    LIGHT GREEN   . Early cataracts, bilateral   . GERD (gastroesophageal reflux disease)   . Headache   . History of wheezing   . Hypertension   . Malignant neoplasm of upper-outer quadrant of right female breast ([place]) 02/25/2016  . Personal history of chemotherapy   . Personal history of radiation therapy   . Scoliosis    Past Surgical History:  Procedure Laterality Date  . BACK SURGERY     scoliosis spinal surgery 1970  . benign tumor removal     1978  . BREAST BIOPSY    . BREAST LUMPECTOMY Right    08/09/2016  . BREAST LUMPECTOMY WITH RADIOACTIVE SEED AND SENTINEL LYMPH NODE BIOPSY Right 08/09/2016   Procedure: RIGHT BREAST LUMPECTOMY WITH RADIOACTIVE SEED AND SENTINEL LYMPH NODE BIOPSY;  Surgeon: [name], MD;  Location: [place];  Service: General;  Laterality: Right;  . CARPAL TUNNEL RELEASE    . PORT-A-CATH REMOVAL Left 03/08/2017   Procedure: REMOVAL PORT-A-CATH;  Surgeon: [name], MD;  Location: [place];  Service: General;  Laterality: Left;  . PORTACATH PLACEMENT Left 03/21/2016   Procedure: INSERTION PORT-A-CATH;  Surgeon: [name], MD;  Location: [place];  Service: General;  Laterality: Left;  . TRIGGER FINGER RELEASE       ALLERGIES:  Allergies  Allergen Reactions  . Propoxyphene Other (See Comments)    Passed out, gets dizzy  . Inapsine [Droperidol]     PATIENT PREFERENCE PATIENT HAS HAD NO REACTION TO THIS Pt does not want to be given this, sister had allergy to this  . Latex Itching  . Levaquin [Levofloxacin] Diarrhea and Nausea And Vomiting     CURRENT  MEDICATIONS:  Outpatient Encounter Medications as of 09/11/2017  Medication Sig  . acetaminophen (TYLENOL) 500 MG tablet Take 500 mg by mouth every 6 (six) hours as needed (for pain.).  Marland Kitchen albuterol (PROVENTIL HFA;VENTOLIN HFA) 108 (90 Base) MCG/ACT inhaler Inhale 2 puffs into the lungs 2 (two) times daily as needed for wheezing or shortness of breath.   . ALPRAZolam (XANAX) 0.5 MG tablet Take 1 tablet (0.5 mg total) by mouth 2 (two) times daily as needed (for anxiety).  Marland Kitchen anastrozole (ARIMIDEX) 1 MG tablet Take 1 tablet (1 mg total) by mouth at bedtime.  . betamethasone dipropionate (DIPROLENE) 0.05 % cream Apply 1 application topically every Monday, Wednesday, and Friday. Applied to vagina  . Biotin 10000 MCG TBDP Take 10,000 mcg by mouth daily.  Marland Kitchen BREO ELLIPTA 200-25 MCG/INH AEPB Inhale 1 puff into the lungs daily.  . chlorpheniramine-HYDROcodone (TUSSIONEX PENNKINETIC ER) 10-8 MG/5ML SUER  Take 5 mLs by mouth at bedtime as needed for cough.  . Cholecalciferol (VITAMIN D3) 5000 units TABS Take 2,500 Units by mouth 2 (two) times a week.  . diphenoxylate-atropine (LOMOTIL) 2.5-0.025 MG tablet Take 1 tablet by mouth 4 (four) times daily as needed for diarrhea or loose stools.  . Garlic 3086 MG CAPS Take 1,000 mg by mouth daily.  Marland Kitchen guaiFENesin (MUCINEX) 600 MG 12 hr tablet Take 600 mg by mouth 2 (two) times daily as needed for cough or to loosen phlegm.   Marland Kitchen ibuprofen (ADVIL,MOTRIN) 800 MG tablet 800 mg every 8 (eight) hours as needed (for pain.).   Marland Kitchen lansoprazole (PREVACID) 30 MG capsule Take 30 mg by mouth daily.   Marland Kitchen levocetirizine (XYZAL) 5 MG tablet Take 5 mg by mouth every evening.  . montelukast (SINGULAIR) 10 MG tablet Take 10 mg by mouth daily.   . Multiple Vitamin (MULTIVITAMIN WITH MINERALS) TABS tablet Take 1 tablet by mouth daily. Centrum Silver  . PREMARIN vaginal cream Place 1 Applicatorful vaginally 2 (two) times a week. Tuesday & Saturday  . saccharomyces boulardii (FLORASTOR) 250 MG  capsule Take 250 mg by mouth daily.  Marland Kitchen spironolactone (ALDACTONE) 50 MG tablet Take 50 mg by mouth 2 (two) times daily.   . Triamcinolone Acetonide (NASACORT ALLERGY 24HR NA) Place 2 sprays into the nose daily.   . verapamil (CALAN-SR) 240 MG CR tablet Take 240 mg by mouth daily.   Facility-Administered Encounter Medications as of 09/11/2017  Medication  . sodium chloride flush (NS) 0.9 % injection 10 mL  . sodium chloride flush (NS) 0.9 % injection 10 mL     ONCOLOGIC FAMILY HISTORY:  Family History  Problem Relation Age of Onset  . Breast cancer Maternal Aunt   . Breast cancer Cousin   . Colon cancer Cousin      GENETIC COUNSELING/TESTING: Not at this time  SOCIAL HISTORY:  Social History   Socioeconomic History  . Marital status: Married    Spouse name: Not on file  . Number of children: Not on file  . Years of education: Not on file  . Highest education level: Not on file  Occupational History  . Not on file  Social Needs  . Financial resource strain: Not on file  . Food insecurity:    Worry: Not on file    Inability: Not on file  . Transportation needs:    Medical: Not on file    Non-medical: Not on file  Tobacco Use  . Smoking status: Never Smoker  . Smokeless tobacco: Never Used  Substance and Sexual Activity  . Alcohol use: No  . Drug use: No  . Sexual activity: Not on file  Lifestyle  . Physical activity:    Days per week: Not on file    Minutes per session: Not on file  . Stress: Not on file  Relationships  . Social connections:    Talks on phone: Not on file    Gets together: Not on file    Attends religious service: Not on file    Active member of club or organization: Not on file    Attends meetings of clubs or organizations: Not on file    Relationship status: Not on file  . Intimate partner violence:    Fear of current or ex partner: Not on file    Emotionally abused: Not on file    Physically abused: Not on file    Forced sexual  activity: Not on file  Other Topics Concern  . Not on file  Social History Narrative  . Not on file     PHYSICAL EXAMINATION:  Vital Signs:   Vitals:   09/11/17 1019  BP: 140/86  Pulse: 73  Resp: 18  Temp: 97.9 F (36.6 C)  SpO2: 100%   Filed Weights   09/11/17 1019  Weight: 170 lb (77.1 kg)   General: Well-nourished, well-appearing female in no acute distress.  She is unaccompanied today.   HEENT: Head is normocephalic.  Pupils equal and reactive to light. Conjunctivae clear without exudate.  Sclerae anicteric. Oral mucosa is pink, moist.  Oropharynx is pink without lesions or erythema.  Lymph: No cervical, supraclavicular, or infraclavicular lymphadenopathy noted on palpation.  Cardiovascular: Regular rate and rhythm.Marland Kitchen Respiratory: Clear to auscultation bilaterally. Chest expansion symmetric; breathing non-labored.  Breasts: right breast s/p lumpectomy with mild radiation changes present.  No nodules, or masses noted.  Left breast without nodules, masses, skin or nipple changes GI: Abdomen soft and round; non-tender, non-distended. Bowel sounds normoactive.  GU: Deferred.  Neuro: No focal deficits. Steady gait.  Psych: Mood and affect normal and appropriate for situation.  Extremities: No edema. MSK: No focal spinal tenderness to palpation.  Full range of motion in bilateral upper extremities Skin: Warm and dry.  LABORATORY DATA:  None for this visit.  DIAGNOSTIC IMAGING:  None for this visit.      ASSESSMENT AND PLAN:  Ms.. Harding is a pleasant 62 y.o. female with Stage IIA right breast invasive ductal carcinoma, ER+/PR-/HER2-, diagnosed in 01/2016, treated with lumpectomy, adjuvant radiation therapy, and anti-estrogen therapy with Anastrozole beginning in 10/2016.  She presents to the Survivorship Clinic for our initial meeting and routine follow-up post-completion of treatment for breast cancer.    1. Stage IIA right breast cancer:  Brenda Harding is continuing to  recover from definitive treatment for breast cancer. She will follow-up with her medical oncologist, Dr. Lindi Adie in one year with history and physical exam per surveillance protocol.  She will continue her anti-estrogen therapy with Anastrozole. Thus far, she is tolerating the Anastrozole well, with minimal side effects. . Today, a comprehensive survivorship care plan and treatment summary was reviewed with the patient today detailing her breast cancer diagnosis, treatment course, potential late/long-term effects of treatment, appropriate follow-up care with recommendations for the future, and patient education resources.  A copy of this summary, along with a letter will be sent to the patient's primary care provider via mail/fax/In Basket message after today's visit.    2. Vaginal dryness: Managed with premarin and replens. We discussed pelvic rehab and mona lisa touch today as well.  She will let me know if she needs any referrals in the future if it continues or worsens.  3. Loose stool/leakage: She and I reviewed the fact that this should continue to improve, as it has been.  She can take an imodium if necessary.  We also discussed Questran.  If it begins to worsen, would consider GI referral.    4. Bone health:  Given Brenda Harding's age/history of breast cancer and her current treatment regimen including anti-estrogen therapy with Anastrozole, she is at risk for bone demineralization. She cannot recall her last DEXA, but has this done with GYN in [place].  She will need this repeated and can have this done there, in order to determine any loss in bone density from previous records, and we can review those results. I let her know that her bone densities need to be  every 2 years from now on. In the meantime, she was encouraged to increase her consumption of foods rich in calcium, as well as increase her weight-bearing activities.  She was given education on specific activities to promote bone health.  5.  Cancer screening:  Due to Brenda Harding's history and her age, she should receive screening for skin cancers, colon cancer, and gynecologic cancers.  The information and recommendations are listed on the patient's comprehensive care plan/treatment summary and were reviewed in detail with the patient.    6. Health maintenance and wellness promotion: Brenda Harding was encouraged to consume 5-7 servings of fruits and vegetables per day. We reviewed the "Nutrition Rainbow" handout, as well as the handout "Take Control of Your Health and Reduce Your Cancer Risk" from the [place].  She was also encouraged to engage in moderate to vigorous exercise for 30 minutes per day most days of the week. We discussed the [employer] fitness program, which is designed for cancer survivors to help them become more physically fit after cancer treatments.  She was instructed to limit her alcohol consumption and continue to abstain from tobacco use.     7. Support services/counseling: It is not uncommon for this period of the patient's cancer care trajectory to be one of many emotions and stressors.  We discussed an opportunity for her to participate in the next session of [facility] ("Finding Your New Normal") support group series designed for patients after they have completed treatment.   Brenda Harding was encouraged to take advantage of our many other support services programs, support groups, and/or counseling in coping with her new life as a cancer survivor after completing anti-cancer treatment.  She was offered support today through active listening and expressive supportive counseling.  She was given information regarding our available services and encouraged to contact me with any questions or for help enrolling in any of our support group/programs.    Dispo:   -Return to cancer center in one year for f/u with Dr. Lindi Adie -Bone density due  -Mammogram due in 03/2018 -Follow up with Dr. Barry Dienes in 01/2018 -She is  welcome to return back to the Survivorship Clinic at any time; no additional follow-up needed at this time.  -Consider referral back to survivorship as a long-term survivor for continued surveillance  A total of (50) minutes of face-to-face time was spent with this patient with greater than 50% of that time in counseling and care-coordination.   Gardenia Phlegm, NP Survivorship Program [facility] [phone]   Note: PRIMARY CARE PROVIDER Tobe Sos, [place] [phone]

## 2017-09-14 IMAGING — CR DG CHEST 2V
2 series · 2 of 2 positions shown · non-contrast
Comparison: 03/21/2016

CLINICAL DATA: Preop for right breast lumpectomy

EXAM:
CHEST  2 VIEW

[w chest pa]
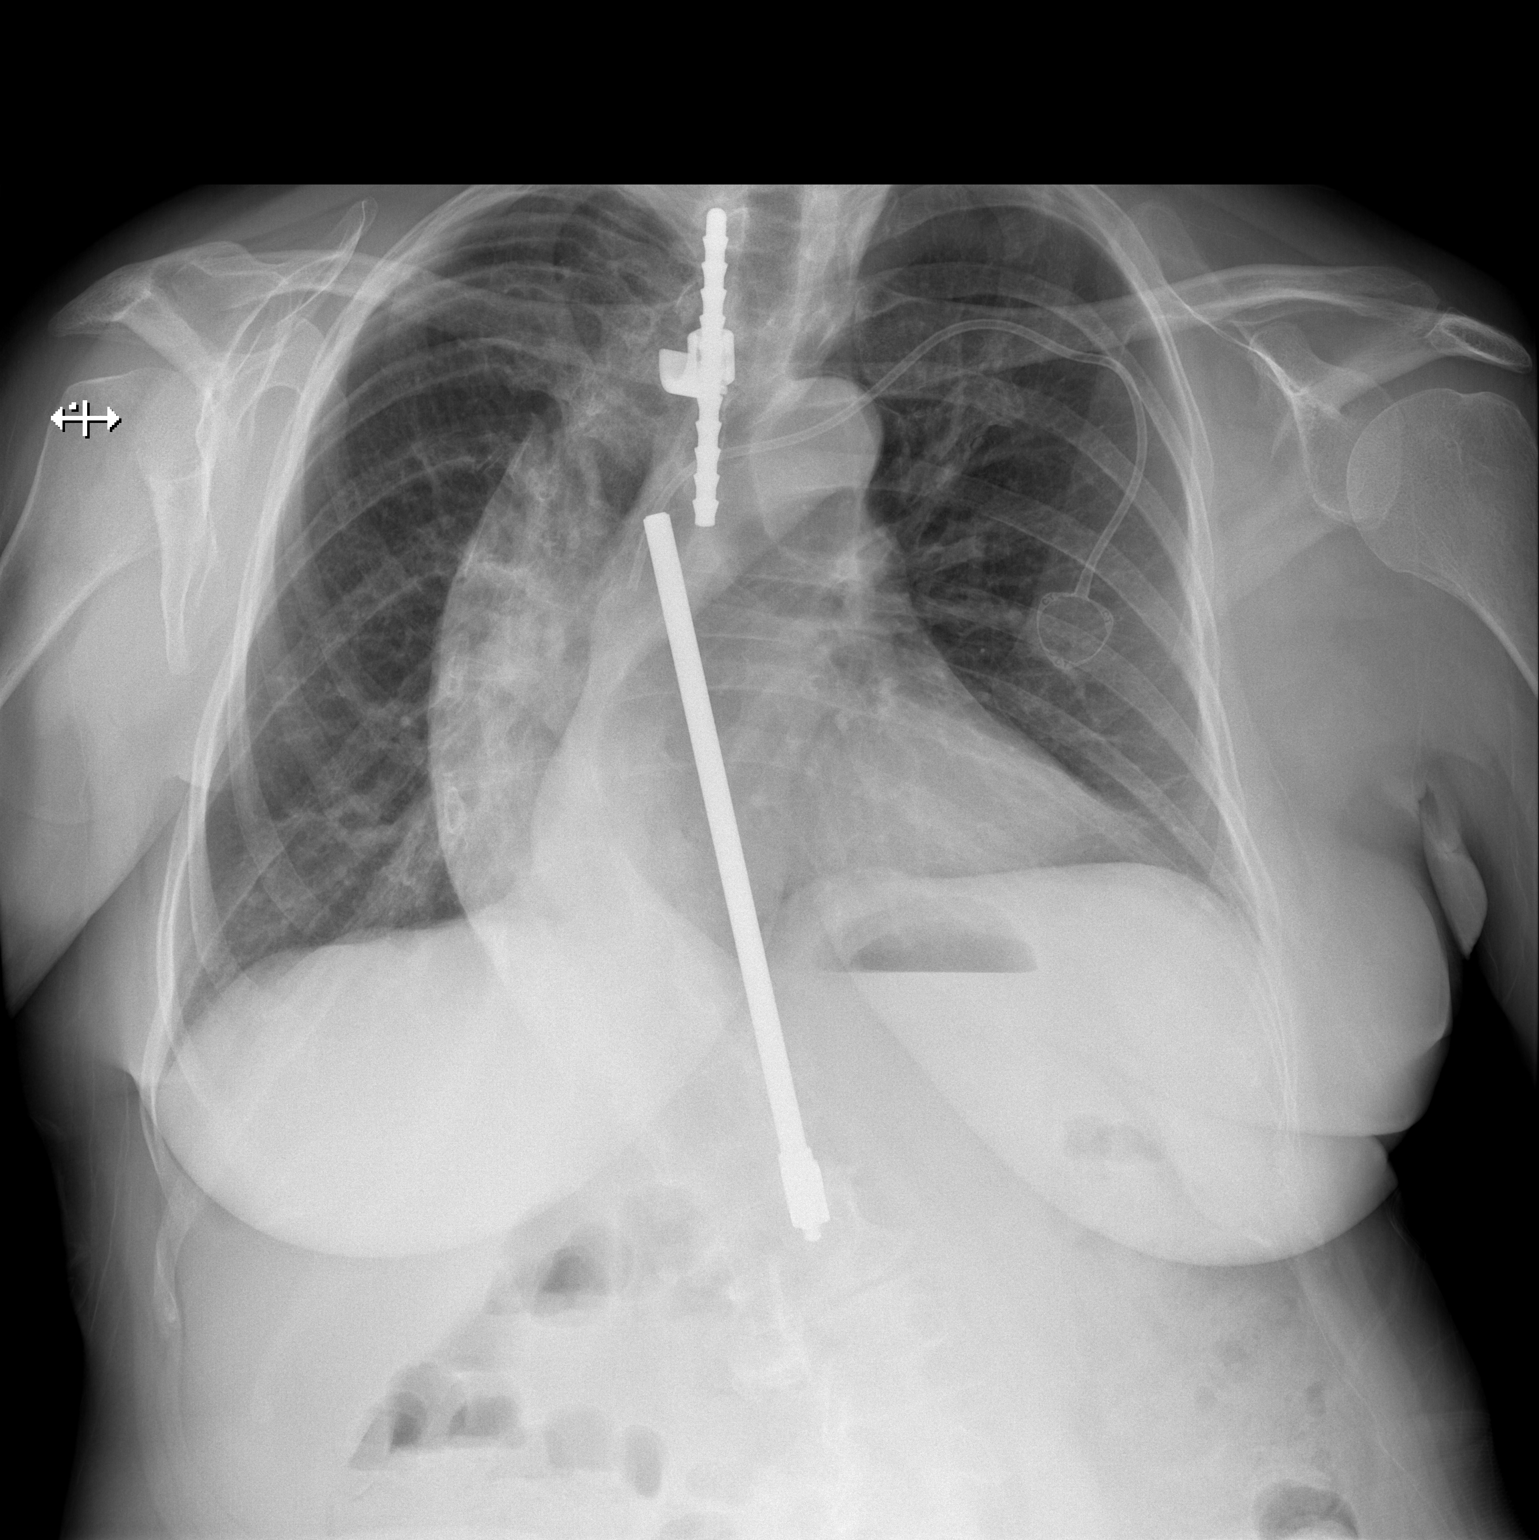

[w chest lat]
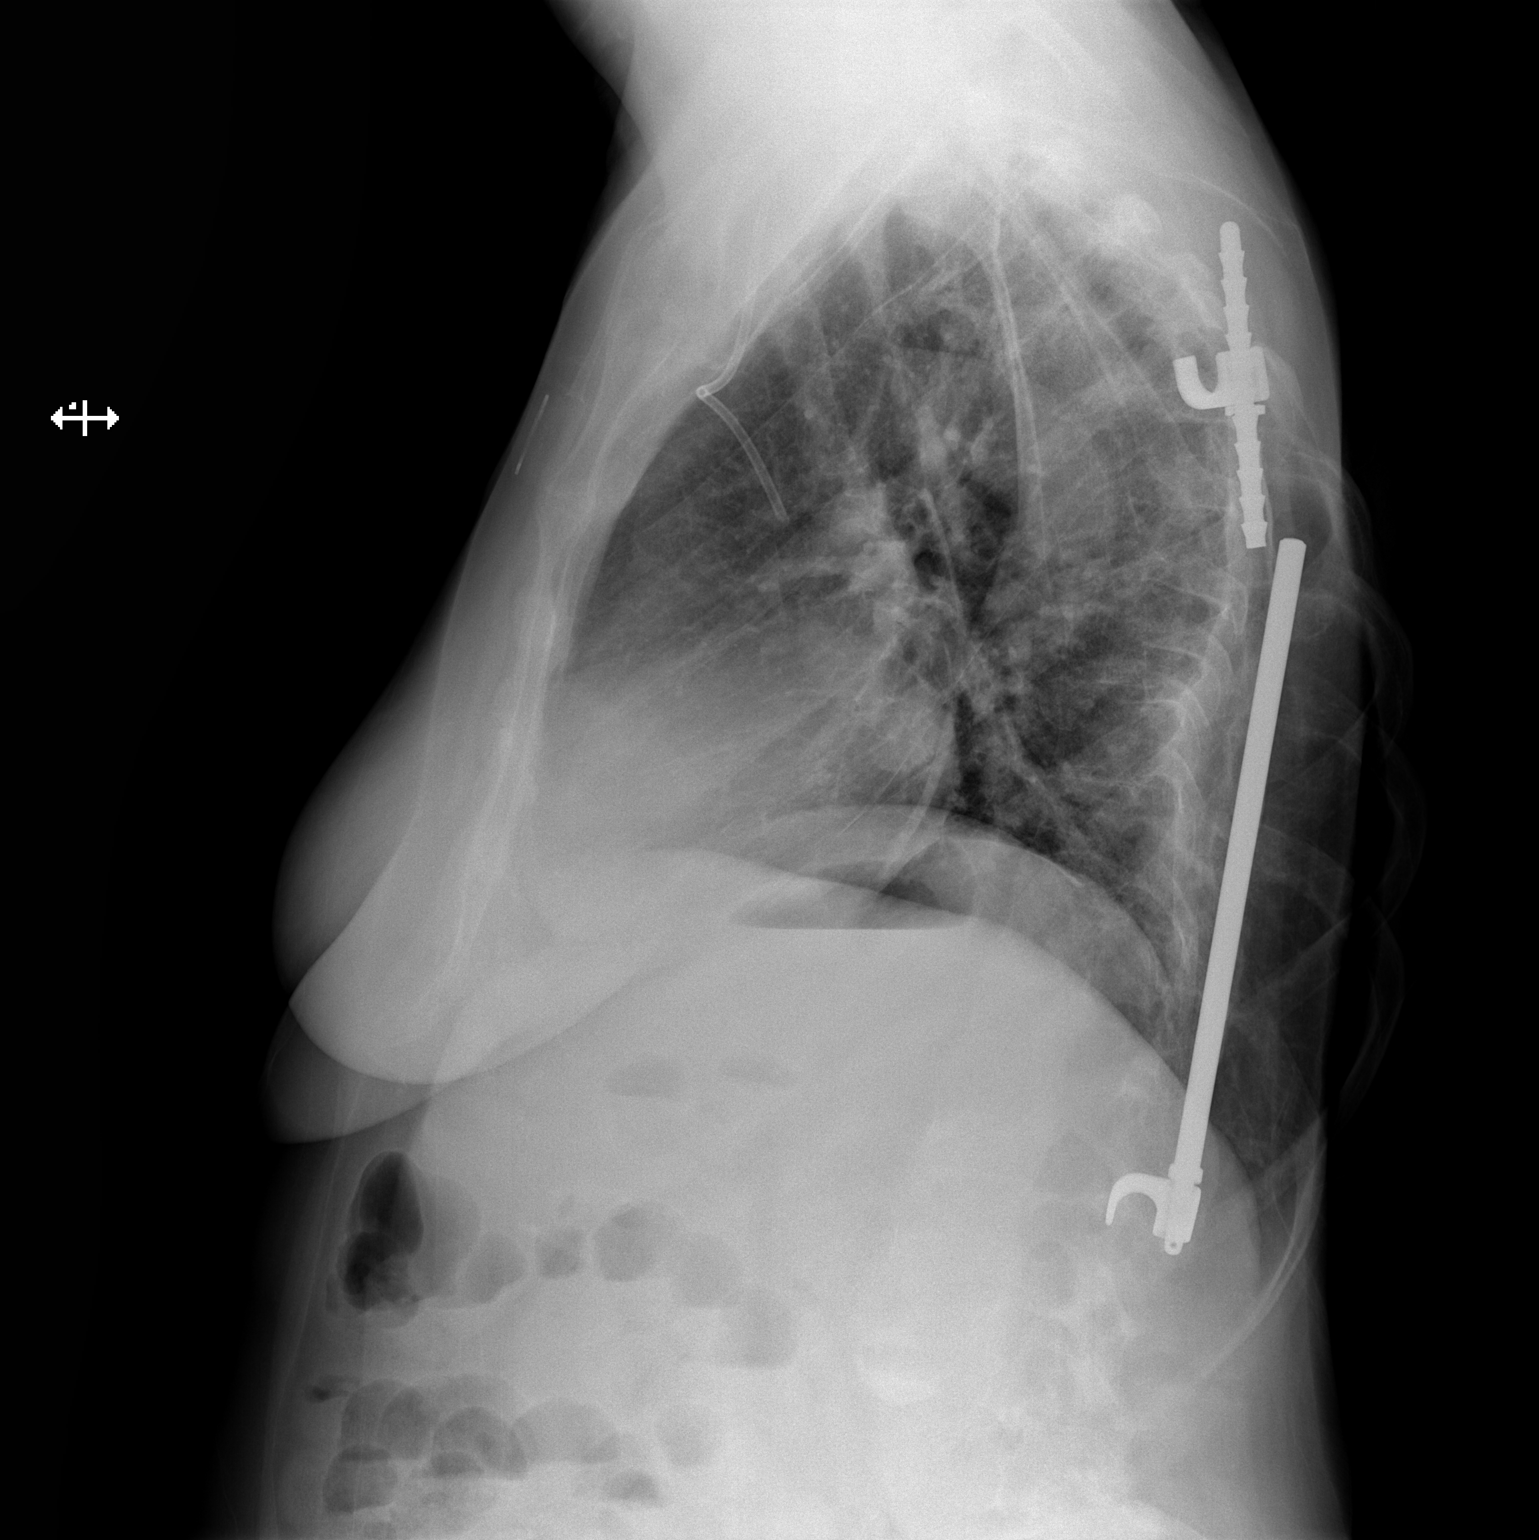

[2 of 2 positions shown; findings below may reference images not displayed]

FINDINGS: Cardiomediastinal silhouette is stable. Left subclavian Port-A-Cath
with tip in SVC. No infiltrate or pulmonary edema. Dextroscoliosis
thoracic spine again noted. Again noted fracture /interruption of
metallic fixation rod mid thoracic spine.
IMPRESSION: No active cardiopulmonary disease.  No significant change.

## 2018-04-10 ENCOUNTER — Other Ambulatory Visit: Payer: Self-pay | Admitting: Hematology and Oncology

## 2018-04-10 DIAGNOSIS — Z9889 Other specified postprocedural states: Secondary | ICD-10-CM

## 2018-04-19 ENCOUNTER — Ambulatory Visit
Admission: RE | Admit: 2018-04-19 | Discharge: 2018-04-19 | Disposition: A | Discharge status: Home / Self Care | Payer: Medicare FFS | Source: Ambulatory Visit | Attending: Hematology and Oncology | Admitting: Hematology and Oncology

## 2018-04-19 DIAGNOSIS — Z9889 Other specified postprocedural states: Secondary | ICD-10-CM

## 2018-07-15 ENCOUNTER — Telehealth: Payer: Self-pay | Admitting: Hematology and Oncology

## 2018-07-15 NOTE — Telephone Encounter (Signed)
Returned call to patient re rescheduling 4/29 f/u. Per patient moved f/u to 5/11.

## 2018-08-28 ENCOUNTER — Ambulatory Visit: Payer: Self-pay | Admitting: Hematology and Oncology

## 2018-09-05 NOTE — Assessment & Plan Note (Signed)
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  Treatment summary: 1. Neoadjuvant chemotherapy with TCH Perjeta 6 cycles followed by Herceptinand Perjetamaintenance for 1 year 03/22/2016- 07/05/2017 2. Breast conserving surgery with sentinel lymph node study: 08/08/2016 3. Followed by adjuvant radiation therapy 09/11/16- 10/09/16 4. 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  Anastrozole toxicities: Patient takes Premarin vaginal cream Mild fatigue Occasional hot flash Vaginal dryness: I discussed with her about MJosph Machotouch  Breast cancer surveillance: Mammograms 04/19/2018: Benign breast density category B  Patient experiencing stress in life which is been aggravating her irritable bowel syndrome causing 4-5 loose stools per day. I discussed with her about stress relaxation techniques  RTCin 1 year for follow-up on antiestrogen therapy.

## 2018-09-06 ENCOUNTER — Other Ambulatory Visit: Payer: Self-pay | Admitting: *Deleted

## 2018-09-06 ENCOUNTER — Telehealth: Payer: Self-pay | Admitting: Hematology and Oncology

## 2018-09-06 DIAGNOSIS — C50411 Malignant neoplasm of upper-outer quadrant of right female breast: Secondary | ICD-10-CM

## 2018-09-06 DIAGNOSIS — Z170 Estrogen receptor positive status [ER+]: Secondary | ICD-10-CM

## 2018-09-06 NOTE — Telephone Encounter (Signed)
Called regarding Webex appointment, patient would prefer this to be walk in visit. Webex appointment has been cancelled.

## 2018-09-07 NOTE — Progress Notes (Signed)
Patient Care Team: Tobe Sos, MD as PCP - General (Internal Medicine) Maisie Fus, MD as Consulting Physician (Obstetrics and Gynecology) [name], MD as Consulting Physician (General Surgery) Nicholas Lose, MD as Consulting Physician (Hematology and Oncology) Kyung Rudd, MD as Consulting Physician (Radiation Oncology) Gardenia Phlegm, NP as Nurse Practitioner (Hematology and Oncology)  DIAGNOSIS:    ICD-10-CM   1. 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%, PR 0%, Ki-67 90%, HER-2 positive ratio 7.28, right breast biopsy 1:00 1.5 cm medial to the dominant mass fibrocystic changes; ultrasound and mammogram 02/23/2016: 2.5 cm mass at 1:00 position and a small 23m irregular mass 1.5 cm medial to the larger mass, T2 N0 stage II a clinical stage    03/04/2016 Breast MRI    2.9 cm irregular enhancing mass located within the right breast at 1:00 position, several T2 bright lesions in the right lobe of the liver incompletely visualized could be hepatic cysts    03/22/2016 - 07/05/2016 Neo-Adjuvant Chemotherapy    Neo-Adj TCHP 5 cycles followed by Herceptin and Perjeta    07/07/2016 Breast MRI    Right breast cancer: No measurable enhancement, residual 1-2 mm foci; left breast partially cystic lesion at 2:30 position 2.1 cm which is new 1 indeterminate left axillary lymph node    08/09/2016 Surgery    Right lumpectomy: 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 with above-mentioned history of right breast cancer treated with neoadjuvant chemotherapy, lumpectomy, radiation, and is currently on antiestrogen therapy with anastrozole. I last saw her a year ago. Her most recent mammogram on 04/19/2018 showed no evidence of malignancy bilaterally. She presents to the clinic today for annual follow-up.  REVIEW OF SYSTEMS:   Constitutional: Denies fevers, chills or abnormal weight loss Eyes: Denies blurriness of vision Ears, nose, mouth, throat, and face: Denies mucositis or sore throat Respiratory: Denies cough, dyspnea or wheezes Cardiovascular: Denies palpitation, chest discomfort Gastrointestinal: Denies nausea, heartburn or change in bowel habits Skin: Denies abnormal skin rashes Lymphatics: Denies new lymphadenopathy or easy bruising Neurological: Denies numbness, tingling or new weaknesses Behavioral/Psych: Mood is stable, no new changes  Extremities: No lower extremity edema Breast: denies any pain or lumps or nodules in either breasts All other systems were reviewed with the patient and are negative.  I have reviewed the past medical history, past surgical history, social history and family history with the patient and they are unchanged from previous note.  ALLERGIES:  is allergic to propoxyphene; inapsine [droperidol]; latex; and levaquin [levofloxacin].  MEDICATIONS:  Current Outpatient Medications  Medication Sig Dispense Refill  . acetaminophen (TYLENOL) 500 MG tablet Take 500 mg by mouth  every 6 (six) hours as needed (for pain.).    Marland Kitchen albuterol (PROVENTIL HFA;VENTOLIN HFA) 108 (90 Base) MCG/ACT inhaler Inhale 2 puffs into the lungs 2 (two) times daily as needed for wheezing or shortness of  breath.     . ALPRAZolam (XANAX) 0.5 MG tablet Take 1 tablet (0.5 mg total) by mouth 2 (two) times daily as needed (for anxiety). 60 tablet 0  . anastrozole (ARIMIDEX) 1 MG tablet Take 1 tablet (1 mg total) by mouth at bedtime. 90 tablet 3  . betamethasone dipropionate (DIPROLENE) 0.05 % cream Apply 1 application topically every Monday, Wednesday, and Friday. Applied to vagina    . Biotin 10000 MCG TBDP Take 10,000 mcg by mouth daily.    Marland Kitchen BREO ELLIPTA 200-25 MCG/INH AEPB Inhale 1 puff into the lungs daily.    . chlorpheniramine-HYDROcodone (TUSSIONEX PENNKINETIC ER) 10-8 MG/5ML SUER Take 5 mLs by mouth at bedtime as needed for cough.    . Cholecalciferol (VITAMIN D3) 5000 units TABS Take 2,500 Units by mouth 2 (two) times a week.    . diphenoxylate-atropine (LOMOTIL) 2.5-0.025 MG tablet Take 1 tablet by mouth 4 (four) times daily as needed for diarrhea or loose stools. 30 tablet 0  . Garlic 7026 MG CAPS Take 1,000 mg by mouth daily.    Marland Kitchen guaiFENesin (MUCINEX) 600 MG 12 hr tablet Take 600 mg by mouth 2 (two) times daily as needed for cough or to loosen phlegm.     Marland Kitchen ibuprofen (ADVIL,MOTRIN) 800 MG tablet 800 mg every 8 (eight) hours as needed (for pain.).     Marland Kitchen lansoprazole (PREVACID) 30 MG capsule Take 30 mg by mouth daily.     Marland Kitchen levocetirizine (XYZAL) 5 MG tablet Take 5 mg by mouth every evening.    . montelukast (SINGULAIR) 10 MG tablet Take 10 mg by mouth daily.     . Multiple Vitamin (MULTIVITAMIN WITH MINERALS) TABS tablet Take 1 tablet by mouth daily. Centrum Silver    . PREMARIN vaginal cream Place 1 Applicatorful vaginally 2 (two) times a week. Tuesday & Saturday    . saccharomyces boulardii (FLORASTOR) 250 MG capsule Take 250 mg by mouth daily.    Marland Kitchen spironolactone (ALDACTONE) 50 MG tablet Take 50 mg by mouth 2 (two) times daily.     . Triamcinolone Acetonide (NASACORT ALLERGY 24HR NA) Place 2 sprays into the nose daily.     . verapamil (CALAN-SR) 240 MG CR tablet Take 240 mg by mouth  daily.     No current facility-administered medications for this visit.    Facility-Administered Medications Ordered in Other Visits  Medication Dose Route Frequency Provider Last Rate Last Dose  . sodium chloride flush (NS) 0.9 % injection 10 mL  10 mL Intravenous PRN Nicholas Lose, MD   10 mL at 09/27/16 1326  . sodium chloride flush (NS) 0.9 % injection 10 mL  10 mL Intracatheter PRN Nicholas Lose, MD   10 mL at 12/25/16 1450    PHYSICAL EXAMINATION: ECOG PERFORMANCE STATUS: 1 - Symptomatic but completely ambulatory  Vitals:   09/09/18 1201  BP: 136/75  Pulse: 89  Resp: 18  SpO2: 98%   Filed Weights   09/09/18 1201  Weight: 176 lb 3.2 oz (79.9 kg)    GENERAL: alert, no distress and comfortable SKIN: skin color, texture, turgor are normal, no rashes or significant lesions EYES: normal, Conjunctiva are pink and non-injected, sclera clear OROPHARYNX: no exudate, no erythema and lips, buccal mucosa, and tongue normal  NECK:  supple, thyroid normal size, non-tender, without nodularity LYMPH: no palpable lymphadenopathy in the cervical, axillary or inguinal LUNGS: clear to auscultation and percussion with normal breathing effort HEART: regular rate & rhythm and no murmurs and no lower extremity edema ABDOMEN: abdomen soft, non-tender and normal bowel sounds MUSCULOSKELETAL: no cyanosis of digits and no clubbing  NEURO: alert & oriented x 3 with fluent speech, no focal motor/sensory deficits EXTREMITIES: No lower extremity edema BREAST: No palpable masses or nodules in either right or left breasts. No palpable axillary supraclavicular or infraclavicular adenopathy no breast tenderness or nipple discharge. (exam performed in the presence of a chaperone)  LABORATORY DATA:  I have reviewed the data as listed CMP Latest Ref Rng & Units 09/09/2018 08/27/2017 03/02/2017  Glucose 70 - 99 mg/dL 83 89 84  BUN 8 - 23 mg/dL '22 19 19  ' Creatinine 0.44 - 1.00 mg/dL 1.28(H) 1.22(H) 1.15(H)   Sodium 135 - 145 mmol/L 140 140 139  Potassium 3.5 - 5.1 mmol/L 4.2 4.0 4.0  Chloride 98 - 111 mmol/L 101 104 105  CO2 22 - 32 mmol/L '29 28 26  ' Calcium 8.9 - 10.3 mg/dL 9.6 9.8 9.4  Total Protein 6.5 - 8.1 g/dL 7.4 7.2 -  Total Bilirubin 0.3 - 1.2 mg/dL 0.5 0.5 -  Alkaline Phos 38 - 126 U/L 120 119 -  AST 15 - 41 U/L 16 17 -  ALT 0 - 44 U/L 17 15 -    Lab Results  Component Value Date   WBC 4.0 09/09/2018   HGB 14.4 09/09/2018   HCT 44.1 09/09/2018   MCV 99.1 09/09/2018   PLT 218 09/09/2018   NEUTROABS 2.1 09/09/2018    ASSESSMENT & PLAN:  Malignant neoplasm of upper-outer quadrant of right female breast ([facility]) Right breast biopsy 1:00, 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  Treatment summary: 1. Neoadjuvant chemotherapy with TCH Perjeta 6 cycles followed by Herceptinand Perjetamaintenance for 1 year 03/22/2016- 07/05/2017 2. Breast conserving surgery with sentinel lymph node study: 08/08/2016 3. Followed by adjuvant radiation therapy 09/11/16- 10/09/16 4. 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  Anastrozole toxicities: Patient takes Premarin vaginal cream Mild  fatigue Occasional hot flash Vaginal dryness: I discussed with her about MJosph Machotouch  Breast cancer surveillance: Mammograms 04/19/2018: Benign breast density category B  Patient experiencing stress in life which is been aggravating her irritable bowel syndrome causing 4-5 loose stools per day. I discussed with her about stress relaxation techniques I renewed her prescription for Xanax. We will try to get a copy of the bone density report from her primary care physician.  RTCin 1 year for follow-up on antiestrogen therapy.   No orders of the defined types were placed in this encounter.  The patient has a good understanding of the overall plan.  she agrees with it. she will call with any problems that may develop before the next visit here.  Nicholas Lose, MD 09/09/2018  Julious Oka Dorshimer am acting as scribe for Dr. Nicholas Lose.  I have reviewed the above documentation for accuracy and completeness, and I agree with the above.

## 2018-09-09 ENCOUNTER — Inpatient Hospital Stay: Payer: Medicare FFS

## 2018-09-09 ENCOUNTER — Inpatient Hospital Stay: Payer: Medicare FFS | Attending: Hematology and Oncology | Admitting: Hematology and Oncology

## 2018-09-09 ENCOUNTER — Other Ambulatory Visit: Payer: Self-pay

## 2018-09-09 DIAGNOSIS — Z79899 Other long term (current) drug therapy: Secondary | ICD-10-CM | POA: Diagnosis not present

## 2018-09-09 DIAGNOSIS — Z170 Estrogen receptor positive status [ER+]: Secondary | ICD-10-CM | POA: Diagnosis not present

## 2018-09-09 DIAGNOSIS — K580 Irritable bowel syndrome with diarrhea: Secondary | ICD-10-CM | POA: Insufficient documentation

## 2018-09-09 DIAGNOSIS — Z79811 Long term (current) use of aromatase inhibitors: Secondary | ICD-10-CM | POA: Diagnosis not present

## 2018-09-09 DIAGNOSIS — R232 Flushing: Secondary | ICD-10-CM | POA: Diagnosis not present

## 2018-09-09 DIAGNOSIS — Z9221 Personal history of antineoplastic chemotherapy: Secondary | ICD-10-CM | POA: Diagnosis not present

## 2018-09-09 DIAGNOSIS — Z923 Personal history of irradiation: Secondary | ICD-10-CM

## 2018-09-09 DIAGNOSIS — R5383 Other fatigue: Secondary | ICD-10-CM

## 2018-09-09 DIAGNOSIS — C50411 Malignant neoplasm of upper-outer quadrant of right female breast: Secondary | ICD-10-CM

## 2018-09-09 LAB — CBC WITH DIFFERENTIAL (CANCER CENTER ONLY)
Abs Immature Granulocytes: 0 10*3/uL (ref 0.00–0.07)
Basophils Absolute: 0 10*3/uL (ref 0.0–0.1)
Basophils Relative: 1 %
Eosinophils Absolute: 0.1 10*3/uL (ref 0.0–0.5)
Eosinophils Relative: 2 %
HCT: 44.1 % (ref 36.0–46.0)
Hemoglobin: 14.4 g/dL (ref 12.0–15.0)
Immature Granulocytes: 0 %
Lymphocytes Relative: 33 %
Lymphs Abs: 1.3 10*3/uL (ref 0.7–4.0)
MCH: 32.4 pg (ref 26.0–34.0)
MCHC: 32.7 g/dL (ref 30.0–36.0)
MCV: 99.1 fL (ref 80.0–100.0)
Monocytes Absolute: 0.4 10*3/uL (ref 0.1–1.0)
Monocytes Relative: 11 %
Neutro Abs: 2.1 10*3/uL (ref 1.7–7.7)
Neutrophils Relative %: 53 %
Platelet Count: 218 10*3/uL (ref 150–400)
RBC: 4.45 MIL/uL (ref 3.87–5.11)
RDW: 12.6 % (ref 11.5–15.5)
WBC Count: 4 10*3/uL (ref 4.0–10.5)
nRBC: 0 % (ref 0.0–0.2)

## 2018-09-09 LAB — CMP (CANCER CENTER ONLY)
ALT: 17 U/L (ref 0–44)
AST: 16 U/L (ref 15–41)
Albumin: 4.3 g/dL (ref 3.5–5.0)
Alkaline Phosphatase: 120 U/L (ref 38–126)
Anion gap: 10 (ref 5–15)
BUN: 22 mg/dL (ref 8–23)
CO2: 29 mmol/L (ref 22–32)
Calcium: 9.6 mg/dL (ref 8.9–10.3)
Chloride: 101 mmol/L (ref 98–111)
Creatinine: 1.28 mg/dL — ABNORMAL HIGH (ref 0.44–1.00)
GFR, Est AFR Am: 52 mL/min — ABNORMAL LOW (ref >60)
GFR, Estimated: 44 mL/min — ABNORMAL LOW (ref >60)
Glucose, Bld: 83 mg/dL (ref 70–99)
Potassium: 4.2 mmol/L (ref 3.5–5.1)
Sodium: 140 mmol/L (ref 135–145)
Total Bilirubin: 0.5 mg/dL (ref 0.3–1.2)
Total Protein: 7.4 g/dL (ref 6.5–8.1)

## 2018-09-09 MED ORDER — ANASTROZOLE 1 MG PO TABS: 1.0000 mg | ORAL_TABLET | Freq: Every day | ORAL | 3 refills | Status: DC

## 2018-09-09 MED ORDER — ALPRAZOLAM 0.5 MG PO TABS: 0.5000 mg | ORAL_TABLET | Freq: Two times a day (BID) | ORAL | 0 refills | Status: AC | PRN

## 2018-09-10 ENCOUNTER — Encounter: Payer: Self-pay | Admitting: Hematology and Oncology

## 2018-09-10 ENCOUNTER — Telehealth: Payer: Self-pay | Admitting: Hematology and Oncology

## 2018-09-10 NOTE — Telephone Encounter (Signed)
Called regarding schedule °

## 2018-10-16 ENCOUNTER — Encounter: Payer: Self-pay | Admitting: Hematology and Oncology

## 2019-03-12 ENCOUNTER — Encounter: Payer: Self-pay | Admitting: Hematology and Oncology

## 2019-03-13 ENCOUNTER — Telehealth: Payer: Self-pay

## 2019-03-13 NOTE — Telephone Encounter (Signed)
Returned [employer] phone call regarding the new breast swelling she has been experiencing. She has an appt with Dr. Lindi Adie next week but denies redness, itching, fever or any infection type symptoms. She was wanting to be reminded of self breast manual lymph drainage so briefly verbally reviewed over phone, pt was able to verbalize correct pressure and skin stretch and requested handout to be resent so emailed this to pt at her request. She knows to call back if any other questions or may need to come back to clinic for further review, but would like to try at home for now as she is fearful of contracting COVID and lives in [place]. Pt verbalizes understanding all.

## 2019-03-17 NOTE — Progress Notes (Signed)
Patient Care Team: Tobe Sos, MD as PCP - General (Internal Medicine) Maisie Fus, MD as Consulting Physician (Obstetrics and Gynecology) [name], MD as Consulting Physician (General Surgery) Nicholas Lose, MD as Consulting Physician (Hematology and Oncology) Kyung Rudd, MD as Consulting Physician (Radiation Oncology) Gardenia Phlegm, NP as Nurse Practitioner (Hematology and Oncology)  DIAGNOSIS:    ICD-10-CM   1. Malignant neoplasm of upper-outer quadrant of right breast in female, estrogen receptor positive ([name])  C50.411    Z17.0     SUMMARY OF ONCOLOGIC HISTORY: Oncology History  Malignant neoplasm of upper-outer quadrant of right female breast ([name])  02/24/2016 Initial Diagnosis   Right breast biopsy 1:00, grade 2-3 IDC, ER 5%, PR 0%, Ki-67 90%, HER-2 positive ratio 7.28, right breast biopsy 1:00 1.5 cm medial to the dominant mass fibrocystic changes; ultrasound and mammogram 02/23/2016: 2.5 cm mass at 1:00 position and a small 29m irregular mass 1.5 cm medial to the larger mass, T2 N0 stage II a clinical stage   03/04/2016 Breast MRI   2.9 cm irregular enhancing mass located within the right breast at 1:00 position, several T2 bright lesions in the right lobe of the liver incompletely visualized could be hepatic cysts   03/22/2016 - 07/05/2016 Neo-Adjuvant Chemotherapy   Neo-Adj TCHP 5 cycles followed by Herceptin and Perjeta   07/07/2016 Breast MRI   Right breast cancer: No measurable enhancement, residual 1-2 mm foci; left breast partially cystic lesion at 2:30 position 2.1 cm which is new 1 indeterminate left axillary lymph node   08/09/2016 Surgery   Right lumpectomy: 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, no residual invasive cancer, 0/4 lymph nodes negative, ER 5% week, PR negative, HER-2 positive, Ki-67 90%: Complete pathologic response ypTis ypN0   09/11/2016 - 10/09/2016 Radiation Therapy   1) Right breast: 42.5 Gy in 16 fractions  2) Right breast boost: 10  Gy in 4 fractions     01/28/2017 -  Anti-estrogen oral therapy   Anastrozole 1 mg daily (prescribed in July, and started in end of September)     CHIEF COMPLIANT: Follow-up of right breast swelling and discomfort  INTERVAL HISTORY: Brenda Harding a 63y.o. with above-mentioned history of right breast cancer treated with neoadjuvant chemotherapy, lumpectomy, radiation, and is currently on antiestrogen therapy with anastrozole. She presents to the clinic today for follow-up of right breast swelling and discomfort.   REVIEW OF SYSTEMS:   Constitutional: Denies fevers, chills or abnormal weight loss Eyes: Denies blurriness of vision Ears, nose, mouth, throat, and face: Denies mucositis or sore throat Respiratory: Denies cough, dyspnea or wheezes Cardiovascular: Denies palpitation, chest discomfort Gastrointestinal: Denies nausea, heartburn or change in bowel habits Skin: Denies abnormal skin rashes Lymphatics: Denies new lymphadenopathy or easy bruising Neurological: Denies numbness, tingling or new weaknesses Behavioral/Psych: Mood is stable, no new changes  Extremities: No lower extremity edema Breast: Complains of right breast swelling and discomfort All other systems were reviewed with the patient and are negative.  I have reviewed the past medical history, past surgical history, social history and family history with the patient and they are unchanged from previous note.  ALLERGIES:  is allergic to propoxyphene; inapsine [droperidol]; latex; and levaquin [levofloxacin].  MEDICATIONS:  Current Outpatient Medications  Medication Sig Dispense Refill  . acetaminophen (TYLENOL) 500 MG tablet Take 500 mg by mouth every 6 (six) hours as needed (for pain.).    .Marland Kitchenalbuterol (PROVENTIL HFA;VENTOLIN HFA) 108 (90 Base) MCG/ACT inhaler Inhale 2 puffs into  the lungs 2 (two) times daily as needed for wheezing or shortness of breath.     . ALPRAZolam (XANAX) 0.5 MG tablet Take 1 tablet  (0.5 mg total) by mouth 2 (two) times daily as needed (for anxiety). 60 tablet 0  . anastrozole (ARIMIDEX) 1 MG tablet Take 1 tablet (1 mg total) by mouth at bedtime. 90 tablet 3  . betamethasone dipropionate (DIPROLENE) 0.05 % cream Apply 1 application topically every Monday, Wednesday, and Friday. Applied to vagina    . Biotin 10000 MCG TBDP Take 10,000 mcg by mouth daily.    Marland Kitchen BREO ELLIPTA 200-25 MCG/INH AEPB Inhale 1 puff into the lungs daily.    . chlorpheniramine-HYDROcodone (TUSSIONEX PENNKINETIC ER) 10-8 MG/5ML SUER Take 5 mLs by mouth at bedtime as needed for cough.    . Cholecalciferol (VITAMIN D3) 5000 units TABS Take 2,500 Units by mouth 2 (two) times a week.    . diphenoxylate-atropine (LOMOTIL) 2.5-0.025 MG tablet Take 1 tablet by mouth 4 (four) times daily as needed for diarrhea or loose stools. 30 tablet 0  . Garlic 4650 MG CAPS Take 1,000 mg by mouth daily.    Marland Kitchen guaiFENesin (MUCINEX) 600 MG 12 hr tablet Take 600 mg by mouth 2 (two) times daily as needed for cough or to loosen phlegm.     Marland Kitchen ibuprofen (ADVIL,MOTRIN) 800 MG tablet 800 mg every 8 (eight) hours as needed (for pain.).     Marland Kitchen lansoprazole (PREVACID) 30 MG capsule Take 30 mg by mouth daily.     Marland Kitchen levocetirizine (XYZAL) 5 MG tablet Take 5 mg by mouth every evening.    . montelukast (SINGULAIR) 10 MG tablet Take 10 mg by mouth daily.     . Multiple Vitamin (MULTIVITAMIN WITH MINERALS) TABS tablet Take 1 tablet by mouth daily. Centrum Silver    . PREMARIN vaginal cream Place 1 Applicatorful vaginally 2 (two) times a week. Tuesday & Saturday    . saccharomyces boulardii (FLORASTOR) 250 MG capsule Take 250 mg by mouth daily.    Marland Kitchen spironolactone (ALDACTONE) 50 MG tablet Take 50 mg by mouth 2 (two) times daily.     . Triamcinolone Acetonide (NASACORT ALLERGY 24HR NA) Place 2 sprays into the nose daily.     . verapamil (CALAN-SR) 240 MG CR tablet Take 240 mg by mouth daily.     No current facility-administered medications for  this visit.    Facility-Administered Medications Ordered in Other Visits  Medication Dose Route Frequency Provider Last Rate Last Dose  . sodium chloride flush (NS) 0.9 % injection 10 mL  10 mL Intravenous PRN Nicholas Lose, MD   10 mL at 09/27/16 1326  . sodium chloride flush (NS) 0.9 % injection 10 mL  10 mL Intracatheter PRN Nicholas Lose, MD   10 mL at 12/25/16 1450    PHYSICAL EXAMINATION: ECOG PERFORMANCE STATUS: 1 - Symptomatic but completely ambulatory  Vitals:   03/18/19 1020  BP: (!) 154/90  Pulse: 94  Resp: 18  Temp: 98.2 F (36.8 C)  SpO2: 98%   Filed Weights   03/18/19 1020  Weight: 177 lb 1.6 oz (80.3 kg)    GENERAL: alert, no distress and comfortable SKIN: skin color, texture, turgor are normal, no rashes or significant lesions EYES: normal, Conjunctiva are pink and non-injected, sclera clear OROPHARYNX: no exudate, no erythema and lips, buccal mucosa, and tongue normal  NECK: supple, thyroid normal size, non-tender, without nodularity LYMPH: no palpable lymphadenopathy in the cervical, axillary or inguinal LUNGS:  clear to auscultation and percussion with normal breathing effort HEART: regular rate & rhythm and no murmurs and no lower extremity edema ABDOMEN: abdomen soft, non-tender and normal bowel sounds MUSCULOSKELETAL: no cyanosis of digits and no clubbing  NEURO: alert & oriented x 3 with fluent speech, no focal motor/sensory deficits EXTREMITIES: No lower extremity edema BREAST: There is no significant swelling or pain or discomfort in the right breast anymore.  Her symptoms have markedly improved.  The cramps underneath the right axilla have also resolved.  (exam performed in the presence of a chaperone)  LABORATORY DATA:  I have reviewed the data as listed CMP Latest Ref Rng & Units 09/09/2018 08/27/2017 03/02/2017  Glucose 70 - 99 mg/dL 83 89 84  BUN 8 - 23 mg/dL '22 19 19  ' Creatinine 0.44 - 1.00 mg/dL 1.28(H) 1.22(H) 1.15(H)  Sodium 135 - 145 mmol/L  140 140 139  Potassium 3.5 - 5.1 mmol/L 4.2 4.0 4.0  Chloride 98 - 111 mmol/L 101 104 105  CO2 22 - 32 mmol/L '29 28 26  ' Calcium 8.9 - 10.3 mg/dL 9.6 9.8 9.4  Total Protein 6.5 - 8.1 g/dL 7.4 7.2 -  Total Bilirubin 0.3 - 1.2 mg/dL 0.5 0.5 -  Alkaline Phos 38 - 126 U/L 120 119 -  AST 15 - 41 U/L 16 17 -  ALT 0 - 44 U/L 17 15 -    Lab Results  Component Value Date   WBC 4.0 09/09/2018   HGB 14.4 09/09/2018   HCT 44.1 09/09/2018   MCV 99.1 09/09/2018   PLT 218 09/09/2018   NEUTROABS 2.1 09/09/2018    ASSESSMENT & PLAN:  Malignant neoplasm of upper-outer quadrant of right female breast ([facility]) Right breast biopsy 1:00, grade 2-3 IDC, ER 5%, PR 0%, Ki-67 90%, HER-2 positive ratio 7.28, right breast biopsy 1:00 1.5 cm medial to the dominant mass fibrocystic changes; ultrasound and mammogram 02/23/2016: 2.5 cm mass at 1:00 position and a small 58m irregular mass 1.5 cm medial to the larger mass, T2 N0 stage II a clinical stage  Breast MRI 03/04/2016: 2.9 cm irregular enhancing mass located within the right breast at 1:00 position, several T2 bright lesions in the right lobe of the liver incompletely visualized could be hepatic cysts.  Treatment summary: 1. Neoadjuvant chemotherapy with TCH Perjeta 6 cycles followed by Herceptinand Perjetamaintenance for 1 year 03/22/2016- 07/05/2017 2. Breast conserving surgery with sentinel lymph node study: 08/08/2016 3. Followed by adjuvant radiation therapy 09/11/16- 10/09/16 4. Followed by antiestrogen therapystarted July 2018  Right lumpectomy 08/08/2016: 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, no residual invasive cancer, 0/4 lymph nodes negative, ER 5% weak, PR negative, HER-2 positive, Ki-67 90%: Complete pathologic response ypTis ypN0 ------------------------------------------------------------------------------- Current Treatment:Anastrozole started July 2018.  Anastrozole toxicities: Patient takes Premarin vaginal cream Mild fatigue Occasional hot  flash Vaginal dryness:I discussed with her about MJosph Machotouch  Breast cancer surveillance: Mammograms 04/19/2018: Benign breast density category B Bone density 12/17/2017: T score -2.7: Osteoporosis Breast exam 03/18/2019: Benign  Right breast swelling and discomfort: This was after heavy exertion.  She has been massaging it and has been stretching it and her symptoms have completely resolved. Dry skin spot on the upper inner aspect of the right breast: We decided to watch and monitor this.  If it gets worse she will see a dermatologist. If her symptoms recur with swelling or discomfort we can refer her to physical therapy once again.  Return to clinic in May at her regularly scheduled follow-up appointment.  No orders of the defined types were placed in this encounter.  The patient has a good understanding of the overall plan. she agrees with it. she will call with any problems that may develop before the next visit here.  Nicholas Lose, MD 03/18/2019  Brenda Harding, am acting as scribe for Dr. Nicholas Lose.  I have reviewed the above documentation for accuracy and completeness, and I agree with the above.

## 2019-03-18 ENCOUNTER — Other Ambulatory Visit: Payer: Self-pay

## 2019-03-18 ENCOUNTER — Inpatient Hospital Stay: Payer: Medicare PPO | Attending: Hematology and Oncology | Admitting: Hematology and Oncology

## 2019-03-18 DIAGNOSIS — Z7951 Long term (current) use of inhaled steroids: Secondary | ICD-10-CM | POA: Insufficient documentation

## 2019-03-18 DIAGNOSIS — Z9221 Personal history of antineoplastic chemotherapy: Secondary | ICD-10-CM | POA: Diagnosis not present

## 2019-03-18 DIAGNOSIS — Z79811 Long term (current) use of aromatase inhibitors: Secondary | ICD-10-CM | POA: Insufficient documentation

## 2019-03-18 DIAGNOSIS — Z170 Estrogen receptor positive status [ER+]: Secondary | ICD-10-CM | POA: Diagnosis not present

## 2019-03-18 DIAGNOSIS — Z923 Personal history of irradiation: Secondary | ICD-10-CM | POA: Insufficient documentation

## 2019-03-18 DIAGNOSIS — Z791 Long term (current) use of non-steroidal anti-inflammatories (NSAID): Secondary | ICD-10-CM | POA: Diagnosis not present

## 2019-03-18 DIAGNOSIS — C50411 Malignant neoplasm of upper-outer quadrant of right female breast: Secondary | ICD-10-CM | POA: Diagnosis present

## 2019-03-18 DIAGNOSIS — Z79899 Other long term (current) drug therapy: Secondary | ICD-10-CM | POA: Diagnosis not present

## 2019-03-18 NOTE — Assessment & Plan Note (Signed)
Right breast biopsy 1:00, grade 2-3 IDC, ER 5%, PR 0%, Ki-67 90%, HER-2 positive ratio 7.28, right breast biopsy 1:00 1.5 cm medial to the dominant mass fibrocystic changes; ultrasound and mammogram 02/23/2016: 2.5 cm mass at 1:00 position and a small 42m irregular mass 1.5 cm medial to the larger mass, T2 N0 stage II a clinical stage  Breast MRI 03/04/2016: 2.9 cm irregular enhancing mass located within the right breast at 1:00 position, several T2 bright lesions in the right lobe of the liver incompletely visualized could be hepatic cysts.  Treatment summary: 1. Neoadjuvant chemotherapy with TCH Perjeta 6 cycles followed by Herceptinand Perjetamaintenance for 1 year 03/22/2016- 07/05/2017 2. Breast conserving surgery with sentinel lymph node study: 08/08/2016 3. Followed by adjuvant radiation therapy 09/11/16- 10/09/16 4. Followed by antiestrogen therapystarted July 2018  Right lumpectomy 08/08/2016: 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, no residual invasive cancer, 0/4 lymph nodes negative, ER 5% weak, PR negative, HER-2 positive, Ki-67 90%: Complete pathologic response ypTis ypN0 ------------------------------------------------------------------------------- Current Treatment:Anastrozole started July 2018.  Anastrozole toxicities: Patient takes Premarin vaginal cream Mild fatigue Occasional hot flash Vaginal dryness:I discussed with her about MJosph Machotouch  Breast cancer surveillance: Mammograms 04/19/2018: Benign breast density category B Bone density 12/17/2017: T score -2.7: Osteoporosis Breast exam 03/18/2019: Benign  Patient experiencing stress in life which is been aggravating her irritable bowel syndrome causing 4-5 loose stools per day.  I renewed her prescription for Xanax.   RTCin1 yearfor follow-up on antiestrogen therapy.

## 2019-04-08 ENCOUNTER — Other Ambulatory Visit: Payer: Self-pay | Admitting: Hematology and Oncology

## 2019-04-08 DIAGNOSIS — Z9889 Other specified postprocedural states: Secondary | ICD-10-CM

## 2019-05-06 ENCOUNTER — Other Ambulatory Visit: Payer: Self-pay

## 2019-05-06 ENCOUNTER — Ambulatory Visit
Admission: RE | Admit: 2019-05-06 | Discharge: 2019-05-06 | Disposition: A | Discharge status: Home / Self Care | Payer: Medicare PPO | Source: Ambulatory Visit | Attending: Hematology and Oncology | Admitting: Hematology and Oncology

## 2019-05-06 DIAGNOSIS — Z9889 Other specified postprocedural states: Secondary | ICD-10-CM

## 2019-05-18 ENCOUNTER — Encounter: Payer: Self-pay | Admitting: Hematology and Oncology

## 2019-08-22 ENCOUNTER — Encounter: Payer: Self-pay | Admitting: Hematology and Oncology

## 2019-08-22 ENCOUNTER — Other Ambulatory Visit: Payer: Self-pay

## 2019-08-22 DIAGNOSIS — C50411 Malignant neoplasm of upper-outer quadrant of right female breast: Secondary | ICD-10-CM

## 2019-08-22 DIAGNOSIS — R5383 Other fatigue: Secondary | ICD-10-CM

## 2019-08-22 DIAGNOSIS — Z170 Estrogen receptor positive status [ER+]: Secondary | ICD-10-CM

## 2019-09-08 NOTE — Progress Notes (Signed)
Patient Care Team: Tobe Sos, MD as PCP - General (Internal Medicine) Maisie Fus, MD as Consulting Physician (Obstetrics and Gynecology) [name], MD as Consulting Physician (General Surgery) Nicholas Lose, MD as Consulting Physician (Hematology and Oncology) Kyung Rudd, MD as Consulting Physician (Radiation Oncology) Gardenia Phlegm, NP as Nurse Practitioner (Hematology and Oncology)  DIAGNOSIS:    ICD-10-CM   1. Malignant neoplasm of upper-outer quadrant of right breast in female, estrogen receptor positive ([place])  C50.411    Z17.0     SUMMARY OF ONCOLOGIC HISTORY: Oncology History  Malignant neoplasm of upper-outer quadrant of right female breast ([place])  02/24/2016 Initial Diagnosis   Right breast biopsy 1:00, grade 2-3 IDC, ER 5%, PR 0%, Ki-67 90%, HER-2 positive ratio 7.28, right breast biopsy 1:00 1.5 cm medial to the dominant mass fibrocystic changes; ultrasound and mammogram 02/23/2016: 2.5 cm mass at 1:00 position and a small 40m irregular mass 1.5 cm medial to the larger mass, T2 N0 stage II a clinical stage   03/04/2016 Breast MRI   2.9 cm irregular enhancing mass located within the right breast at 1:00 position, several T2 bright lesions in the right lobe of the liver incompletely visualized could be hepatic cysts   03/22/2016 - 07/05/2016 Neo-Adjuvant Chemotherapy   Neo-Adj TCHP 5 cycles followed by Herceptin and Perjeta   07/07/2016 Breast MRI   Right breast cancer: No measurable enhancement, residual 1-2 mm foci; left breast partially cystic lesion at 2:30 position 2.1 cm which is new 1 indeterminate left axillary lymph node   08/09/2016 Surgery   Right lumpectomy: 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, no residual invasive cancer, 0/4 lymph nodes negative, ER 5% week, PR negative, HER-2 positive, Ki-67 90%: Complete pathologic response ypTis ypN0   09/11/2016 - 10/09/2016 Radiation Therapy   1) Right breast: 42.5 Gy in 16 fractions  2) Right breast boost: 10  Gy in 4 fractions     01/28/2017 -  Anti-estrogen oral therapy   Anastrozole 1 mg daily (prescribed in July, and started in end of September)     CHIEF COMPLIANT: Follow-up of right breast cancer on anastrozole   INTERVAL HISTORY: Brenda Falkneris a 64y.o. with above-mentioned history of right breast cancer treated with neoadjuvant chemotherapy, lumpectomy, radiation, and is currently on antiestrogen therapy with anastrozole. Mammogram on 05/06/19 showed no evidence of malignancy bilaterally. She presents to the clinic today for follow-up.    ALLERGIES:  is allergic to propoxyphene; inapsine [droperidol]; latex; and levaquin [levofloxacin].  MEDICATIONS:  Current Outpatient Medications  Medication Sig Dispense Refill  . acetaminophen (TYLENOL) 500 MG tablet Take 500 mg by mouth every 6 (six) hours as needed (for pain.).    .Marland Kitchenalbuterol (PROVENTIL HFA;VENTOLIN HFA) 108 (90 Base) MCG/ACT inhaler Inhale 2 puffs into the lungs 2 (two) times daily as needed for wheezing or shortness of breath.     . ALPRAZolam (XANAX) 0.5 MG tablet Take 1 tablet (0.5 mg total) by mouth 2 (two) times daily as needed (for anxiety). 60 tablet 0  . anastrozole (ARIMIDEX) 1 MG tablet Take 1 tablet (1 mg total) by mouth at bedtime. 90 tablet 3  . betamethasone dipropionate (DIPROLENE) 0.05 % cream Apply 1 application topically every Monday, Wednesday, and Friday. Applied to vagina    . Biotin 10000 MCG TBDP Take 10,000 mcg by mouth daily.    .Marland KitchenBREO ELLIPTA 200-25 MCG/INH AEPB Inhale 1 puff into the lungs daily.    . chlorpheniramine-HYDROcodone (TUSSIONEX PENNKINETIC ER) 10-8  MG/5ML SUER Take 5 mLs by mouth at bedtime as needed for cough.    . Cholecalciferol (VITAMIN D3) 5000 units TABS Take 2,500 Units by mouth 2 (two) times a week.    . diphenoxylate-atropine (LOMOTIL) 2.5-0.025 MG tablet Take 1 tablet by mouth 4 (four) times daily as needed for diarrhea or loose stools. 30 tablet 0  . Garlic 7544 MG CAPS Take  1,000 mg by mouth daily.    Marland Kitchen guaiFENesin (MUCINEX) 600 MG 12 hr tablet Take 600 mg by mouth 2 (two) times daily as needed for cough or to loosen phlegm.     Marland Kitchen ibuprofen (ADVIL,MOTRIN) 800 MG tablet 800 mg every 8 (eight) hours as needed (for pain.).     Marland Kitchen lansoprazole (PREVACID) 30 MG capsule Take 30 mg by mouth daily.     Marland Kitchen levocetirizine (XYZAL) 5 MG tablet Take 5 mg by mouth every evening.    . montelukast (SINGULAIR) 10 MG tablet Take 10 mg by mouth daily.     . Multiple Vitamin (MULTIVITAMIN WITH MINERALS) TABS tablet Take 1 tablet by mouth daily. Centrum Silver    . PREMARIN vaginal cream Place 1 Applicatorful vaginally 2 (two) times a week. Tuesday & Saturday    . saccharomyces boulardii (FLORASTOR) 250 MG capsule Take 250 mg by mouth daily.    Marland Kitchen spironolactone (ALDACTONE) 50 MG tablet Take 50 mg by mouth 2 (two) times daily.     . Triamcinolone Acetonide (NASACORT ALLERGY 24HR NA) Place 2 sprays into the nose daily.     . verapamil (CALAN-SR) 240 MG CR tablet Take 240 mg by mouth daily.     No current facility-administered medications for this visit.   Facility-Administered Medications Ordered in Other Visits  Medication Dose Route Frequency Provider Last Rate Last Admin  . sodium chloride flush (NS) 0.9 % injection 10 mL  10 mL Intravenous PRN Nicholas Lose, MD   10 mL at 09/27/16 1326  . sodium chloride flush (NS) 0.9 % injection 10 mL  10 mL Intracatheter PRN Nicholas Lose, MD   10 mL at 12/25/16 1450    PHYSICAL EXAMINATION: ECOG PERFORMANCE STATUS: 1 - Symptomatic but completely ambulatory  Vitals:   09/09/19 1059  BP: 123/73  Pulse: 72  Resp: 18  Temp: 98.9 F (37.2 C)  SpO2: 100%   Filed Weights   09/09/19 1059  Weight: 176 lb 1.6 oz (79.9 kg)    BREAST: No palpable masses or nodules in either right or left breasts. No palpable axillary supraclavicular or infraclavicular adenopathy no breast tenderness or nipple discharge. (exam performed in the presence of a  chaperone)  LABORATORY DATA:  I have reviewed the data as listed CMP Latest Ref Rng & Units 09/09/2018 08/27/2017 03/02/2017  Glucose 70 - 99 mg/dL 83 89 84  BUN 8 - 23 mg/dL '22 19 19  ' Creatinine 0.44 - 1.00 mg/dL 1.28(H) 1.22(H) 1.15(H)  Sodium 135 - 145 mmol/L 140 140 139  Potassium 3.5 - 5.1 mmol/L 4.2 4.0 4.0  Chloride 98 - 111 mmol/L 101 104 105  CO2 22 - 32 mmol/L '29 28 26  ' Calcium 8.9 - 10.3 mg/dL 9.6 9.8 9.4  Total Protein 6.5 - 8.1 g/dL 7.4 7.2 -  Total Bilirubin 0.3 - 1.2 mg/dL 0.5 0.5 -  Alkaline Phos 38 - 126 U/L 120 119 -  AST 15 - 41 U/L 16 17 -  ALT 0 - 44 U/L 17 15 -    Lab Results  Component Value Date  WBC 5.2 09/09/2019   HGB 12.4 09/09/2019   HCT 37.5 09/09/2019   MCV 100.3 (H) 09/09/2019   PLT 220 09/09/2019   NEUTROABS 3.0 09/09/2019    ASSESSMENT & PLAN:  Malignant neoplasm of upper-outer quadrant of right female breast ([place]) Malignant neoplasm of upper-outer quadrant of right female breast ([place]) Right breast biopsy 1:00, grade 2-3 IDC, ER 5%, PR 0%, Ki-67 90%, HER-2 positive ratio 7.28, right breast biopsy 1:00 1.5 cm medial to the dominant mass fibrocystic changes; ultrasound and mammogram 02/23/2016: 2.5 cm mass at 1:00 position and a small 22m irregular mass 1.5 cm medial to the larger mass, T2 N0 stage II a clinical stage  Treatment summary: 1. Neoadjuvant chemotherapy with TCH Perjeta 6 cycles followed by Herceptinand Perjetamaintenance for 1 year 03/22/2016- 07/05/2017 2. Breast conserving surgery with sentinel lymph node study: 08/08/2016 3. Followed by adjuvant radiation therapy 09/11/16- 10/09/16 4. Followed by antiestrogen therapystarted July 2018  Right lumpectomy 08/08/2016: 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, no residual invasive cancer, 0/4 lymph nodes negative, ER 5% weak, PR negative, HER-2 positive, Ki-67 90%: Complete pathologic response ypTis ypN0 ------------------------------------------------------------------------------- Current  Treatment:Anastrozole started July 2018.  Anastrozole toxicities: Patient takes Premarin vaginal cream Mild fatigue Occasional hot flash Vaginal dryness: Improved with vaginal creams Joint stiffness: Taking turmeric which is helping  Breast cancer surveillance: Mammograms12/20/2019: Benign breast density category B Bone density 12/17/2017: T score -2.7: Osteoporosis Breast exam 09/09/2019: Benign  Right breast swelling discomfort: Subsided  Return to clinic in 1 year for follow-up    No orders of the defined types were placed in this encounter.  The patient has a good understanding of the overall plan. she agrees with it. she will call with any problems that may develop before the next visit here.  Total time spent: 20 mins including face to face time and time spent for planning, charting and coordination of care  GNicholas Lose MD 09/09/2019  I, MCloyde ReamsDorshimer, am acting as scribe for Dr. VNicholas Lose  I have reviewed the above documentation for accuracy and completeness, and I agree with the above.

## 2019-09-09 ENCOUNTER — Inpatient Hospital Stay: Payer: Medicare FFS | Attending: Hematology and Oncology | Admitting: Hematology and Oncology

## 2019-09-09 ENCOUNTER — Inpatient Hospital Stay: Payer: Medicare FFS

## 2019-09-09 ENCOUNTER — Other Ambulatory Visit: Payer: Self-pay

## 2019-09-09 DIAGNOSIS — M2560 Stiffness of unspecified joint, not elsewhere classified: Secondary | ICD-10-CM | POA: Insufficient documentation

## 2019-09-09 DIAGNOSIS — Z170 Estrogen receptor positive status [ER+]: Secondary | ICD-10-CM

## 2019-09-09 DIAGNOSIS — C50411 Malignant neoplasm of upper-outer quadrant of right female breast: Secondary | ICD-10-CM

## 2019-09-09 DIAGNOSIS — Z79811 Long term (current) use of aromatase inhibitors: Secondary | ICD-10-CM | POA: Insufficient documentation

## 2019-09-09 DIAGNOSIS — Z923 Personal history of irradiation: Secondary | ICD-10-CM | POA: Diagnosis not present

## 2019-09-09 DIAGNOSIS — Z9221 Personal history of antineoplastic chemotherapy: Secondary | ICD-10-CM | POA: Diagnosis not present

## 2019-09-09 DIAGNOSIS — R5383 Other fatigue: Secondary | ICD-10-CM

## 2019-09-09 LAB — CBC WITH DIFFERENTIAL (CANCER CENTER ONLY)
Abs Immature Granulocytes: 0.01 10*3/uL (ref 0.00–0.07)
Basophils Absolute: 0 10*3/uL (ref 0.0–0.1)
Basophils Relative: 1 %
Eosinophils Absolute: 0.1 10*3/uL (ref 0.0–0.5)
Eosinophils Relative: 1 %
HCT: 37.5 % (ref 36.0–46.0)
Hemoglobin: 12.4 g/dL (ref 12.0–15.0)
Immature Granulocytes: 0 %
Lymphocytes Relative: 29 %
Lymphs Abs: 1.5 10*3/uL (ref 0.7–4.0)
MCH: 33.2 pg (ref 26.0–34.0)
MCHC: 33.1 g/dL (ref 30.0–36.0)
MCV: 100.3 fL — ABNORMAL HIGH (ref 80.0–100.0)
Monocytes Absolute: 0.6 10*3/uL (ref 0.1–1.0)
Monocytes Relative: 11 %
Neutro Abs: 3 10*3/uL (ref 1.7–7.7)
Neutrophils Relative %: 58 %
Platelet Count: 220 10*3/uL (ref 150–400)
RBC: 3.74 MIL/uL — ABNORMAL LOW (ref 3.87–5.11)
RDW: 12.4 % (ref 11.5–15.5)
WBC Count: 5.2 10*3/uL (ref 4.0–10.5)
nRBC: 0 % (ref 0.0–0.2)

## 2019-09-09 LAB — IRON AND TIBC
Iron: 157 ug/dL — ABNORMAL HIGH (ref 41–142)
Saturation Ratios: 46 % (ref 21–57)
TIBC: 343 ug/dL (ref 236–444)
UIBC: 185 ug/dL (ref 120–384)

## 2019-09-09 LAB — FERRITIN: Ferritin: 127 ng/mL (ref 11–307)

## 2019-09-09 MED ORDER — VALSARTAN-HYDROCHLOROTHIAZIDE 160-12.5 MG PO TABS
1.0000 | ORAL_TABLET | Freq: Every day | ORAL | Status: DC
Start: 2019-09-09 — End: 2021-07-19

## 2019-09-09 MED ORDER — ROSUVASTATIN CALCIUM 10 MG PO TABS: 10.0000 mg | ORAL_TABLET | Freq: Every day | ORAL | Status: AC

## 2019-09-09 MED ORDER — ANASTROZOLE 1 MG PO TABS: 1.0000 mg | ORAL_TABLET | Freq: Every day | ORAL | 3 refills | Status: DC

## 2019-09-09 MED ORDER — TURMERIC 500 MG PO CAPS: 500.0000 mg | ORAL_CAPSULE | Freq: Every day | ORAL | Status: DC

## 2019-09-09 MED ORDER — VERAPAMIL HCL ER 180 MG PO TBCR: 360.0000 mg | EXTENDED_RELEASE_TABLET | Freq: Every day | ORAL | Status: DC

## 2019-09-09 NOTE — Assessment & Plan Note (Signed)
Malignant neoplasm of upper-outer quadrant of right female breast ([place]) Right breast biopsy 1:00, grade 2-3 IDC, ER 5%, PR 0%, Ki-67 90%, HER-2 positive ratio 7.28, right breast biopsy 1:00 1.5 cm medial to the dominant mass fibrocystic changes; ultrasound and mammogram 02/23/2016: 2.5 cm mass at 1:00 position and a small 67m irregular mass 1.5 cm medial to the larger mass, T2 N0 stage II a clinical stage  Treatment summary: 1. Neoadjuvant chemotherapy with TCH Perjeta 6 cycles followed by Herceptinand Perjetamaintenance for 1 year 03/22/2016- 07/05/2017 2. Breast conserving surgery with sentinel lymph node study: 08/08/2016 3. Followed by adjuvant radiation therapy 09/11/16- 10/09/16 4. Followed by antiestrogen therapystarted July 2018  Right lumpectomy 08/08/2016: 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, no residual invasive cancer, 0/4 lymph nodes negative, ER 5% weak, PR negative, HER-2 positive, Ki-67 90%: Complete pathologic response ypTis ypN0 ------------------------------------------------------------------------------- Current Treatment:Anastrozole started July 2018.  Anastrozole toxicities: Patient takes Premarin vaginal cream Mild fatigue Occasional hot flash Vaginal dryness:I discussed with her about MJosph Machotouch  Breast cancer surveillance: Mammograms12/20/2019: Benign breast density category B Bone density 12/17/2017: T score -2.7: Osteoporosis Breast exam 09/09/2019: Benign  Right breast swelling discomfort:  Return to clinic in 1 year for follow-up

## 2019-09-11 ENCOUNTER — Telehealth: Payer: Self-pay | Admitting: Hematology and Oncology

## 2019-09-11 NOTE — Telephone Encounter (Signed)
Scheduled per 05/11 los, patient has been called and notified.

## 2019-12-09 ENCOUNTER — Telehealth: Payer: Self-pay | Admitting: Hematology and Oncology

## 2019-12-09 NOTE — Telephone Encounter (Signed)
Rescheduled appointment per 8/5 provider message. Left message with updated appointment date and time.

## 2020-05-06 ENCOUNTER — Other Ambulatory Visit: Payer: Self-pay | Admitting: Hematology and Oncology

## 2020-05-06 DIAGNOSIS — Z1231 Encounter for screening mammogram for malignant neoplasm of breast: Secondary | ICD-10-CM

## 2020-06-08 ENCOUNTER — Other Ambulatory Visit: Payer: Self-pay | Admitting: Hematology and Oncology

## 2020-06-08 DIAGNOSIS — Z853 Personal history of malignant neoplasm of breast: Secondary | ICD-10-CM

## 2020-06-11 ENCOUNTER — Other Ambulatory Visit: Payer: Self-pay

## 2020-06-11 ENCOUNTER — Ambulatory Visit
Admission: RE | Admit: 2020-06-11 | Discharge: 2020-06-11 | Disposition: A | Discharge status: Home / Self Care | Payer: Medicare FFS | Source: Ambulatory Visit | Attending: Hematology and Oncology | Admitting: Hematology and Oncology

## 2020-06-11 DIAGNOSIS — Z853 Personal history of malignant neoplasm of breast: Secondary | ICD-10-CM

## 2020-08-10 ENCOUNTER — Telehealth: Payer: Self-pay | Admitting: Hematology and Oncology

## 2020-08-10 NOTE — Telephone Encounter (Signed)
Called pt to r/s per 4/11 sch msg. No answer. Left msg for pt to call back to r/s.

## 2020-08-24 ENCOUNTER — Telehealth: Payer: Self-pay | Admitting: Hematology and Oncology

## 2020-08-24 NOTE — Telephone Encounter (Signed)
R/s appt per 4/26 sch msg. Pt aware.  

## 2020-08-27 ENCOUNTER — Telehealth: Payer: Self-pay | Admitting: Hematology and Oncology

## 2020-08-27 NOTE — Telephone Encounter (Signed)
Rescheduled per 4/29 email. Called pt and left a msg

## 2020-09-06 ENCOUNTER — Telehealth: Payer: Self-pay | Admitting: *Deleted

## 2020-09-06 NOTE — Telephone Encounter (Signed)
Received vm from pt regarding hair thinning.  No answer, LVM to return call to the office.

## 2020-09-08 ENCOUNTER — Ambulatory Visit: Payer: Medicare FFS | Admitting: Hematology and Oncology

## 2020-09-09 ENCOUNTER — Ambulatory Visit: Payer: Medicare FFS | Admitting: Hematology and Oncology

## 2020-09-13 NOTE — Progress Notes (Signed)
Patient Care Team: Tobe Sos, MD as PCP - General (Internal Medicine) Maisie Fus, MD as Consulting Physician (Obstetrics and Gynecology) [name], MD as Consulting Physician (General Surgery) Nicholas Lose, MD as Consulting Physician (Hematology and Oncology) Kyung Rudd, MD as Consulting Physician (Radiation Oncology) Gardenia Phlegm, NP as Nurse Practitioner (Hematology and Oncology)  DIAGNOSIS:    ICD-10-CM   1. Malignant neoplasm of upper-outer quadrant of right breast in female, estrogen receptor positive ([place])  C50.411    Z17.0     SUMMARY OF ONCOLOGIC HISTORY: Oncology History  Malignant neoplasm of upper-outer quadrant of right female breast ([place])  02/24/2016 Initial Diagnosis   Right breast biopsy 1:00, grade 2-3 IDC, ER 5%, PR 0%, Ki-67 90%, HER-2 positive ratio 7.28, right breast biopsy 1:00 1.5 cm medial to the dominant mass fibrocystic changes; ultrasound and mammogram 02/23/2016: 2.5 cm mass at 1:00 position and a small 46m irregular mass 1.5 cm medial to the larger mass, T2 N0 stage II a clinical stage   03/04/2016 Breast MRI   2.9 cm irregular enhancing mass located within the right breast at 1:00 position, several T2 bright lesions in the right lobe of the liver incompletely visualized could be hepatic cysts   03/22/2016 - 07/05/2016 Neo-Adjuvant Chemotherapy   Neo-Adj TCHP 5 cycles followed by Herceptin and Perjeta   07/07/2016 Breast MRI   Right breast cancer: No measurable enhancement, residual 1-2 mm foci; left breast partially cystic lesion at 2:30 position 2.1 cm which is new 1 indeterminate left axillary lymph node   08/09/2016 Surgery   Right lumpectomy: 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, no residual invasive cancer, 0/4 lymph nodes negative, ER 5% week, PR negative, HER-2 positive, Ki-67 90%: Complete pathologic response ypTis ypN0   09/11/2016 - 10/09/2016 Radiation Therapy   1) Right breast: 42.5 Gy in 16 fractions  2) Right breast boost: 10  Gy in 4 fractions     01/28/2017 -  Anti-estrogen oral therapy   Anastrozole 1 mg daily (prescribed in July, and started in end of September)     CHIEF COMPLIANT: Follow-up of right breast cancer on anastrozole   INTERVAL HISTORY: Brenda Harding a 65y.o. with above-mentioned history of right breast cancer treated with neoadjuvant chemotherapy, lumpectomy, radiation, and is currently on antiestrogen therapy with anastrozole.Mammogram on 06/11/20 showed no evidence of malignancy bilaterally. She presents to the clinic todayfor follow-up.  Her main complaint is hair loss.  ALLERGIES:  is allergic to propoxyphene, inapsine [droperidol], latex, and levaquin [levofloxacin].  MEDICATIONS:  Current Outpatient Medications  Medication Sig Dispense Refill  . acetaminophen (TYLENOL) 500 MG tablet Take 500 mg by mouth every 6 (six) hours as needed (for pain.).    .Marland Kitchenalbuterol (PROVENTIL HFA;VENTOLIN HFA) 108 (90 Base) MCG/ACT inhaler Inhale 2 puffs into the lungs 2 (two) times daily as needed for wheezing or shortness of breath.     . ALPRAZolam (XANAX) 0.5 MG tablet Take 1 tablet (0.5 mg total) by mouth 2 (two) times daily as needed (for anxiety). 60 tablet 0  . anastrozole (ARIMIDEX) 1 MG tablet Take 1 tablet (1 mg total) by mouth at bedtime. 90 tablet 3  . betamethasone dipropionate (DIPROLENE) 0.05 % cream Apply 1 application topically every Monday, Wednesday, and Friday. Applied to vagina    . BREO ELLIPTA 200-25 MCG/INH AEPB Inhale 1 puff into the lungs daily.    . chlorpheniramine-HYDROcodone (TUSSIONEX PENNKINETIC ER) 10-8 MG/5ML SUER Take 5 mLs by mouth at bedtime as needed  for cough.    . diphenoxylate-atropine (LOMOTIL) 2.5-0.025 MG tablet Take 1 tablet by mouth 4 (four) times daily as needed for diarrhea or loose stools. 30 tablet 0  . Garlic 1941 MG CAPS Take 1,000 mg by mouth daily.    Marland Kitchen guaiFENesin (MUCINEX) 600 MG 12 hr tablet Take 600 mg by mouth 2 (two) times daily as needed  for cough or to loosen phlegm.     Marland Kitchen ibuprofen (ADVIL,MOTRIN) 800 MG tablet 800 mg every 8 (eight) hours as needed (for pain.).     Marland Kitchen lansoprazole (PREVACID) 30 MG capsule Take 30 mg by mouth daily.     Marland Kitchen levocetirizine (XYZAL) 5 MG tablet Take 5 mg by mouth every evening.    . montelukast (SINGULAIR) 10 MG tablet Take 10 mg by mouth daily.     . Multiple Vitamin (MULTIVITAMIN WITH MINERALS) TABS tablet Take 1 tablet by mouth daily. Centrum Silver    . PREMARIN vaginal cream Place 1 Applicatorful vaginally 2 (two) times a week. Tuesday & Saturday    . rosuvastatin (CRESTOR) 10 MG tablet Take 1 tablet (10 mg total) by mouth daily.    Marland Kitchen saccharomyces boulardii (FLORASTOR) 250 MG capsule Take 250 mg by mouth daily.    Marland Kitchen spironolactone (ALDACTONE) 50 MG tablet Take 50 mg by mouth 2 (two) times daily.     . Triamcinolone Acetonide (NASACORT ALLERGY 24HR NA) Place 2 sprays into the nose daily.     . Turmeric 500 MG CAPS Take 500 mg by mouth daily.    . valsartan-hydrochlorothiazide (DIOVAN HCT) 160-12.5 MG tablet Take 1 tablet by mouth daily.    . verapamil (CALAN-SR) 180 MG CR tablet Take 2 tablets (360 mg total) by mouth daily.     No current facility-administered medications for this visit.   Facility-Administered Medications Ordered in Other Visits  Medication Dose Route Frequency Provider Last Rate Last Admin  . sodium chloride flush (NS) 0.9 % injection 10 mL  10 mL Intravenous PRN Nicholas Lose, MD   10 mL at 09/27/16 1326  . sodium chloride flush (NS) 0.9 % injection 10 mL  10 mL Intracatheter PRN Nicholas Lose, MD   10 mL at 12/25/16 1450    PHYSICAL EXAMINATION: ECOG PERFORMANCE STATUS: 1 - Symptomatic but completely ambulatory  Vitals:   09/14/20 1442  BP: 132/74  Pulse: 87  Resp: 18  Temp: (!) 97.5 F (36.4 C)  SpO2: 98%   Filed Weights   09/14/20 1442  Weight: 176 lb 14.4 oz (80.2 kg)    BREAST: No palpable masses or nodules in either right or left breasts. No palpable  axillary supraclavicular or infraclavicular adenopathy. (exam performed in the presence of a chaperone)  LABORATORY DATA:  I have reviewed the data as listed CMP Latest Ref Rng & Units 09/09/2018 08/27/2017 03/02/2017  Glucose 70 - 99 mg/dL 83 89 84  BUN 8 - 23 mg/dL '22 19 19  ' Creatinine 0.44 - 1.00 mg/dL 1.28(H) 1.22(H) 1.15(H)  Sodium 135 - 145 mmol/L 140 140 139  Potassium 3.5 - 5.1 mmol/L 4.2 4.0 4.0  Chloride 98 - 111 mmol/L 101 104 105  CO2 22 - 32 mmol/L '29 28 26  ' Calcium 8.9 - 10.3 mg/dL 9.6 9.8 9.4  Total Protein 6.5 - 8.1 g/dL 7.4 7.2 -  Total Bilirubin 0.3 - 1.2 mg/dL 0.5 0.5 -  Alkaline Phos 38 - 126 U/L 120 119 -  AST 15 - 41 U/L 16 17 -  ALT 0 -  44 U/L 17 15 -    Lab Results  Component Value Date   WBC 5.2 09/09/2019   HGB 12.4 09/09/2019   HCT 37.5 09/09/2019   MCV 100.3 (H) 09/09/2019   PLT 220 09/09/2019   NEUTROABS 3.0 09/09/2019    ASSESSMENT & PLAN:  Malignant neoplasm of upper-outer quadrant of right female breast ([place]) Right breast biopsy 1:00, grade 2-3 IDC, ER 5%, PR 0%, Ki-67 90%, HER-2 positive ratio 7.28, right breast biopsy 1:00 1.5 cm medial to the dominant mass fibrocystic changes; ultrasound and mammogram 02/23/2016: 2.5 cm mass at 1:00 position and a small 30m irregular mass 1.5 cm medial to the larger mass, T2 N0 stage II a clinical stage  Treatment summary: 1. Neoadjuvant chemotherapy with TCH Perjeta 6 cycles followed by Herceptinand Perjetamaintenance for 1 year 03/22/2016- 07/05/2017 2. Breast conserving surgery with sentinel lymph node study: 08/08/2016 3. Followed by adjuvant radiation therapy 09/11/16- 10/09/16 4. Followed by antiestrogen therapystarted July 2018  Right lumpectomy 08/08/2016: 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, no residual invasive cancer, 0/4 lymph nodes negative, ER 5% weak, PR negative, HER-2 positive, Ki-67 90%: Complete pathologic response ypTis  ypN0 ------------------------------------------------------------------------------- Current Treatment:Anastrozole started July 2018.  Anastrozole toxicities: Patient takes Premarin vaginal cream Mild fatigue Occasional hot flash Vaginal dryness: Improved with vaginal creams Joint stiffness: Taking turmeric which is helping  Breast cancer surveillance: Mammograms11/03/2021: Benign breast density category B Bone density 12/17/2017: T score -2.7: Osteoporosis Breast exam  09/14/2020: Benign  Return to clinic in 1 year for follow-up     No orders of the defined types were placed in this encounter.  The patient has a good understanding of the overall plan. she agrees with it. she will call with any problems that may develop before the next visit here.  Total time spent: 20 mins including face to face time and time spent for planning, charting and coordination of care  VRulon Eisenmenger MD, MPH 09/14/2020  I, MCloyde ReamsDorshimer, am acting as scribe for Dr. VNicholas Lose  I have reviewed the above documentation for accuracy and completeness, and I agree with the above.

## 2020-09-14 ENCOUNTER — Ambulatory Visit: Payer: Medicare FFS | Admitting: Hematology and Oncology

## 2020-09-14 ENCOUNTER — Telehealth: Payer: Self-pay | Admitting: Hematology and Oncology

## 2020-09-14 ENCOUNTER — Other Ambulatory Visit: Payer: Self-pay

## 2020-09-14 ENCOUNTER — Inpatient Hospital Stay: Payer: Medicare Other | Attending: Hematology and Oncology | Admitting: Hematology and Oncology

## 2020-09-14 DIAGNOSIS — R5383 Other fatigue: Secondary | ICD-10-CM | POA: Diagnosis not present

## 2020-09-14 DIAGNOSIS — Z923 Personal history of irradiation: Secondary | ICD-10-CM | POA: Insufficient documentation

## 2020-09-14 DIAGNOSIS — Z79811 Long term (current) use of aromatase inhibitors: Secondary | ICD-10-CM | POA: Insufficient documentation

## 2020-09-14 DIAGNOSIS — Z170 Estrogen receptor positive status [ER+]: Secondary | ICD-10-CM | POA: Diagnosis not present

## 2020-09-14 DIAGNOSIS — C50411 Malignant neoplasm of upper-outer quadrant of right female breast: Secondary | ICD-10-CM | POA: Insufficient documentation

## 2020-09-14 DIAGNOSIS — M2560 Stiffness of unspecified joint, not elsewhere classified: Secondary | ICD-10-CM | POA: Diagnosis not present

## 2020-09-14 MED ORDER — VITAMIN D3 50 MCG (2000 UT) PO CAPS: 2000.0000 [IU] | ORAL_CAPSULE | Freq: Every day | ORAL | Status: AC

## 2020-09-14 MED ORDER — OMEPRAZOLE 20 MG PO CPDR: 20.0000 mg | DELAYED_RELEASE_CAPSULE | Freq: Every day | ORAL | Status: DC

## 2020-09-14 MED ORDER — BIOTIN 1 MG PO CAPS: 1.0000 | ORAL_CAPSULE | Freq: Every day | ORAL | Status: DC

## 2020-09-14 MED ORDER — ANASTROZOLE 1 MG PO TABS: 1.0000 mg | ORAL_TABLET | Freq: Every day | ORAL | 3 refills | Status: DC

## 2020-09-14 MED ORDER — FAMOTIDINE 20 MG PO TABS: 20.0000 mg | ORAL_TABLET | Freq: Two times a day (BID) | ORAL | Status: DC

## 2020-09-14 NOTE — Assessment & Plan Note (Signed)
Right breast biopsy 1:00, grade 2-3 IDC, ER 5%, PR 0%, Ki-67 90%, HER-2 positive ratio 7.28, right breast biopsy 1:00 1.5 cm medial to the dominant mass fibrocystic changes; ultrasound and mammogram 02/23/2016: 2.5 cm mass at 1:00 position and a small 56m irregular mass 1.5 cm medial to the larger mass, T2 N0 stage II a clinical stage  Treatment summary: 1. Neoadjuvant chemotherapy with TCH Perjeta 6 cycles followed by Herceptinand Perjetamaintenance for 1 year 03/22/2016- 07/05/2017 2. Breast conserving surgery with sentinel lymph node study: 08/08/2016 3. Followed by adjuvant radiation therapy 09/11/16- 10/09/16 4. Followed by antiestrogen therapystarted July 2018  Right lumpectomy 08/08/2016: 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, no residual invasive cancer, 0/4 lymph nodes negative, ER 5% weak, PR negative, HER-2 positive, Ki-67 90%: Complete pathologic response ypTis ypN0 ------------------------------------------------------------------------------- Current Treatment:Anastrozole started July 2018.  Anastrozole toxicities: Patient takes Premarin vaginal cream Mild fatigue Occasional hot flash Vaginal dryness: Improved with vaginal creams Joint stiffness: Taking turmeric which is helping  Breast cancer surveillance: Mammograms11/03/2021: Benign breast density category B Bone density 12/17/2017: T score -2.7: Osteoporosis Breast exam  09/14/2020: Benign  Return to clinic in 1 year for follow-up

## 2020-09-14 NOTE — Telephone Encounter (Signed)
Scheduled appointment per 05/17 los. Patient is aware  

## 2020-12-10 ENCOUNTER — Telehealth: Payer: Self-pay

## 2020-12-10 ENCOUNTER — Other Ambulatory Visit: Payer: Self-pay

## 2020-12-10 DIAGNOSIS — M810 Age-related osteoporosis without current pathological fracture: Secondary | ICD-10-CM

## 2020-12-10 NOTE — Telephone Encounter (Signed)
Patient returned call notifying that [facility] is preferred location for bone density.    RN successfully faxed order to [phone].  Patient notified, no further needs at this time.

## 2020-12-10 NOTE — Telephone Encounter (Signed)
RN returned call, left message for call back.  Re: location for bone density scan.

## 2020-12-30 ENCOUNTER — Encounter: Payer: Self-pay | Admitting: Hematology and Oncology

## 2021-01-12 ENCOUNTER — Telehealth: Payer: Self-pay | Admitting: *Deleted

## 2021-01-12 NOTE — Telephone Encounter (Signed)
Per MD request RN attempt x1 to contact pt regarding recent bone density report.  No answer, LVM for pt to return call to the office.

## 2021-01-13 ENCOUNTER — Telehealth: Payer: Self-pay | Admitting: *Deleted

## 2021-01-13 NOTE — Telephone Encounter (Signed)
Dr Lindi Adie received Bone Density scan. Called patient to discuss. She states that she is going to PCP next week to discuss 3 different treatment options.

## 2021-02-02 ENCOUNTER — Encounter (INDEPENDENT_AMBULATORY_CARE_PROVIDER_SITE_OTHER): Payer: Self-pay | Admitting: *Deleted

## 2021-02-09 ENCOUNTER — Other Ambulatory Visit: Payer: Self-pay | Admitting: Hematology and Oncology

## 2021-02-09 ENCOUNTER — Encounter: Payer: Self-pay | Admitting: Hematology and Oncology

## 2021-02-09 MED ORDER — MINOXIDIL 10 MG PO TABS: 10.0000 mg | ORAL_TABLET | Freq: Every day | ORAL | 3 refills | Status: DC

## 2021-03-07 ENCOUNTER — Ambulatory Visit (INDEPENDENT_AMBULATORY_CARE_PROVIDER_SITE_OTHER): Payer: Medicare Other | Admitting: Gastroenterology

## 2021-03-22 ENCOUNTER — Encounter (INDEPENDENT_AMBULATORY_CARE_PROVIDER_SITE_OTHER): Payer: Self-pay | Admitting: *Deleted

## 2021-05-05 ENCOUNTER — Other Ambulatory Visit: Payer: Self-pay | Admitting: Hematology and Oncology

## 2021-05-05 DIAGNOSIS — Z853 Personal history of malignant neoplasm of breast: Secondary | ICD-10-CM

## 2021-06-15 ENCOUNTER — Ambulatory Visit
Admission: RE | Admit: 2021-06-15 | Discharge: 2021-06-15 | Disposition: A | Discharge status: Home / Self Care | Payer: Medicare Other | Source: Ambulatory Visit | Attending: Hematology and Oncology | Admitting: Hematology and Oncology

## 2021-06-15 ENCOUNTER — Other Ambulatory Visit: Payer: Self-pay

## 2021-06-15 DIAGNOSIS — Z853 Personal history of malignant neoplasm of breast: Secondary | ICD-10-CM

## 2021-06-16 ENCOUNTER — Telehealth (INDEPENDENT_AMBULATORY_CARE_PROVIDER_SITE_OTHER): Payer: Self-pay

## 2021-06-16 ENCOUNTER — Encounter (INDEPENDENT_AMBULATORY_CARE_PROVIDER_SITE_OTHER): Payer: Self-pay | Admitting: Gastroenterology

## 2021-06-16 ENCOUNTER — Other Ambulatory Visit (INDEPENDENT_AMBULATORY_CARE_PROVIDER_SITE_OTHER): Payer: Self-pay

## 2021-06-16 ENCOUNTER — Ambulatory Visit (INDEPENDENT_AMBULATORY_CARE_PROVIDER_SITE_OTHER): Payer: Medicare Other | Admitting: Gastroenterology

## 2021-06-16 DIAGNOSIS — Z86010 Personal history of colonic polyps: Secondary | ICD-10-CM

## 2021-06-16 DIAGNOSIS — R490 Dysphonia: Secondary | ICD-10-CM | POA: Diagnosis not present

## 2021-06-16 DIAGNOSIS — K219 Gastro-esophageal reflux disease without esophagitis: Secondary | ICD-10-CM

## 2021-06-16 MED ORDER — SUTAB 1479-225-188 MG PO TABS
1.0000 | ORAL_TABLET | Freq: Once | ORAL | 0 refills | Status: AC
Start: 2021-06-16 — End: 2021-06-16

## 2021-06-16 NOTE — Progress Notes (Signed)
Maylon Peppers, M.D. Gastroenterology & Hepatology [facility] [address] [place], [place] [address] Primary Care Physician: Tobe Sos, MD [place] [id number]  Referring MD: PCP  Chief Complaint: GERD, history of colon polyps  History of Present Illness: Brenda Harding is a 66 y.o. female with PMH breast cancer s/p lumpectomy and chemoradiation therapy, GERD, IBS, who presents for evaluation of GERD and history of colon polyps.  Patient reports that she has had a longstanding history of GERD. She has been taking Prevacid 30 mg in AM and usually takes coffee 20 minutes after taking it, and famotidine 40 mg at night, she also takes 2 Tums as night for precaution but also "to have the required calcium intake". She reports that she does not have any heartburn or dysphagia, but she feels hoarseness and "phlegm sensation" whenever she eats chocolates. She also reports that she used to be on OTC omeprazole on top of her current regimen, but she has been off this for a while.  She has avoided taking coffee and chocolate in the past which helped significantly but states that during Christmas and Valentine's she has been eating more chocolates, which has flared her symptoms more. It has led to hoarseness, which has affected her singing skills recently.  The patient denies having any dysphagia, odynophagia, nausea, vomiting, fever, chills, hematochezia, melena, hematemesis, abdominal distention, abdominal pain, diarrhea, jaundice, pruritus or weight loss.  Last XHB:ZJIRCVEL in 2014, no report is available. Last Colonoscopy:2014 - reports she had a polyp in the past, but no report is available.  FHx: neg for any gastrointestinal/liver disease, no malignancies Social: neg smoking, alcohol or illicit drug use Surgical: no abdominal surgeries  Past Medical History: Past Medical History:  Diagnosis Date   Anxiety    Arthritis     Breast cancer ([place])    Bronchitis    HISTORY    Cough productive of purulent sputum    LIGHT GREEN    Early cataracts, bilateral    GERD (gastroesophageal reflux disease)    Headache    History of wheezing    Hypertension    Malignant neoplasm of upper-outer quadrant of right female breast ([place]) 02/25/2016   Personal history of chemotherapy    Personal history of radiation therapy    Scoliosis     Past Surgical History: Past Surgical History:  Procedure Laterality Date   BACK SURGERY     scoliosis spinal surgery 1970   benign tumor removal     1978   BREAST BIOPSY     BREAST LUMPECTOMY Right    08/09/2016   BREAST LUMPECTOMY WITH RADIOACTIVE SEED AND SENTINEL LYMPH NODE BIOPSY Right 08/09/2016   Procedure: RIGHT BREAST LUMPECTOMY WITH RADIOACTIVE SEED AND SENTINEL LYMPH NODE BIOPSY;  Surgeon: [name], MD;  Location: [facility];  Service: General;  Laterality: Right;   CARPAL TUNNEL RELEASE     PORT-A-CATH REMOVAL Left 03/08/2017   Procedure: REMOVAL PORT-A-CATH;  Surgeon: [name], MD;  Location: [facility];  Service: General;  Laterality: Left;   PORTACATH PLACEMENT Left 03/21/2016   Procedure: INSERTION PORT-A-CATH;  Surgeon: [name], MD;  Location: [facility];  Service: General;  Laterality: Left;   TRIGGER FINGER RELEASE      Family History: Family History  Problem Relation Age of Onset   Breast cancer Maternal Aunt    Breast cancer Cousin    Colon cancer Cousin     Social History: Social History  Tobacco Use  Smoking Status Never  Smokeless Tobacco Never   Social History   Substance and Sexual Activity  Alcohol Use No   Social History   Substance and Sexual Activity  Drug Use No    Allergies: Allergies  Allergen Reactions   Propoxyphene Other (See Comments)    Passed out, gets dizzy   Inapsine [Droperidol]     PATIENT PREFERENCE PATIENT HAS HAD NO REACTION TO THIS Pt does not want to be given this, sister had allergy to this   Latex  Itching   Levaquin [Levofloxacin] Diarrhea and Nausea And Vomiting    Medications: Current Outpatient Medications  Medication Sig Dispense Refill   acetaminophen (TYLENOL) 500 MG tablet Take 500 mg by mouth every 6 (six) hours as needed (for pain.).     albuterol (PROVENTIL HFA;VENTOLIN HFA) 108 (90 Base) MCG/ACT inhaler Inhale 2 puffs into the lungs 2 (two) times daily as needed for wheezing or shortness of breath.      ALPRAZolam (XANAX) 0.5 MG tablet Take 1 tablet (0.5 mg total) by mouth 2 (two) times daily as needed (for anxiety). 60 tablet 0   anastrozole (ARIMIDEX) 1 MG tablet Take 1 tablet (1 mg total) by mouth at bedtime. 90 tablet 3   betamethasone dipropionate (DIPROLENE) 0.05 % cream Apply 1 application topically. Applied to vagina on Mon and Thurs     Biotin 1 MG CAPS Take 1 capsule by mouth daily. 30 capsule    BREO ELLIPTA 200-25 MCG/INH AEPB Inhale 1 puff into the lungs daily.     chlorpheniramine-HYDROcodone (TUSSIONEX) 10-8 MG/5ML SUER Take 5 mLs by mouth at bedtime as needed for cough.     Cholecalciferol (VITAMIN D3) 50 MCG (2000 UT) capsule Take 1 capsule (2,000 Units total) by mouth daily.     famotidine (PEPCID) 20 MG tablet Take 1 tablet (20 mg total) by mouth 2 (two) times daily. (Patient taking differently: Take 20 mg by mouth at bedtime.)     guaiFENesin (MUCINEX) 600 MG 12 hr tablet Take 600 mg by mouth 2 (two) times daily as needed for cough or to loosen phlegm.      lansoprazole (PREVACID) 30 MG capsule Take 30 mg by mouth daily.      levocetirizine (XYZAL) 5 MG tablet Take 5 mg by mouth every evening.     montelukast (SINGULAIR) 10 MG tablet Take 10 mg by mouth daily.      PREMARIN vaginal cream Place 1 Applicatorful vaginally 2 (two) times a week. Tuesday & Saturday     rosuvastatin (CRESTOR) 10 MG tablet Take 1 tablet (10 mg total) by mouth daily.     spironolactone (ALDACTONE) 50 MG tablet Take 50 mg by mouth 2 (two) times daily.      Triamcinolone Acetonide  (NASACORT ALLERGY 24HR NA) Place 2 sprays into the nose daily.      Turmeric 500 MG CAPS Take 500 mg by mouth daily.     valsartan-hydrochlorothiazide (DIOVAN HCT) 160-12.5 MG tablet Take 1 tablet by mouth daily. (Patient taking differently: Take 0.5 tablets by mouth daily.)     verapamil (CALAN-SR) 180 MG CR tablet Take 2 tablets (360 mg total) by mouth daily. (Patient taking differently: Take 240 mg by mouth daily.)     minoxidil (LONITEN) 10 MG tablet Take 1 tablet (10 mg total) by mouth daily. (Patient not taking: Reported on 06/16/2021) 90 tablet 3   Multiple Vitamin (MULTIVITAMIN WITH MINERALS) TABS tablet Take 1 tablet by mouth daily. Centrum Silver (  Patient not taking: Reported on 06/16/2021)     saccharomyces boulardii (FLORASTOR) 250 MG capsule Take 250 mg by mouth daily. (Patient not taking: Reported on 06/16/2021)     No current facility-administered medications for this visit.   Facility-Administered Medications Ordered in Other Visits  Medication Dose Route Frequency Provider Last Rate Last Admin   sodium chloride flush (NS) 0.9 % injection 10 mL  10 mL Intravenous PRN Nicholas Lose, MD   10 mL at 09/27/16 1326   sodium chloride flush (NS) 0.9 % injection 10 mL  10 mL Intracatheter PRN Nicholas Lose, MD   10 mL at 12/25/16 1450    Review of Systems: GENERAL: negative for malaise, night sweats HEENT: No changes in hearing or vision, no nose bleeds or other nasal problems. NECK: Negative for lumps, goiter, pain and significant neck swelling RESPIRATORY: Negative for cough, wheezing CARDIOVASCULAR: Negative for chest pain, leg swelling, palpitations, orthopnea GI: SEE HPI MUSCULOSKELETAL: Negative for joint pain or swelling, back pain, and muscle pain. SKIN: Negative for lesions, rash PSYCH: Negative for sleep disturbance, mood disorder and recent psychosocial stressors. HEMATOLOGY Negative for prolonged bleeding, bruising easily, and swollen nodes. ENDOCRINE: Negative for cold or  heat intolerance, polyuria, polydipsia and goiter. NEURO: negative for tremor, gait imbalance, syncope and seizures. The remainder of the review of systems is noncontributory.   Physical Exam: BP (!) 153/88 (BP Location: Left Arm, Patient Position: Sitting, Cuff Size: Large)    Pulse (!) 106    Temp 98.2 F (36.8 C) (Oral)    Ht 5\' 4"  (1.626 m)    Wt 172 lb 3.2 oz (78.1 kg)    BMI 29.56 kg/m  GENERAL: The patient is AO x3, in no acute distress. HEENT: Head is normocephalic and atraumatic. EOMI are intact. Mouth is well hydrated and without lesions. NECK: Supple. No masses LUNGS: Clear to auscultation. No presence of rhonchi/wheezing/rales. Adequate chest expansion HEART: RRR, normal s1 and s2. ABDOMEN: Soft, nontender, no guarding, no peritoneal signs, and nondistended. BS +. No masses. EXTREMITIES: Without any cyanosis, clubbing, rash, lesions or edema. NEUROLOGIC: AOx3, no focal motor deficit. SKIN: no jaundice, no rashes   Imaging/Labs: as above  I personally reviewed and interpreted the available labs, imaging and endoscopic files.  Impression and Plan: Brenda Harding is a 66 y.o. female with PMH breast cancer s/p lumpectomy and chemoradiation therapy, GERD, IBS, who presents for evaluation of GERD and history of colon polyps.  Regarding her GERD, The patient has presented chronic symptoms of hoarseness and throat irritation when eating triggering foods.  Even though she has improved with the use of PPI, I suspect there is a component of LPR that explain part of her symptoms.  Due to this, I advised the patient to continue Prevacid 30 mg every day and the Pepcid at night as she was taking but encouraged her to avoid consuming chocolate as this is a clear trigger for her symptoms.  However, given her chronic history of possible GERD, we will explore her symptoms further with an EGD.  Also will schedule a colonoscopy at the same time given her history of colonic polyps.  - Schedule EGD  and colonoscopy - Continue Prevacid 30 mg qday - Continue Pepcid at night - Explained presumed etiology of reflux symptoms. Instruction provided in the use of antireflux medication - patient should take medication in the morning 30-45 minutes before eating breakfast. Discussed avoidance of eating within 2 hours of lying down to sleep and benefit of blocks  to elevate head of bed. Also, will benefit from avoiding chocolates, carbonated drinks/sodas or food that has tomatoes, spicy or greasy food.  All questions were answered.      Maylon Peppers, MD Gastroenterology and Hepatology [facility]

## 2021-06-16 NOTE — Telephone Encounter (Signed)
Brenda Harding Ann Brynnley Dayrit, CMA  ?

## 2021-06-16 NOTE — Telephone Encounter (Signed)
Brenda Harding Brenda Harding, CMA  ?

## 2021-06-16 NOTE — Patient Instructions (Addendum)
Schedule EGD and colonoscopy Continue Prevacid 30 mg qday Continue Pepcid at night Explained presumed etiology of reflux symptoms. Instruction provided in the use of antireflux medication - patient should take medication in the morning 30-45 minutes before eating breakfast. Discussed avoidance of eating within 2 hours of lying down to sleep and benefit of blocks to elevate head of bed. Also, will benefit from avoiding chocolates, carbonated drinks/sodas or food that has tomatoes, spicy or greasy food.

## 2021-06-17 ENCOUNTER — Encounter (INDEPENDENT_AMBULATORY_CARE_PROVIDER_SITE_OTHER): Payer: Self-pay

## 2021-06-24 ENCOUNTER — Encounter (HOSPITAL_COMMUNITY): Payer: Self-pay

## 2021-06-24 ENCOUNTER — Ambulatory Visit (HOSPITAL_COMMUNITY): Admit: 2021-06-24 | Payer: Medicare Other | Admitting: Gastroenterology

## 2021-06-24 SURGERY — COLONOSCOPY WITH PROPOFOL
Anesthesia: Monitor Anesthesia Care

## 2021-06-24 NOTE — Patient Instructions (Addendum)
Brenda Harding  06/24/2021     @PREFPERIOPPHARMACY @   Your procedure is scheduled on  07/01/2021.   [name]   Call this number if you have problems the morning of surgery:  [phone]   Remember:  Follow the diet and prep instructions given to you by the office.       Use your inhalers before you come and bring your rescue inhaler  with you.    Take these medicines the morning of surgery with A SIP OF WATER                    xanax (if needed).     Do not wear jewelry, make-up or nail polish.  Do not wear lotions, powders, or perfumes, or deodorant.  Do not shave 48 hours prior to surgery.  Men may shave face and neck.  Do not bring valuables to the hospital.  [facility] is not responsible for any belongings or valuables.  Contacts, dentures or bridgework may not be worn into surgery.  Leave your suitcase in the car.  After surgery it may be brought to your room.  For patients admitted to the hospital, discharge time will be determined by your treatment team.  Patients discharged the day of surgery will not be allowed to drive home and must have someone with them for 24 hours.    Special instructions:   DO NOT smoke tobacco or vape for 24 hous before your procedure.  Please read over the following fact sheets that you were given. Anesthesia Post-op Instructions and Care and Recovery After Surgery       Upper Endoscopy, Adult, Care After This sheet gives you information about how to care for yourself after your procedure. Your health care provider may also give you more specific instructions. If you have problems or questions, contact your health care provider. What can I expect after the procedure? After the procedure, it is common to have: A sore throat. Mild stomach pain or discomfort. Bloating. Nausea. Follow these instructions at home:  Follow instructions from your health care provider about what to eat or drink  after your procedure. Return to your normal activities as told by your health care provider. Ask your health care provider what activities are safe for you. Take over-the-counter and prescription medicines only as told by your health care provider. If you were given a sedative during the procedure, it can affect you for several hours. Do not drive or operate machinery until your health care provider says that it is safe. Keep all follow-up visits as told by your health care provider. This is important. Contact a health care provider if you have: A sore throat that lasts longer than one day. Trouble swallowing. Get help right away if: You vomit blood or your vomit looks like coffee grounds. You have: A fever. Bloody, black, or tarry stools. A severe sore throat or you cannot swallow. Difficulty breathing. Severe pain in your chest or abdomen. Summary After the procedure, it is common to have a sore throat, mild stomach discomfort, bloating, and nausea. If you were given a sedative during the procedure, it can affect you for several hours. Do not drive or operate machinery until your health care provider says that it is safe. Follow instructions from your health care provider about what to eat or drink after your procedure. Return to your normal activities as told  by your health care provider. This information is not intended to replace advice given to you by your health care provider. Make sure you discuss any questions you have with your health care provider. Document Revised: 02/21/2019 Document Reviewed: 09/17/2017 Elsevier Patient Education  2022 [place]. Colonoscopy, Adult, Care After This sheet gives you information about how to care for yourself after your procedure. Your health care provider may also give you more specific instructions. If you have problems or questions, contact your health care provider. What can I expect after the procedure? After the procedure, it is common  to have: A small amount of blood in your stool for 24 hours after the procedure. Some gas. Mild cramping or bloating of your abdomen. Follow these instructions at home: Eating and drinking  Drink enough fluid to keep your urine pale yellow. Follow instructions from your health care provider about eating or drinking restrictions. Resume your normal diet as instructed by your health care provider. Avoid heavy or fried foods that are hard to digest. Activity Rest as told by your health care provider. Avoid sitting for a long time without moving. Get up to take short walks every 1-2 hours. This is important to improve blood flow and breathing. Ask for help if you feel weak or unsteady. Return to your normal activities as told by your health care provider. Ask your health care provider what activities are safe for you. Managing cramping and bloating  Try walking around when you have cramps or feel bloated. Apply heat to your abdomen as told by your health care provider. Use the heat source that your health care provider recommends, such as a moist heat pack or a heating pad. Place a towel between your skin and the heat source. Leave the heat on for 20-30 minutes. Remove the heat if your skin turns bright red. This is especially important if you are unable to feel pain, heat, or cold. You may have a greater risk of getting burned. General instructions If you were given a sedative during the procedure, it can affect you for several hours. Do not drive or operate machinery until your health care provider says that it is safe. For the first 24 hours after the procedure: Do not sign important documents. Do not drink alcohol. Do your regular daily activities at a slower pace than normal. Eat soft foods that are easy to digest. Take over-the-counter and prescription medicines only as told by your health care provider. Keep all follow-up visits as told by your health care provider. This is  important. Contact a health care provider if: You have blood in your stool 2-3 days after the procedure. Get help right away if you have: More than a small spotting of blood in your stool. Large blood clots in your stool. Swelling of your abdomen. Nausea or vomiting. A fever. Increasing pain in your abdomen that is not relieved with medicine. Summary After the procedure, it is common to have a small amount of blood in your stool. You may also have mild cramping and bloating of your abdomen. If you were given a sedative during the procedure, it can affect you for several hours. Do not drive or operate machinery until your health care provider says that it is safe. Get help right away if you have a lot of blood in your stool, nausea or vomiting, a fever, or increased pain in your abdomen. This information is not intended to replace advice given to you by your health care provider. Make  sure you discuss any questions you have with your health care provider. Document Revised: 02/21/2019 Document Reviewed: 11/11/2018 Elsevier Patient Education  [place] After This sheet gives you information about how to care for yourself after your procedure. Your health care provider may also give you more specific instructions. If you have problems or questions, contact your health care provider. What can I expect after the procedure? After the procedure, it is common to have: Tiredness. Forgetfulness about what happened after the procedure. Impaired judgment for important decisions. Nausea or vomiting. Some difficulty with balance. Follow these instructions at home: For the time period you were told by your health care provider:   Rest as needed. Do not participate in activities where you could fall or become injured. Do not drive or use machinery. Do not drink alcohol. Do not take sleeping pills or medicines that cause drowsiness. Do not make important  decisions or sign legal documents. Do not take care of children on your own. Eating and drinking Follow the diet that is recommended by your health care provider. Drink enough fluid to keep your urine pale yellow. If you vomit: Drink water, juice, or soup when you can drink without vomiting. Make sure you have little or no nausea before eating solid foods. General instructions Have a responsible adult stay with you for the time you are told. It is important to have someone help care for you until you are awake and alert. Take over-the-counter and prescription medicines only as told by your health care provider. If you have sleep apnea, surgery and certain medicines can increase your risk for breathing problems. Follow instructions from your health care provider about wearing your sleep device: Anytime you are sleeping, including during daytime naps. While taking prescription pain medicines, sleeping medicines, or medicines that make you drowsy. Avoid smoking. Keep all follow-up visits as told by your health care provider. This is important. Contact a health care provider if: You keep feeling nauseous or you keep vomiting. You feel light-headed. You are still sleepy or having trouble with balance after 24 hours. You develop a rash. You have a fever. You have redness or swelling around the IV site. Get help right away if: You have trouble breathing. You have new-onset confusion at home. Summary For several hours after your procedure, you may feel tired. You may also be forgetful and have poor judgment. Have a responsible adult stay with you for the time you are told. It is important to have someone help care for you until you are awake and alert. Rest as told. Do not drive or operate machinery. Do not drink alcohol or take sleeping pills. Get help right away if you have trouble breathing, or if you suddenly become confused. This information is not intended to replace advice given to you  by your health care provider. Make sure you discuss any questions you have with your health care provider. Document Revised: 01/01/2020 Document Reviewed: 03/20/2019 Elsevier Patient Education  2022 [employer].

## 2021-06-27 ENCOUNTER — Encounter (INDEPENDENT_AMBULATORY_CARE_PROVIDER_SITE_OTHER): Payer: Self-pay

## 2021-06-27 ENCOUNTER — Telehealth (INDEPENDENT_AMBULATORY_CARE_PROVIDER_SITE_OTHER): Payer: Self-pay

## 2021-06-27 MED ORDER — NA SULFATE-K SULFATE-MG SULF 17.5-3.13-1.6 GM/177ML PO SOLN: 1.0000 | ORAL | 0 refills | Status: DC

## 2021-06-27 NOTE — Telephone Encounter (Signed)
Brenda Harding, CMA  ?

## 2021-06-28 ENCOUNTER — Encounter (HOSPITAL_COMMUNITY)
Admission: RE | Admit: 2021-06-28 | Discharge: 2021-06-28 | Disposition: A | Discharge status: Home / Self Care | Payer: Medicare Other | Source: Ambulatory Visit | Attending: Gastroenterology | Admitting: Gastroenterology

## 2021-06-28 ENCOUNTER — Encounter: Payer: Self-pay | Admitting: Hematology and Oncology

## 2021-07-12 NOTE — Patient Instructions (Signed)
? ? ? ? ? ? ? ? Mikey Bussing ? 07/12/2021  ?  ? '@PREFPERIOPPHARMACY'$ @ ? ? Your procedure is scheduled on  07/19/2021. ? ? Report to [facility] at  [place]. ? ? Call this number if you have problems the morning of surgery: ? [phone] ? ? Remember: ? Follow the diet and prep instructions given to you by the office. ? ?Use your inhalers before you come and bring your rescue inhaler with you. ?  ? Take these medicines the morning of surgery with A SIP OF WATER  ? ?xanax(if needed), prevacid. ?  ? ? Do not wear jewelry, make-up or nail polish. ? Do not wear lotions, powders, or perfumes, or deodorant. ? Do not shave 48 hours prior to surgery.  Men may shave face and neck. ? Do not bring valuables to the hospital. ? [facility] is not responsible for any belongings or valuables. ? ?Contacts, dentures or bridgework may not be worn into surgery.  Leave your suitcase in the car.  After surgery it may be brought to your room. ? ?For patients admitted to the hospital, discharge time will be determined by your treatment team. ? ?Patients discharged the day of surgery will not be allowed to drive home and must have someone with them for 24 hours.  ? ? ?Special instructions:   DO NOT smoke tobacco or vape for 24 hours before your procedure. ? ?Please read over the following fact sheets that you were given. ?Anesthesia Post-op Instructions and Care and Recovery After Surgery ?  ? ? ? Upper Endoscopy, Adult, Care After ?This sheet gives you information about how to care for yourself after your procedure. Your health care provider may also give you more specific instructions. If you have problems or questions, contact your health care provider. ?What can I expect after the procedure? ?After the procedure, it is common to have: ?A sore throat. ?Mild stomach pain or discomfort. ?Bloating. ?Nausea. ?Follow these instructions at home: ? ?Follow instructions from your health care provider about what to eat or drink after your  procedure. ?Return to your normal activities as told by your health care provider. Ask your health care provider what activities are safe for you. ?Take over-the-counter and prescription medicines only as told by your health care provider. ?If you were given a sedative during the procedure, it can affect you for several hours. Do not drive or operate machinery until your health care provider says that it is safe. ?Keep all follow-up visits as told by your health care provider. This is important. ?Contact a health care provider if you have: ?A sore throat that lasts longer than one day. ?Trouble swallowing. ?Get help right away if: ?You vomit blood or your vomit looks like coffee grounds. ?You have: ?A fever. ?Bloody, black, or tarry stools. ?A severe sore throat or you cannot swallow. ?Difficulty breathing. ?Severe pain in your chest or abdomen. ?Summary ?After the procedure, it is common to have a sore throat, mild stomach discomfort, bloating, and nausea. ?If you were given a sedative during the procedure, it can affect you for several hours. Do not drive or operate machinery until your health care provider says that it is safe. ?Follow instructions from your health care provider about what to eat or drink after your procedure. ?Return to your normal activities as told by your health care provider. ?This information is not intended to replace advice given to you by your health care provider. Make sure you  discuss any questions you have with your health care provider. ?Document Revised: 02/21/2019 Document Reviewed: 09/17/2017 ?Elsevier Patient Education ? [place]. ?Colonoscopy, Adult, Care After ?This sheet gives you information about how to care for yourself after your procedure. Your health care provider may also give you more specific instructions. If you have problems or questions, contact your health care provider. ?What can I expect after the procedure? ?After the procedure, it is common to  have: ?A small amount of blood in your stool for 24 hours after the procedure. ?Some gas. ?Mild cramping or bloating of your abdomen. ?Follow these instructions at home: ?Eating and drinking ? ?Drink enough fluid to keep your urine pale yellow. ?Follow instructions from your health care provider about eating or drinking restrictions. ?Resume your normal diet as instructed by your health care provider. Avoid heavy or fried foods that are hard to digest. ?Activity ?Rest as told by your health care provider. ?Avoid sitting for a long time without moving. Get up to take short walks every 1-2 hours. This is important to improve blood flow and breathing. Ask for help if you feel weak or unsteady. ?Return to your normal activities as told by your health care provider. Ask your health care provider what activities are safe for you. ?Managing cramping and bloating ? ?Try walking around when you have cramps or feel bloated. ?Apply heat to your abdomen as told by your health care provider. Use the heat source that your health care provider recommends, such as a moist heat pack or a heating pad. ?Place a towel between your skin and the heat source. ?Leave the heat on for 20-30 minutes. ?Remove the heat if your skin turns bright red. This is especially important if you are unable to feel pain, heat, or cold. You may have a greater risk of getting burned. ?General instructions ?If you were given a sedative during the procedure, it can affect you for several hours. Do not drive or operate machinery until your health care provider says that it is safe. ?For the first 24 hours after the procedure: ?Do not sign important documents. ?Do not drink alcohol. ?Do your regular daily activities at a slower pace than normal. ?Eat soft foods that are easy to digest. ?Take over-the-counter and prescription medicines only as told by your health care provider. ?Keep all follow-up visits as told by your health care provider. This is  important. ?Contact a health care provider if: ?You have blood in your stool 2-3 days after the procedure. ?Get help right away if you have: ?More than a small spotting of blood in your stool. ?Large blood clots in your stool. ?Swelling of your abdomen. ?Nausea or vomiting. ?A fever. ?Increasing pain in your abdomen that is not relieved with medicine. ?Summary ?After the procedure, it is common to have a small amount of blood in your stool. You may also have mild cramping and bloating of your abdomen. ?If you were given a sedative during the procedure, it can affect you for several hours. Do not drive or operate machinery until your health care provider says that it is safe. ?Get help right away if you have a lot of blood in your stool, nausea or vomiting, a fever, or increased pain in your abdomen. ?This information is not intended to replace advice given to you by your health care provider. Make sure you discuss any questions you have with your health care provider. ?Document Revised: 02/21/2019 Document Reviewed: 11/11/2018 ?Elsevier Patient Education ? 2022 Elsevier  [employer] ?Monitored Anesthesia Care, Care After ?This sheet gives you information about how to care for yourself after your procedure. Your health care provider may also give you more specific instructions. If you have problems or questions, contact your health care provider. ?What can I expect after the procedure? ?After the procedure, it is common to have: ?Tiredness. ?Forgetfulness about what happened after the procedure. ?Impaired judgment for important decisions. ?Nausea or vomiting. ?Some difficulty with balance. ?Follow these instructions at home: ?For the time period you were told by your health care provider: ?  ?Rest as needed. ?Do not participate in activities where you could fall or become injured. ?Do not drive or use machinery. ?Do not drink alcohol. ?Do not take sleeping pills or medicines that cause drowsiness. ?Do not make important  decisions or sign legal documents. ?Do not take care of children on your own. ?Eating and drinking ?Follow the diet that is recommended by your health care provider. ?Drink enough fluid to keep your urine pale yellow. ?If you vom

## 2021-07-14 ENCOUNTER — Encounter (HOSPITAL_COMMUNITY)
Admission: RE | Admit: 2021-07-14 | Discharge: 2021-07-14 | Disposition: A | Discharge status: Home / Self Care | Payer: Medicare Other | Source: Ambulatory Visit | Attending: Gastroenterology | Admitting: Gastroenterology

## 2021-07-14 VITALS — Ht 64.0 in | Wt 172.2 lb

## 2021-07-14 DIAGNOSIS — Z79899 Other long term (current) drug therapy: Secondary | ICD-10-CM

## 2021-07-19 ENCOUNTER — Other Ambulatory Visit: Payer: Self-pay

## 2021-07-19 ENCOUNTER — Encounter (HOSPITAL_COMMUNITY): Payer: Self-pay | Admitting: Gastroenterology

## 2021-07-19 ENCOUNTER — Encounter (HOSPITAL_COMMUNITY)
Admission: RE | Disposition: A | Discharge status: Home / Self Care | Payer: Self-pay | Source: Home / Self Care | Attending: Gastroenterology

## 2021-07-19 ENCOUNTER — Encounter (INDEPENDENT_AMBULATORY_CARE_PROVIDER_SITE_OTHER): Payer: Self-pay | Admitting: *Deleted

## 2021-07-19 ENCOUNTER — Ambulatory Visit (HOSPITAL_BASED_OUTPATIENT_CLINIC_OR_DEPARTMENT_OTHER): Payer: Medicare Other | Admitting: Anesthesiology

## 2021-07-19 ENCOUNTER — Ambulatory Visit (HOSPITAL_COMMUNITY): Payer: Medicare Other | Admitting: Anesthesiology

## 2021-07-19 ENCOUNTER — Ambulatory Visit (HOSPITAL_COMMUNITY)
Admission: RE | Admit: 2021-07-19 | Discharge: 2021-07-19 | Disposition: A | Discharge status: Home / Self Care | Payer: Medicare Other | Attending: Gastroenterology | Admitting: Gastroenterology

## 2021-07-19 DIAGNOSIS — D123 Benign neoplasm of transverse colon: Secondary | ICD-10-CM | POA: Insufficient documentation

## 2021-07-19 DIAGNOSIS — K635 Polyp of colon: Secondary | ICD-10-CM | POA: Diagnosis not present

## 2021-07-19 DIAGNOSIS — K2970 Gastritis, unspecified, without bleeding: Secondary | ICD-10-CM

## 2021-07-19 DIAGNOSIS — K449 Diaphragmatic hernia without obstruction or gangrene: Secondary | ICD-10-CM

## 2021-07-19 DIAGNOSIS — I119 Hypertensive heart disease without heart failure: Secondary | ICD-10-CM | POA: Diagnosis not present

## 2021-07-19 DIAGNOSIS — Z853 Personal history of malignant neoplasm of breast: Secondary | ICD-10-CM | POA: Diagnosis not present

## 2021-07-19 DIAGNOSIS — D120 Benign neoplasm of cecum: Secondary | ICD-10-CM | POA: Diagnosis not present

## 2021-07-19 DIAGNOSIS — D124 Benign neoplasm of descending colon: Secondary | ICD-10-CM | POA: Diagnosis not present

## 2021-07-19 DIAGNOSIS — K3189 Other diseases of stomach and duodenum: Secondary | ICD-10-CM

## 2021-07-19 DIAGNOSIS — D125 Benign neoplasm of sigmoid colon: Secondary | ICD-10-CM | POA: Insufficient documentation

## 2021-07-19 DIAGNOSIS — K644 Residual hemorrhoidal skin tags: Secondary | ICD-10-CM

## 2021-07-19 DIAGNOSIS — K317 Polyp of stomach and duodenum: Secondary | ICD-10-CM

## 2021-07-19 DIAGNOSIS — Z1211 Encounter for screening for malignant neoplasm of colon: Secondary | ICD-10-CM | POA: Insufficient documentation

## 2021-07-19 DIAGNOSIS — Z09 Encounter for follow-up examination after completed treatment for conditions other than malignant neoplasm: Secondary | ICD-10-CM

## 2021-07-19 DIAGNOSIS — K648 Other hemorrhoids: Secondary | ICD-10-CM | POA: Diagnosis not present

## 2021-07-19 DIAGNOSIS — J449 Chronic obstructive pulmonary disease, unspecified: Secondary | ICD-10-CM | POA: Diagnosis not present

## 2021-07-19 DIAGNOSIS — K589 Irritable bowel syndrome without diarrhea: Secondary | ICD-10-CM | POA: Diagnosis not present

## 2021-07-19 DIAGNOSIS — K5730 Diverticulosis of large intestine without perforation or abscess without bleeding: Secondary | ICD-10-CM

## 2021-07-19 DIAGNOSIS — F419 Anxiety disorder, unspecified: Secondary | ICD-10-CM | POA: Diagnosis not present

## 2021-07-19 DIAGNOSIS — Z79899 Other long term (current) drug therapy: Secondary | ICD-10-CM

## 2021-07-19 DIAGNOSIS — K219 Gastro-esophageal reflux disease without esophagitis: Secondary | ICD-10-CM | POA: Diagnosis not present

## 2021-07-19 DIAGNOSIS — Z86010 Personal history of colonic polyps: Secondary | ICD-10-CM

## 2021-07-19 DIAGNOSIS — D122 Benign neoplasm of ascending colon: Secondary | ICD-10-CM | POA: Diagnosis not present

## 2021-07-19 HISTORY — PX: ESOPHAGOGASTRODUODENOSCOPY (EGD) WITH PROPOFOL: SHX5813

## 2021-07-19 HISTORY — PX: POLYPECTOMY: SHX5525

## 2021-07-19 HISTORY — PX: HEMOSTASIS CLIP PLACEMENT: SHX6857

## 2021-07-19 HISTORY — PX: COLONOSCOPY WITH PROPOFOL: SHX5780

## 2021-07-19 LAB — BASIC METABOLIC PANEL
Anion gap: 11 (ref 5–15)
BUN: 19 mg/dL (ref 8–23)
CO2: 24 mmol/L (ref 22–32)
Calcium: 9.1 mg/dL (ref 8.9–10.3)
Chloride: 104 mmol/L (ref 98–111)
Creatinine, Ser: 1.26 mg/dL — ABNORMAL HIGH (ref 0.44–1.00)
GFR, Estimated: 47 mL/min — ABNORMAL LOW (ref >60)
Glucose, Bld: 86 mg/dL (ref 70–99)
Potassium: 4 mmol/L (ref 3.5–5.1)
Sodium: 139 mmol/L (ref 135–145)

## 2021-07-19 LAB — HM COLONOSCOPY

## 2021-07-19 SURGERY — COLONOSCOPY WITH PROPOFOL
Anesthesia: General

## 2021-07-19 MED ORDER — PROPOFOL 500 MG/50ML IV EMUL
INTRAVENOUS | Status: DC | PRN
  Administered 2021-07-19: 100 ug/kg/min via INTRAVENOUS

## 2021-07-19 MED ORDER — LACTATED RINGERS IV SOLN: INTRAVENOUS | Status: DC

## 2021-07-19 MED ORDER — GLUCAGON HCL RDNA (DIAGNOSTIC) 1 MG IJ SOLR
INTRAMUSCULAR | Status: AC
  Filled 2021-07-19: qty 2

## 2021-07-19 MED ORDER — GLUCAGON HCL RDNA (DIAGNOSTIC) 1 MG IJ SOLR
INTRAMUSCULAR | Status: DC | PRN
  Administered 2021-07-19: .5 mg via INTRAVENOUS

## 2021-07-19 MED ORDER — PHENYLEPHRINE 40 MCG/ML (10ML) SYRINGE FOR IV PUSH (FOR BLOOD PRESSURE SUPPORT)
PREFILLED_SYRINGE | INTRAVENOUS | Status: AC
  Filled 2021-07-19: qty 10

## 2021-07-19 MED ORDER — LIDOCAINE HCL (CARDIAC) PF 100 MG/5ML IV SOSY
PREFILLED_SYRINGE | INTRAVENOUS | Status: DC | PRN
  Administered 2021-07-19: 60 mg via INTRATRACHEAL

## 2021-07-19 MED ORDER — PROPOFOL 10 MG/ML IV BOLUS
INTRAVENOUS | Status: DC | PRN
  Administered 2021-07-19: 50 mg via INTRAVENOUS
  Administered 2021-07-19: 100 mg via INTRAVENOUS

## 2021-07-19 MED ORDER — LACTATED RINGERS IV SOLN: INTRAVENOUS | Status: DC | PRN

## 2021-07-19 NOTE — Op Note (Signed)
[facility] ?Patient Name: Brenda Harding ?Procedure Date: 07/19/2021 10:42 AM ?MRN: [id number] ?Date of Birth: [birth year] ?Attending MD: Maylon Peppers ,  ?CSN: [id number] ?Age: 66 ?Admit Type: Outpatient ?Procedure:                Upper GI endoscopy ?Indications:              Follow-up of gastro-esophageal reflux disease ?Providers:                Maylon Peppers, Crystal Page, Aram Candela ?Referring MD:              ?Medicines:                Monitored Anesthesia Care ?Complications:            No immediate complications. ?Estimated Blood Loss:     Estimated blood loss: none. ?Procedure:                Pre-Anesthesia Assessment: ?                          - Prior to the procedure, a History and Physical  ?                          was performed, and patient medications, allergies  ?                          and sensitivities were reviewed. The patient's  ?                          tolerance of previous anesthesia was reviewed. ?                          - The risks and benefits of the procedure and the  ?                          sedation options and risks were discussed with the  ?                          patient. All questions were answered and informed  ?                          consent was obtained. ?                          - ASA Grade Assessment: II - A patient with mild  ?                          systemic disease. ?                          After obtaining informed consent, the endoscope was  ?                          passed under direct vision. Throughout the  ?                          procedure, the patient's blood  pressure, pulse, and  ?                          oxygen saturations were monitored continuously. The  ?                          GIF-H190 ([id number]) scope was introduced through the  ?                          mouth, and advanced to the second part of duodenum.  ?                          The upper GI endoscopy was accomplished without  ?                          difficulty. The  patient tolerated the procedure  ?                          well. ?Scope In: 10:57:41 AM ?Scope Out: 11:22:30 AM ?Total Procedure Duration: 0 hours 24 minutes 49 seconds  ?Findings: ?     A 1 cm hiatal hernia was present. ?     Multiple 4 to 8 mm sessile fundic gland polyps with no bleeding were  ?     found in the gastric fundus. ?     Localized mild inflammation characterized by erythema was found in the  ?     gastric antrum. Biopsies were taken with a cold forceps for Helicobacter  ?     pylori testing. ?     A small polypoid mass, consistent with adenoma, with no bleeding was  ?     found in the second portion of the duodenum. Area was successfully  ?     injected with 1 mL Eleview for a lift polypectomy. Imaging was performed  ?     using white light and narrow band imaging to visualize the mucosa and  ?     demarcate the polyp site after injection for EMR purposes. The polyp was  ?     removed with a piecemeal technique using a cold snare. Resection and  ?     retrieval were complete. To close a defect after polypectomy, two  ?     hemostatic clips were successfully placed. There was no bleeding at the  ?     end of the procedure. ?Impression:               - 1 cm hiatal hernia. ?                          - Multiple fundic gland polyps. ?                          - Gastritis. Biopsied. ?                          - Mass (suspected adenoma) in the second portion of  ?                          the duodenum. Injected. Resected via EMR. Clips  ?  were placed. ?Moderate Sedation: ?     Per Anesthesia Care ?Recommendation:           - Discharge patient to home (ambulatory). ?                          - Resume previous diet. ?                          - Await pathology results. ?                          - Continue present medications. ?                          - Repeat upper endoscopy in 6 months for  ?                          surveillance given piecemeal EMR resection. ?Procedure Code(s):         --- Professional --- ?                          31540, 59, Esophagogastroduodenoscopy, flexible,  ?                          transoral; with removal of tumor(s), polyp(s), or  ?                          other lesion(s) by snare technique ?                          43239, 59, Esophagogastroduodenoscopy, flexible,  ?                          transoral; with biopsy, single or multiple ?                          43236, 59, Esophagogastroduodenoscopy, flexible,  ?                          transoral; with directed submucosal injection(s),  ?                          any substance ?Diagnosis Code(s):        --- Professional --- ?                          K44.9, Diaphragmatic hernia without obstruction or  ?                          gangrene ?                          K31.7, Polyp of stomach and duodenum ?                          K29.70, Gastritis, unspecified, without bleeding ?  K31.89, Other diseases of stomach and duodenum ?                          K21.9, Gastro-esophageal reflux disease without  ?                          esophagitis ?CPT copyright 2019 American Medical Association. All rights reserved. ?The codes documented in this report are preliminary and upon coder review may  ?be revised to meet current compliance requirements. ?Maylon Peppers, MD ?Maylon Peppers,  ?07/19/2021 12:15:49 PM ?This report has been signed electronically. ?Number of Addenda: 0 ?

## 2021-07-19 NOTE — Discharge Instructions (Signed)
You are being discharged to home.  ?Resume your previous diet.  ?We are waiting for your pathology results.  ?Continue your present medications.  ?Your physician has recommended a repeat upper endoscopy in six months for surveillance given piecemeal EMR resection.  ?Your physician has recommended a repeat colonoscopy in two years for surveillance.  ?

## 2021-07-19 NOTE — Op Note (Signed)
[facility] ?Patient Name: Brenda Harding ?Procedure Date: 07/19/2021 10:41 AM ?MRN: [id number] ?Date of Birth: January 12, 1956 ?Attending MD: Maylon Peppers ,  ?CSN: [id number] ?Age: 66 ?Admit Type: Outpatient ?Procedure:                Colonoscopy ?Indications:              Surveillance: Personal history of colonic polyps  ?                          (unknown histology) on last colonoscopy more than 5  ?                          years ago ?Providers:                Maylon Peppers, Crystal Page, Aram Candela ?Referring MD:              ?Medicines:                Monitored Anesthesia Care ?Complications:            No immediate complications. ?Estimated Blood Loss:     Estimated blood loss: none. ?Procedure:                Pre-Anesthesia Assessment: ?                          - Prior to the procedure, a History and Physical  ?                          was performed, and patient medications, allergies  ?                          and sensitivities were reviewed. The patient's  ?                          tolerance of previous anesthesia was reviewed. ?                          - The risks and benefits of the procedure and the  ?                          sedation options and risks were discussed with the  ?                          patient. All questions were answered and informed  ?                          consent was obtained. ?                          - ASA Grade Assessment: II - A patient with mild  ?                          systemic disease. ?                          After obtaining informed consent, the colonoscope  ?  was passed under direct vision. Throughout the  ?                          procedure, the patient's blood pressure, pulse, and  ?                          oxygen saturations were monitored continuously. The  ?                          PCF-HQ190L ([id number]) scope was introduced through  ?                          the anus and advanced to the the cecum, identified  ?                           by appendiceal orifice and ileocecal valve. The  ?                          colonoscopy was performed without difficulty. The  ?                          patient tolerated the procedure well. The quality  ?                          of the bowel preparation was adequate to identify  ?                          polyps 6 mm and larger in size. ?Scope In: 11:27:35 AM ?Scope Out: 12:11:12 PM ?Scope Withdrawal Time: 0 hours 37 minutes 35 seconds  ?Total Procedure Duration: 0 hours 43 minutes 37 seconds  ?Findings: ?     Hemorrhoids were found on perianal exam. ?     Nine sessile polyps were found in the transverse colon, ascending colon  ?     and cecum. The polyps were 2 to 8 mm in size. These polyps were removed  ?     with a cold snare. Resection and retrieval were complete. ?     Three sessile polyps were found in the sigmoid colon and descending  ?     colon. The polyps were 2 to 5 mm in size. These polyps were removed with  ?     a cold snare. Resection and retrieval were complete. ?     Scattered small and large-mouthed diverticula were found in the sigmoid  ?     colon and ascending colon. ?     Non-bleeding external internal hemorrhoids were found during  ?     retroflexion. The hemorrhoids were medium-sized. ?Impression:               - Hemorrhoids found on perianal exam. ?                          - Nine 2 to 8 mm polyps in the transverse colon, in  ?                          the ascending colon and in the cecum, removed with  ?  a cold snare. Resected and retrieved. ?                          - Three 2 to 5 mm polyps in the sigmoid colon and  ?                          in the descending colon, removed with a cold snare.  ?                          Resected and retrieved. ?                          - Diverticulosis in the sigmoid colon and in the  ?                          ascending colon. ?                          - Non-bleeding external internal hemorrhoids. ?Moderate  Sedation: ?     Per Anesthesia Care ?Recommendation:           - Discharge patient to home (ambulatory). ?                          - Resume previous diet. ?                          - Await pathology results. ?                          - Repeat colonoscopy in 2 years for surveillance. ?Procedure Code(s):        --- Professional --- ?                          [phone], Colonoscopy, flexible; with removal of  ?                          tumor(s), polyp(s), or other lesion(s) by snare  ?                          technique ?Diagnosis Code(s):        --- Professional --- ?                          Z86.010, Personal history of colonic polyps ?                          K64.4, Residual hemorrhoidal skin tags ?                          K64.8, Other hemorrhoids ?                          K63.5, Polyp of colon ?                          K57.30, Diverticulosis of large intestine without  ?  perforation or abscess without bleeding ?CPT copyright 2019 [employer]. All rights reserved. ?The codes documented in this report are preliminary and upon coder review may  ?be revised to meet current compliance requirements. ?Maylon Peppers, MD ?Maylon Peppers,  ?07/19/2021 12:21:01 PM ?This report has been signed electronically. ?Number of Addenda: 0 ?

## 2021-07-19 NOTE — Anesthesia Postprocedure Evaluation (Signed)
Anesthesia Post Note ? ?Patient: Brenda Harding ? ?Procedure(s) Performed: COLONOSCOPY WITH PROPOFOL ?ESOPHAGOGASTRODUODENOSCOPY (EGD) WITH PROPOFOL ?POLYPECTOMY ?HEMOSTASIS CLIP PLACEMENT ? ?Patient location during evaluation: Phase II ?Anesthesia Type: General ?Level of consciousness: awake and alert and oriented ?Pain management: pain level controlled ?Vital Signs Assessment: post-procedure vital signs reviewed and stable ?Respiratory status: spontaneous breathing, nonlabored ventilation and respiratory function stable ?Cardiovascular status: blood pressure returned to baseline and stable ?Postop Assessment: no apparent nausea or vomiting ?Anesthetic complications: no ? ? ?No notable events documented. ? ? ?Last Vitals:  ?Vitals:  ? 07/19/21 0948 07/19/21 1215  ?BP: 125/82 99/65  ?Pulse: 95 94  ?Resp: (!) 25 (!) 26  ?Temp: 37 ?C 36.7 ?C  ?SpO2: 98% 98%  ?  ?Last Pain:  ?Vitals:  ? 07/19/21 1215  ?TempSrc: Oral  ?PainSc: 0-No pain  ? ? ?  ?  ?  ?  ?  ?  ? ?Eric Morganti C Demetries Coia ? ? ? ? ?

## 2021-07-19 NOTE — Anesthesia Preprocedure Evaluation (Addendum)
Anesthesia Evaluation  ?Patient identified by MRN, date of birth, ID band ?Patient awake ? ? ? ?Reviewed: ?Allergy & Precautions, NPO status , Patient's Chart, lab work & pertinent test results ? ?Airway ?Mallampati: III ? ?TM Distance: >3 FB ?Neck ROM: Full ? ? ? Dental ? ?(+) Dental Advisory Given, Teeth Intact ?  ?Pulmonary ?COPD (chronic bronchitis ),  COPD inhaler,  ?  ?Pulmonary exam normal ?breath sounds clear to auscultation ? ? ? ? ? ? Cardiovascular ?Exercise Tolerance: Good ?hypertension, Pt. on medications ?Normal cardiovascular exam ?Rhythm:Regular Rate:Normal ? ? ?  ?Neuro/Psych ? Headaches, PSYCHIATRIC DISORDERS Anxiety   ? GI/Hepatic ?Neg liver ROS, GERD  Medicated and Controlled,  ?Endo/Other  ?negative endocrine ROS ? Renal/GU ?Renal InsufficiencyRenal disease  ?negative genitourinary ?  ?Musculoskeletal ? ?(+) Arthritis , Osteoarthritis,   ? Abdominal ?  ?Peds ?negative pediatric ROS ?(+)  Hematology ?negative hematology ROS ?(+)   ?Anesthesia Other Findings ?Snoring  ? Reproductive/Obstetrics ?negative OB ROS ? ?  ? ? ? ? ? ? ? ? ? ? ? ? ? ?  ?  ? ? ? ? ? ? ?Anesthesia Physical ?Anesthesia Plan ? ?ASA: 2 ? ?Anesthesia Plan: General  ? ?Post-op Pain Management: Minimal or no pain anticipated  ? ?Induction: Intravenous ? ?PONV Risk Score and Plan: TIVA ? ?Airway Management Planned: Nasal Cannula and Natural Airway ? ?Additional Equipment:  ? ?Intra-op Plan:  ? ?Post-operative Plan:  ? ?Informed Consent: I have reviewed the patients History and Physical, chart, labs and discussed the procedure including the risks, benefits and alternatives for the proposed anesthesia with the patient or authorized representative who has indicated his/her understanding and acceptance.  ? ? ? ?Dental advisory given ? ?Plan Discussed with: CRNA and Surgeon ? ?Anesthesia Plan Comments:   ? ? ? ? ? ? ?Anesthesia Quick Evaluation ? ?

## 2021-07-19 NOTE — H&P (Signed)
Brenda Harding is an 66 y.o. female.   ?Chief Complaint: Barrett's esophagus screening, GERD and history of colon polyps. ?HPI: Brenda Harding is a 66 y.o. female with PMH breast cancer s/p lumpectomy and chemoradiation therapy, GERD, IBS, who presents for evaluation of Barrett's esophagus screening, GERD and history of colon polyps. ? ?Patient has presented intermittent episodes of heartburn when eating sweets.  Overall she has felt adequate control of her GERD while taking Prevacid 30 mg every day and Pepcid at night. ? ?Last Colonoscopy:2014 - reports she had a polyp in the past, but no report is available. ? ?Past Medical History:  ?Diagnosis Date  ? Anxiety   ? Arthritis   ? Breast cancer ([place])   ? Bronchitis   ? HISTORY   ? Cough productive of purulent sputum   ? LIGHT GREEN   ? Early cataracts, bilateral   ? GERD (gastroesophageal reflux disease)   ? Headache   ? History of wheezing   ? Hypertension   ? Malignant neoplasm of upper-outer quadrant of right female breast ([place]) 02/25/2016  ? Personal history of chemotherapy   ? Personal history of radiation therapy   ? Scoliosis   ? ? ?Past Surgical History:  ?Procedure Laterality Date  ? BACK SURGERY    ? scoliosis spinal surgery 1970  ? benign tumor removal    ? 1978  ? BREAST BIOPSY    ? BREAST LUMPECTOMY Right   ? 08/09/2016  ? BREAST LUMPECTOMY WITH RADIOACTIVE SEED AND SENTINEL LYMPH NODE BIOPSY Right 08/09/2016  ? Procedure: RIGHT BREAST LUMPECTOMY WITH RADIOACTIVE SEED AND SENTINEL LYMPH NODE BIOPSY;  Surgeon: [name], MD;  Location: [place];  Service: General;  Laterality: Right;  ? CARPAL TUNNEL RELEASE    ? PORT-A-CATH REMOVAL Left 03/08/2017  ? Procedure: REMOVAL PORT-A-CATH;  Surgeon: [name], MD;  Location: [place];  Service: General;  Laterality: Left;  ? PORTACATH PLACEMENT Left 03/21/2016  ? Procedure: INSERTION PORT-A-CATH;  Surgeon: [name], MD;  Location: [place];  Service: General;  Laterality: Left;  ? TRIGGER FINGER RELEASE     ? ? ?Family History  ?Problem Relation Age of Onset  ? Breast cancer Maternal Aunt   ? Breast cancer Cousin   ? Colon cancer Cousin   ? ?Social History:  reports that she has never smoked. She has never used smokeless tobacco. She reports that she does not drink alcohol and does not use drugs. ? ?Allergies:  ?Allergies  ?Allergen Reactions  ? Propoxyphene Other (See Comments)  ?  Passed out, gets dizzy  ? Inapsine [Droperidol]   ?  PATIENT PREFERENCE ?PATIENT HAS HAD NO REACTION TO THIS ?Pt does not want to be given this, sister had allergy to this  ? Latex Itching  ? Levaquin [Levofloxacin] Diarrhea and Nausea And Vomiting  ? ? ?Medications Prior to Admission  ?Medication Sig Dispense Refill  ? albuterol (PROVENTIL HFA;VENTOLIN HFA) 108 (90 Base) MCG/ACT inhaler Inhale 2 puffs into the lungs 2 (two) times daily as needed for wheezing or shortness of breath.     ? ALPRAZolam (XANAX) 0.5 MG tablet Take 1 tablet (0.5 mg total) by mouth 2 (two) times daily as needed (for anxiety). 60 tablet 0  ? anastrozole (ARIMIDEX) 1 MG tablet Take 1 tablet (1 mg total) by mouth at bedtime. 90 tablet 3  ? betamethasone dipropionate (DIPROLENE) 0.05 % cream Apply 1 application topically 2 (two) times a week. Applied to vagina on Mon and Thurs    ?  Biotin 1 MG CAPS Take 1 capsule by mouth daily. 30 capsule   ? BREO ELLIPTA 200-25 MCG/INH AEPB Inhale 1 puff into the lungs daily.    ? Cholecalciferol (VITAMIN D3) 50 MCG (2000 UT) capsule Take 1 capsule (2,000 Units total) by mouth daily.    ? famotidine (PEPCID) 20 MG tablet Take 1 tablet (20 mg total) by mouth 2 (two) times daily. (Patient taking differently: Take 20 mg by mouth at bedtime.)    ? guaiFENesin (MUCINEX) 600 MG 12 hr tablet Take 600 mg by mouth 2 (two) times daily as needed for cough or to loosen phlegm.     ? Krill Oil 500 MG CAPS Take 500 mg by mouth daily.    ? lansoprazole (PREVACID) 30 MG capsule Take 30 mg by mouth in the morning.    ? levocetirizine (XYZAL) 5 MG  tablet Take 5 mg by mouth every evening.    ? montelukast (SINGULAIR) 10 MG tablet Take 10 mg by mouth daily.     ? PREMARIN vaginal cream Place 1 Applicatorful vaginally 2 (two) times a week. Tuesday & Saturday    ? risedronate (ACTONEL) 150 MG tablet Take 150 mg by mouth every 30 (thirty) days. with water on empty stomach, nothing by mouth or lie down for next 30 minutes.    ? rosuvastatin (CRESTOR) 10 MG tablet Take 1 tablet (10 mg total) by mouth daily.    ? spironolactone (ALDACTONE) 50 MG tablet Take 50 mg by mouth 2 (two) times daily.     ? Triamcinolone Acetonide (NASACORT ALLERGY 24HR NA) Place 2 sprays into the nose daily.     ? Turmeric 500 MG CAPS Take 500 mg by mouth daily. (Patient taking differently: Take 1,000 mg by mouth daily.)    ? valsartan (DIOVAN) 160 MG tablet Take 80 mg by mouth daily.    ? verapamil (CALAN-SR) 240 MG CR tablet Take 240 mg by mouth at bedtime.    ? acetaminophen (TYLENOL) 650 MG CR tablet Take 1,300 mg by mouth every 8 (eight) hours as needed for pain.    ? minoxidil (LONITEN) 10 MG tablet Take 1 tablet (10 mg total) by mouth daily. (Patient not taking: Reported on 06/16/2021) 90 tablet 3  ? Na Sulfate-K Sulfate-Mg Sulf (SUPREP BOWEL PREP KIT) 17.5-3.13-1.6 GM/177ML SOLN Take 1 kit by mouth as directed. 354 mL 0  ? valsartan-hydrochlorothiazide (DIOVAN HCT) 160-12.5 MG tablet Take 1 tablet by mouth daily. (Patient not taking: Reported on 06/23/2021)    ? verapamil (CALAN-SR) 180 MG CR tablet Take 2 tablets (360 mg total) by mouth daily. (Patient not taking: Reported on 06/23/2021)    ? ? ?Results for orders placed or performed during the hospital encounter of 07/19/21 (from the past 48 hour(s))  ?Basic Metabolic Panel     Status: Abnormal  ? Collection Time: 07/19/21  9:55 AM  ?Result Value Ref Range  ? Sodium 139 135 - 145 mmol/L  ? Potassium 4.0 3.5 - 5.1 mmol/L  ? Chloride 104 98 - 111 mmol/L  ? CO2 24 22 - 32 mmol/L  ? Glucose, Bld 86 70 - 99 mg/dL  ?  Comment: Glucose  reference range applies only to samples taken after fasting for at least 8 hours.  ? BUN 19 8 - 23 mg/dL  ? Creatinine, Ser 1.26 (H) 0.44 - 1.00 mg/dL  ? Calcium 9.1 8.9 - 10.3 mg/dL  ? GFR, Estimated 47 (L) >60 mL/min  ?  Comment: (NOTE) ?Calculated using  the CKD-EPI Creatinine Equation (2021) ?  ? Anion gap 11 5 - 15  ?  Comment: Performed at [facility], [address] [place], [place] [address]  ? ?No results found. ? ?Review of Systems  ?Constitutional: Negative.   ?HENT: Negative.    ?Eyes: Negative.   ?Respiratory: Negative.    ?Cardiovascular: Negative.   ?Gastrointestinal: Negative.   ?Endocrine: Negative.   ?Genitourinary: Negative.   ?Musculoskeletal: Negative.   ?Skin: Negative.   ?Allergic/Immunologic: Negative.   ?Neurological: Negative.   ?Hematological: Negative.   ?Psychiatric/Behavioral: Negative.    ? ?Blood pressure 125/82, pulse 95, temperature 98.6 ?F (37 ?C), temperature source Oral, resp. rate (!) 25, height _0  (1.626 m), weight 78.1 kg, SpO2 98 %. ?Physical Exam  ?GENERAL: The patient is AO x3, in no acute distress. ?HEENT: Head is normocephalic and atraumatic. EOMI are intact. Mouth is well hydrated and without lesions. ?NECK: Supple. No masses ?LUNGS: Clear to auscultation. No presence of rhonchi/wheezing/rales. Adequate chest expansion ?HEART: RRR, normal s1 and s2. ?ABDOMEN: Soft, nontender, no guarding, no peritoneal signs, and nondistended. BS +. No masses. ?EXTREMITIES: Without any cyanosis, clubbing, rash, lesions or edema. ?NEUROLOGIC: AOx3, no focal motor deficit. ?SKIN: no jaundice, no rashes ? ?Assessment/Plan ?Brenda Harding is a 66 y.o. female with PMH breast cancer s/p lumpectomy and chemoradiation therapy, GERD, IBS, who presents for evaluation of Barrett's esophagus screening, GERD and history of colon polyps.  We will proceed with EGD and colonoscopy. ? ?Harvel Quale, MD ?07/19/2021, 10:34 AM ? ? ? ?

## 2021-07-19 NOTE — Transfer of Care (Signed)
Immediate Anesthesia Transfer of Care Note ? ?Patient: Brenda Harding ? ?Procedure(s) Performed: COLONOSCOPY WITH PROPOFOL ?ESOPHAGOGASTRODUODENOSCOPY (EGD) WITH PROPOFOL ?POLYPECTOMY ?HEMOSTASIS CLIP PLACEMENT ? ?Patient Location: Short Stay ? ?Anesthesia Type:General ? ?Level of Consciousness: awake ? ?Airway & Oxygen Therapy: Patient Spontanous Breathing ? ?Post-op Assessment: Report given to RN and Post -op Vital signs reviewed and stable ? ?Post vital signs: Reviewed and stable ? ?Last Vitals:  ?Vitals Value Taken Time  ?BP 99/65 07/19/21 1215  ?Temp 36.7 ?C 07/19/21 1215  ?Pulse 94 07/19/21 1215  ?Resp 26 07/19/21 1215  ?SpO2 98 % 07/19/21 1215  ? ? ?Last Pain:  ?Vitals:  ? 07/19/21 1215  ?TempSrc: Oral  ?PainSc: 0-No pain  ?   ? ?  ? ?Complications: No notable events documented. ?

## 2021-07-20 LAB — SURGICAL PATHOLOGY

## 2021-07-26 ENCOUNTER — Encounter (HOSPITAL_COMMUNITY): Payer: Self-pay | Admitting: Gastroenterology

## 2021-08-30 NOTE — Progress Notes (Signed)
? ?Patient Care Team: ?Tobe Sos, MD as PCP - General (Internal Medicine) ?Maisie Fus, MD as Consulting Physician (Obstetrics and Gynecology) ?[name], MD as Consulting Physician (General Surgery) ?Nicholas Lose, MD as Consulting Physician (Hematology and Oncology) ?Kyung Rudd, MD as Consulting Physician (Radiation Oncology) ?Gardenia Phlegm, NP as Nurse Practitioner (Hematology and Oncology) ? ?DIAGNOSIS:  ?Encounter Diagnosis  ?Name Primary?  ? Malignant neoplasm of upper-outer quadrant of right breast in female, estrogen receptor positive ([place])   ? ? ?SUMMARY OF ONCOLOGIC HISTORY: ?Oncology History  ?Malignant neoplasm of upper-outer quadrant of right female breast ([place])  ?02/24/2016 Initial Diagnosis  ? Right breast biopsy 1:00, grade 2-3 IDC, ER 5%, PR 0%, Ki-67 90%, HER-2 positive ratio 7.28, right breast biopsy 1:00 1.5 cm medial to the dominant mass fibrocystic changes; ultrasound and mammogram 02/23/2016: 2.5 cm mass at 1:00 position and a small 77m irregular mass 1.5 cm medial to the larger mass, T2 N0 stage II a clinical stage ? ?  ?03/04/2016 Breast MRI  ? 2.9 cm irregular enhancing mass located within the right breast at 1:00 position, several T2 bright lesions in the right lobe of the liver incompletely visualized could be hepatic cysts ? ?  ?03/22/2016 - 07/05/2016 Neo-Adjuvant Chemotherapy  ? Neo-Adj TCHP ?5 cycles followed by Herceptin and Perjeta ? ?  ?07/07/2016 Breast MRI  ? Right breast cancer: No measurable enhancement, residual 1-2 mm foci; left breast partially cystic lesion at 2:30 position 2.1 cm which is new 1 indeterminate left axillary lymph node ? ?  ?08/09/2016 Surgery  ? Right lumpectomy: 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, no residual invasive cancer, 0/4 lymph nodes negative, ER 5% week, PR negative, HER-2 positive, Ki-67 90%: Complete pathologic response ypTis ypN0 ? ?  ?09/11/2016 - 10/09/2016 Radiation Therapy  ? 1) Right breast: 42.5 Gy in 16 fractions  2) Right breast  boost: 10 Gy in 4 fractions ? ? ?  ?01/28/2017 -  Anti-estrogen oral therapy  ? Anastrozole 1 mg daily (prescribed in July, and started in end of September) ? ?  ? ? ?CHIEF COMPLIANT: Follow-up of right breast cancer on anastrozole  ? ?INTERVAL HISTORY: Brenda LAUFis a  66y.o. with above-mentioned history of right breast cancer treated with neoadjuvant chemotherapy, lumpectomy, radiation, and is currently on antiestrogen therapy with anastrozole. She presents to the clinic today for a follow-up.  She is complaining of thinning of hair as well as weight gain.  She is hoping to be done with anastrozole after the end of 5 years. ? ? ? ?ALLERGIES:  is allergic to propoxyphene, inapsine [droperidol], latex, and levaquin [levofloxacin]. ? ?MEDICATIONS:  ?Current Outpatient Medications  ?Medication Sig Dispense Refill  ? acetaminophen (TYLENOL) 650 MG CR tablet Take 1,300 mg by mouth every 8 (eight) hours as needed for pain.    ? albuterol (PROVENTIL HFA;VENTOLIN HFA) 108 (90 Base) MCG/ACT inhaler Inhale 2 puffs into the lungs 2 (two) times daily as needed for wheezing or shortness of breath.     ? ALPRAZolam (XANAX) 0.5 MG tablet Take 1 tablet (0.5 mg total) by mouth 2 (two) times daily as needed (for anxiety). 60 tablet 0  ? anastrozole (ARIMIDEX) 1 MG tablet Take 1 tablet (1 mg total) by mouth at bedtime. 90 tablet 3  ? betamethasone dipropionate (DIPROLENE) 0.05 % cream Apply 1 application topically 2 (two) times a week. Applied to vagina on Mon and Thurs    ? Biotin 1 MG CAPS Take 1 capsule by  mouth daily. 30 capsule   ? BREO ELLIPTA 200-25 MCG/INH AEPB Inhale 1 puff into the lungs daily.    ? Cholecalciferol (VITAMIN D3) 50 MCG (2000 UT) capsule Take 1 capsule (2,000 Units total) by mouth daily.    ? famotidine (PEPCID) 20 MG tablet Take 1 tablet (20 mg total) by mouth 2 (two) times daily. (Patient taking differently: Take 20 mg by mouth at bedtime.)    ? guaiFENesin (MUCINEX) 600 MG 12 hr tablet Take 600 mg by  mouth 2 (two) times daily as needed for cough or to loosen phlegm.     ? Krill Oil 500 MG CAPS Take 500 mg by mouth daily.    ? lansoprazole (PREVACID) 30 MG capsule Take 30 mg by mouth in the morning.    ? levocetirizine (XYZAL) 5 MG tablet Take 5 mg by mouth every evening.    ? minoxidil (LONITEN) 10 MG tablet Take 1 tablet (10 mg total) by mouth daily. (Patient not taking: Reported on 06/16/2021) 90 tablet 3  ? montelukast (SINGULAIR) 10 MG tablet Take 10 mg by mouth daily.     ? PREMARIN vaginal cream Place 1 Applicatorful vaginally 2 (two) times a week. Tuesday & Saturday    ? risedronate (ACTONEL) 150 MG tablet Take 150 mg by mouth every 30 (thirty) days. with water on empty stomach, nothing by mouth or lie down for next 30 minutes.    ? rosuvastatin (CRESTOR) 10 MG tablet Take 1 tablet (10 mg total) by mouth daily.    ? spironolactone (ALDACTONE) 50 MG tablet Take 50 mg by mouth 2 (two) times daily.     ? Triamcinolone Acetonide (NASACORT ALLERGY 24HR NA) Place 2 sprays into the nose daily.     ? Turmeric 500 MG CAPS Take 500 mg by mouth daily. (Patient taking differently: Take 1,000 mg by mouth daily.)    ? valsartan (DIOVAN) 160 MG tablet Take 80 mg by mouth daily.    ? verapamil (CALAN-SR) 180 MG CR tablet Take 2 tablets (360 mg total) by mouth daily. (Patient not taking: Reported on 06/23/2021)    ? verapamil (CALAN-SR) 240 MG CR tablet Take 240 mg by mouth at bedtime.    ? ?No current facility-administered medications for this visit.  ? ?Facility-Administered Medications Ordered in Other Visits  ?Medication Dose Route Frequency Provider Last Rate Last Admin  ? sodium chloride flush (NS) 0.9 % injection 10 mL  10 mL Intravenous PRN Nicholas Lose, MD   10 mL at 09/27/16 1326  ? sodium chloride flush (NS) 0.9 % injection 10 mL  10 mL Intracatheter PRN Nicholas Lose, MD   10 mL at 12/25/16 1450  ? ? ?PHYSICAL EXAMINATION: ?ECOG PERFORMANCE STATUS: 1 - Symptomatic but completely ambulatory ? ?Vitals:  ?  09/13/21 1454  ?BP: (!) 146/67  ?Pulse: 87  ?Resp: 18  ?Temp: (!) 97.3 ?F (36.3 ?C)  ?SpO2: 98%  ? ?Filed Weights  ? 09/13/21 1454  ?Weight: 176 lb (79.8 kg)  ? ? ?BREAST: No palpable masses or nodules in either right or left breasts. No palpable axillary supraclavicular or infraclavicular adenopathy no breast tenderness or nipple discharge. (exam performed in the presence of a chaperone) ? ?LABORATORY DATA:  ?I have reviewed the data as listed ? ?  Latest Ref Rng & Units 07/19/2021  ?  9:55 AM 09/09/2018  ? 11:14 AM 08/27/2017  ? 11:16 AM  ?CMP  ?Glucose 70 - 99 mg/dL 86   83   89    ?  BUN 8 - 23 mg/dL '19   22   19    ' ?Creatinine 0.44 - 1.00 mg/dL 1.26   1.28   1.22    ?Sodium 135 - 145 mmol/L 139   140   140    ?Potassium 3.5 - 5.1 mmol/L 4.0   4.2   4.0    ?Chloride 98 - 111 mmol/L 104   101   104    ?CO2 22 - 32 mmol/L '24   29   28    ' ?Calcium 8.9 - 10.3 mg/dL 9.1   9.6   9.8    ?Total Protein 6.5 - 8.1 g/dL  7.4   7.2    ?Total Bilirubin 0.3 - 1.2 mg/dL  0.5   0.5    ?Alkaline Phos 38 - 126 U/L  120   119    ?AST 15 - 41 U/L  16   17    ?ALT 0 - 44 U/L  17   15    ? ? ?Lab Results  ?Component Value Date  ? WBC 5.2 09/09/2019  ? HGB 12.4 09/09/2019  ? HCT 37.5 09/09/2019  ? MCV 100.3 (H) 09/09/2019  ? PLT 220 09/09/2019  ? NEUTROABS 3.0 09/09/2019  ? ? ?ASSESSMENT & PLAN:  ?Malignant neoplasm of upper-outer quadrant of right female breast ([place]) ?Right breast biopsy 1:00, grade 2-3 IDC, ER 5%, PR 0%, Ki-67 90%, HER-2 positive ratio 7.28, right breast biopsy 1:00 1.5 cm medial to the dominant mass fibrocystic changes; ultrasound and mammogram 02/23/2016: 2.5 cm mass at 1:00 position and a small 26m irregular mass 1.5 cm medial to the larger mass, T2 N0 stage II a clinical stage ?  ?Treatment summary: ?1. Neoadjuvant chemotherapy with [place]Perjeta ?6 cycles followed by Herceptin and Perjeta maintenance for 1 year 03/22/2016- 07/05/2017 ?2. Breast conserving surgery with sentinel lymph node study: 08/08/2016 ?3. Followed  by adjuvant radiation therapy 09/11/16- 10/09/16 ?4. Followed by antiestrogen therapy started July 2018 ?  ?Right lumpectomy 08/08/2016: 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, no residual invasive cancer, 0/4 lymph nodes ne

## 2021-09-13 ENCOUNTER — Inpatient Hospital Stay: Payer: Medicare Other | Attending: Hematology and Oncology | Admitting: Hematology and Oncology

## 2021-09-13 ENCOUNTER — Encounter: Payer: Self-pay | Admitting: *Deleted

## 2021-09-13 ENCOUNTER — Other Ambulatory Visit: Payer: Self-pay

## 2021-09-13 DIAGNOSIS — M2560 Stiffness of unspecified joint, not elsewhere classified: Secondary | ICD-10-CM | POA: Insufficient documentation

## 2021-09-13 DIAGNOSIS — C50411 Malignant neoplasm of upper-outer quadrant of right female breast: Secondary | ICD-10-CM

## 2021-09-13 DIAGNOSIS — M810 Age-related osteoporosis without current pathological fracture: Secondary | ICD-10-CM | POA: Insufficient documentation

## 2021-09-13 DIAGNOSIS — Z853 Personal history of malignant neoplasm of breast: Secondary | ICD-10-CM | POA: Diagnosis not present

## 2021-09-13 DIAGNOSIS — R5383 Other fatigue: Secondary | ICD-10-CM | POA: Diagnosis not present

## 2021-09-13 DIAGNOSIS — Z170 Estrogen receptor positive status [ER+]: Secondary | ICD-10-CM | POA: Diagnosis not present

## 2021-09-13 DIAGNOSIS — Z79811 Long term (current) use of aromatase inhibitors: Secondary | ICD-10-CM | POA: Insufficient documentation

## 2021-09-13 NOTE — Progress Notes (Signed)
Per MD request, RN successfully faxed [facility] testing request ([phone]). 

## 2021-09-13 NOTE — Assessment & Plan Note (Signed)
Right breast biopsy 1:00, grade 2-3 IDC, ER 5%, PR 0%, Ki-67 90%, HER-2 positive ratio 7.28, right breast biopsy 1:00 1.5 cm medial to the dominant mass fibrocystic changes; ultrasound and mammogram 02/23/2016: 2.5 cm mass at 1:00 position and a small 60m irregular mass 1.5 cm medial to the larger mass, T2 N0 stage II a clinical stage ?? ?Treatment summary: ?1. Neoadjuvant chemotherapy with [place]jeta ?6 cycles followed by Herceptin?and Perjeta?maintenance for 1 year 03/22/2016- 07/05/2017 ?2. Breast conserving surgery with sentinel lymph node study: 08/08/2016 ?3. Followed by adjuvant radiation therapy 09/11/16- 10/09/16 ?4. Followed by antiestrogen therapy?started July 2018 ?? ?Right lumpectomy 08/08/2016: 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, no residual invasive cancer, 0/4 lymph nodes negative, ER 5% weak, PR negative, HER-2 positive, Ki-67 90%: Complete pathologic response ypTis ypN0 ?------------------------------------------------------------------------------- ?Current Treatment:?Anastrozole started July 2018. ?? ?Anastrozole toxicities: ?Patient takes Premarin vaginal cream ?Mild fatigue ?Occasional hot flash ?Vaginal dryness:?Improved with vaginal creams ?Joint stiffness: Taking turmeric which is helping ?? ?Breast cancer surveillance: ?Mammograms?06/15/2021: Benign breast density category B ?Bone density 01/06/2021: T score - 3.5: Osteoporosis (worsening osteoporosis) ?Breast exam?  09/13/2021: Benign ?? ?  ?Return to clinic in 1 year for follow-up ?

## 2021-09-29 ENCOUNTER — Encounter: Payer: Self-pay | Admitting: Hematology and Oncology

## 2021-12-14 ENCOUNTER — Other Ambulatory Visit: Payer: Self-pay

## 2021-12-14 ENCOUNTER — Inpatient Hospital Stay: Payer: Medicare Other | Attending: Hematology and Oncology | Admitting: Hematology and Oncology

## 2021-12-14 ENCOUNTER — Encounter: Payer: Self-pay | Admitting: *Deleted

## 2021-12-14 VITALS — BP 154/79 | HR 86 | Temp 97.5°F | Resp 18 | Ht 64.0 in | Wt 172.2 lb

## 2021-12-14 DIAGNOSIS — Z170 Estrogen receptor positive status [ER+]: Secondary | ICD-10-CM

## 2021-12-14 DIAGNOSIS — Z79811 Long term (current) use of aromatase inhibitors: Secondary | ICD-10-CM | POA: Diagnosis not present

## 2021-12-14 DIAGNOSIS — Z780 Asymptomatic menopausal state: Secondary | ICD-10-CM | POA: Diagnosis not present

## 2021-12-14 DIAGNOSIS — M810 Age-related osteoporosis without current pathological fracture: Secondary | ICD-10-CM | POA: Diagnosis present

## 2021-12-14 DIAGNOSIS — C50411 Malignant neoplasm of upper-outer quadrant of right female breast: Secondary | ICD-10-CM

## 2021-12-14 DIAGNOSIS — Z853 Personal history of malignant neoplasm of breast: Secondary | ICD-10-CM | POA: Insufficient documentation

## 2021-12-14 NOTE — Progress Notes (Signed)
Patient Care Team: Tobe Sos, MD as PCP - General (Internal Medicine) Maisie Fus, MD (Inactive) as Consulting Physician (Obstetrics and Gynecology) [name], MD as Consulting Physician (General Surgery) Nicholas Lose, MD as Consulting Physician (Hematology and Oncology) Kyung Rudd, MD as Consulting Physician (Radiation Oncology) Delice Bison Charlestine Massed, NP as Nurse Practitioner (Hematology and Oncology)  DIAGNOSIS:  Encounter Diagnoses  Name Primary?   Malignant neoplasm of upper-outer quadrant of right breast in female, estrogen receptor positive ([place])    Post-menopausal Yes    SUMMARY OF ONCOLOGIC HISTORY: Oncology History  Malignant neoplasm of upper-outer quadrant of right female breast ([place])  02/24/2016 Initial Diagnosis   Right breast biopsy 1:00, grade 2-3 IDC, ER 5%, PR 0%, Ki-67 90%, HER-2 positive ratio 7.28, right breast biopsy 1:00 1.5 cm medial to the dominant mass fibrocystic changes; ultrasound and mammogram 02/23/2016: 2.5 cm mass at 1:00 position and a small 30m irregular mass 1.5 cm medial to the larger mass, T2 N0 stage II a clinical stage   03/04/2016 Breast MRI   2.9 cm irregular enhancing mass located within the right breast at 1:00 position, several T2 bright lesions in the right lobe of the liver incompletely visualized could be hepatic cysts   03/22/2016 - 07/05/2016 Neo-Adjuvant Chemotherapy   Neo-Adj TCHP 5 cycles followed by Herceptin and Perjeta   07/07/2016 Breast MRI   Right breast cancer: No measurable enhancement, residual 1-2 mm foci; left breast partially cystic lesion at 2:30 position 2.1 cm which is new 1 indeterminate left axillary lymph node   08/09/2016 Surgery   Right lumpectomy: 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, no residual invasive cancer, 0/4 lymph nodes negative, ER 5% week, PR negative, HER-2 positive, Ki-67 90%: Complete pathologic response ypTis ypN0   09/11/2016 - 10/09/2016 Radiation Therapy   1) Right breast: 42.5 Gy in 16  fractions  2) Right breast boost: 10 Gy in 4 fractions     01/28/2017 -  Anti-estrogen oral therapy   Anastrozole 1 mg daily (prescribed in July, and started in end of September)     CHIEF COMPLIANT:  Follow-up of right breast cancer on anastrozole   INTERVAL HISTORY: Brenda PONTILLOis a 66y.o. with above-mentioned history of right breast cancer treated with neoadjuvant chemotherapy, lumpectomy, radiation, and is currently on antiestrogen therapy with anastrozole. She presents to the clinic today for a follow-up. She states she has been doing good. She still having hair thinning. She complains of bumps on the back of her head. The vaginal dryness is under control. She still having joint stiffness. She complains of skin thinning.    ALLERGIES:  is allergic to propoxyphene, inapsine [droperidol], latex, and levaquin [levofloxacin].  MEDICATIONS:  Current Outpatient Medications  Medication Sig Dispense Refill   acetaminophen (TYLENOL) 650 MG CR tablet Take 1,300 mg by mouth every 8 (eight) hours as needed for pain.     albuterol (PROVENTIL HFA;VENTOLIN HFA) 108 (90 Base) MCG/ACT inhaler Inhale 2 puffs into the lungs 2 (two) times daily as needed for wheezing or shortness of breath.      ALPRAZolam (XANAX) 0.5 MG tablet Take 1 tablet (0.5 mg total) by mouth 2 (two) times daily as needed (for anxiety). 60 tablet 0   anastrozole (ARIMIDEX) 1 MG tablet Take 1 tablet (1 mg total) by mouth at bedtime. 90 tablet 3   betamethasone dipropionate (DIPROLENE) 0.05 % cream Apply 1 application topically 2 (two) times a week. Applied to vagina on Mon and [employer]  Biotin 1 MG CAPS Take 1 capsule by mouth daily. 30 capsule    BREO ELLIPTA 200-25 MCG/INH AEPB Inhale 1 puff into the lungs daily.     Cholecalciferol (VITAMIN D3) 50 MCG (2000 UT) capsule Take 1 capsule (2,000 Units total) by mouth daily.     famotidine (PEPCID) 20 MG tablet Take 1 tablet (20 mg total) by mouth 2 (two) times daily. (Patient  taking differently: Take 20 mg by mouth at bedtime.)     guaiFENesin (MUCINEX) 600 MG 12 hr tablet Take 600 mg by mouth 2 (two) times daily as needed for cough or to loosen phlegm.      Krill Oil 500 MG CAPS Take 500 mg by mouth daily.     lansoprazole (PREVACID) 30 MG capsule Take 30 mg by mouth in the morning.     levocetirizine (XYZAL) 5 MG tablet Take 5 mg by mouth every evening.     minoxidil (LONITEN) 10 MG tablet Take 1 tablet (10 mg total) by mouth daily. (Patient not taking: Reported on 06/16/2021) 90 tablet 3   montelukast (SINGULAIR) 10 MG tablet Take 10 mg by mouth daily.      PREMARIN vaginal cream Place 1 Applicatorful vaginally 2 (two) times a week. Tuesday & Saturday     risedronate (ACTONEL) 150 MG tablet Take 150 mg by mouth every 30 (thirty) days. with water on empty stomach, nothing by mouth or lie down for next 30 minutes.     rosuvastatin (CRESTOR) 10 MG tablet Take 1 tablet (10 mg total) by mouth daily.     spironolactone (ALDACTONE) 50 MG tablet Take 50 mg by mouth 2 (two) times daily.      Triamcinolone Acetonide (NASACORT ALLERGY 24HR NA) Place 2 sprays into the nose daily.      Turmeric 500 MG CAPS Take 500 mg by mouth daily. (Patient taking differently: Take 1,000 mg by mouth daily.)     valsartan (DIOVAN) 160 MG tablet Take 80 mg by mouth daily.     verapamil (CALAN-SR) 180 MG CR tablet Take 2 tablets (360 mg total) by mouth daily. (Patient not taking: Reported on 06/23/2021)     verapamil (CALAN-SR) 240 MG CR tablet Take 240 mg by mouth at bedtime.     No current facility-administered medications for this visit.   Facility-Administered Medications Ordered in Other Visits  Medication Dose Route Frequency Provider Last Rate Last Admin   sodium chloride flush (NS) 0.9 % injection 10 mL  10 mL Intravenous PRN Nicholas Lose, MD   10 mL at 09/27/16 1326   sodium chloride flush (NS) 0.9 % injection 10 mL  10 mL Intracatheter PRN Nicholas Lose, MD   10 mL at 12/25/16 1450     PHYSICAL EXAMINATION: ECOG PERFORMANCE STATUS: 1 - Symptomatic but completely ambulatory  Vitals:   12/14/21 1142  BP: (!) 154/79  Pulse: 86  Resp: 18  Temp: (!) 97.5 F (36.4 C)  SpO2: 99%   Filed Weights   12/14/21 1142  Weight: 172 lb 3.2 oz (78.1 kg)    BREAST: No palpable masses or nodules in either right or left breasts. No palpable axillary supraclavicular or infraclavicular adenopathy no breast tenderness or nipple discharge. (exam performed in the presence of a chaperone)  LABORATORY DATA:  I have reviewed the data as listed    Latest Ref Rng & Units 07/19/2021    9:55 AM 09/09/2018   11:14 AM 08/27/2017   11:16 AM  CMP  Glucose 70 -  99 mg/dL 86  83  89   BUN 8 - 23 mg/dL '19  22  19   ' Creatinine 0.44 - 1.00 mg/dL 1.26  1.28  1.22   Sodium 135 - 145 mmol/L 139  140  140   Potassium 3.5 - 5.1 mmol/L 4.0  4.2  4.0   Chloride 98 - 111 mmol/L 104  101  104   CO2 22 - 32 mmol/L '24  29  28   ' Calcium 8.9 - 10.3 mg/dL 9.1  9.6  9.8   Total Protein 6.5 - 8.1 g/dL  7.4  7.2   Total Bilirubin 0.3 - 1.2 mg/dL  0.5  0.5   Alkaline Phos 38 - 126 U/L  120  119   AST 15 - 41 U/L  16  17   ALT 0 - 44 U/L  17  15     Lab Results  Component Value Date   WBC 5.2 09/09/2019   HGB 12.4 09/09/2019   HCT 37.5 09/09/2019   MCV 100.3 (H) 09/09/2019   PLT 220 09/09/2019   NEUTROABS 3.0 09/09/2019    ASSESSMENT & PLAN:  Malignant neoplasm of upper-outer quadrant of right female breast ([place]) Right breast biopsy 1:00, grade 2-3 IDC, ER 5%, PR 0%, Ki-67 90%, HER-2 positive ratio 7.28, right breast biopsy 1:00 1.5 cm medial to the dominant mass fibrocystic changes; ultrasound and mammogram 02/23/2016: 2.5 cm mass at 1:00 position and a small 44m irregular mass 1.5 cm medial to the larger mass, T2 N0 stage II a clinical stage   Treatment summary: 1. Neoadjuvant chemotherapy with TCH Perjeta 6 cycles followed by Herceptin and Perjeta maintenance for 1 year 03/22/2016-  07/05/2017 2. Breast conserving surgery with sentinel lymph node study: 08/08/2016 3. Followed by adjuvant radiation therapy 09/11/16- 10/09/16 4. 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   Anastrozole toxicities: Mild fatigue 2. Occasional hot flash 3. Vaginal dryness: Improved with vaginal creams 4. Joint stiffness: Taking turmeric which is helping  Breast cancer index: Predicts that she would benefit from extended adjuvant therapy  Breast cancer surveillance: Mammograms 06/15/2021: Benign breast density category B Bone density 01/06/2021: T score - 3.1 (used to be -2.4): Osteoporosis  Breast exam   09/13/2021: Benign   Osteoporosis: I recommend that we obtain another bone density test in September this year.  If the bone density test is improving or stable then we can continue with anastrozole.  If it is getting worse then we have to discuss between adding Prolia versus discontinuation of anastrozole therapy.  Telephone visit after the bone density to discuss results. Return to clinic in 1 year for follow-up   Orders Placed This Encounter  Procedures   DG Bone Density    Standing Status:   Future    Standing Expiration Date:   12/15/2022    Order Specific Question:   Reason for Exam (SYMPTOM  OR DIAGNOSIS REQUIRED)    Answer:   ostoporosis    Order Specific Question:   Preferred imaging location?    Answer:   External    Comments:   [facility]   The patient has a good understanding of the overall plan. she agrees with it. she will call with any problems that may develop before the next visit  here. Total time spent: 30 mins including face  to face time and time spent for planning, charting and co-ordination of care   Harriette Ohara, MD 12/14/21    I Gardiner Coins am scribing for Dr. Lindi Adie  I have reviewed the above documentation for accuracy and completeness, and I agree with the above.

## 2021-12-14 NOTE — Progress Notes (Signed)
Pt requesting bone density order be faxed to [facility]  RN successfully faxed orders([phone]).

## 2021-12-14 NOTE — Assessment & Plan Note (Addendum)
Right breast biopsy 1:00, grade 2-3 IDC, ER 5%, PR 0%, Ki-67 90%, HER-2 positive ratio 7.28, right breast biopsy 1:00 1.5 cm medial to the dominant mass fibrocystic changes; ultrasound and mammogram 02/23/2016: 2.5 cm mass at 1:00 position and a small 75m irregular mass 1.5 cm medial to the larger mass, T2 N0 stage II a clinical stage  Treatment summary: 1. Neoadjuvant chemotherapy with TCH Perjeta 6 cycles followed by Herceptinand Perjetamaintenance for 1 year 03/22/2016- 07/05/2017 2. Breast conserving surgery with sentinel lymph node study: 08/08/2016 3. Followed by adjuvant radiation therapy 09/11/16- 10/09/16 4. Followed by antiestrogen therapystarted July 2018  Right lumpectomy 08/08/2016: 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, no residual invasive cancer, 0/4 lymph nodes negative, ER 5% weak, PR negative, HER-2 positive, Ki-67 90%: Complete pathologic response ypTis ypN0 ------------------------------------------------------------------------------- Current Treatment:Anastrozole started September 2018.  Anastrozole toxicities: 1. Mild fatigue 2. Occasional hot flash 3. Vaginal dryness:Improved with vaginal creams 4. Joint stiffness: Taking turmeric which is helping  Breast cancer index: Predicts that she would benefit from extended adjuvant therapy  Breast cancer surveillance: Mammograms2/15/2023: Benign breast density category B Bone density 01/06/2021: T score - 3.1 (used to be -2.4): Osteoporosis  Breast exam 09/13/2021: Benign  Return to clinic in 1 year for follow-up

## 2021-12-15 ENCOUNTER — Telehealth: Payer: Self-pay | Admitting: Hematology and Oncology

## 2021-12-15 NOTE — Telephone Encounter (Signed)
Scheduled appointment per 8/16 LOS. Left voicemail.

## 2021-12-16 ENCOUNTER — Encounter: Payer: Self-pay | Admitting: Hematology and Oncology

## 2021-12-19 ENCOUNTER — Other Ambulatory Visit: Payer: Self-pay

## 2021-12-19 DIAGNOSIS — M810 Age-related osteoporosis without current pathological fracture: Secondary | ICD-10-CM

## 2021-12-19 DIAGNOSIS — C50411 Malignant neoplasm of upper-outer quadrant of right female breast: Secondary | ICD-10-CM

## 2021-12-19 NOTE — Progress Notes (Signed)
Corrected bone density order entered and faxed to [facility] per pt request. Fax confirmation received.

## 2021-12-23 ENCOUNTER — Encounter (INDEPENDENT_AMBULATORY_CARE_PROVIDER_SITE_OTHER): Payer: Self-pay | Admitting: *Deleted

## 2022-01-18 ENCOUNTER — Encounter: Payer: Self-pay | Admitting: Hematology and Oncology

## 2022-01-23 NOTE — Progress Notes (Signed)
HEMATOLOGY-ONCOLOGY TELEPHONE VISIT PROGRESS NOTE  I connected with our patient on 01/26/22 at  9:00 AM EDT by telephone and verified that I am speaking with the correct person using two identifiers.  I discussed the limitations, risks, security and privacy concerns of performing an evaluation and management service by telephone and the availability of in person appointments.  I also discussed with the patient that there may be a patient responsible charge related to this service. The patient expressed understanding and agreed to proceed.   History of Present Illness: Brenda Harding is a 66 y.o. with above-mentioned history of right breast cancer treated with neoadjuvant chemotherapy, lumpectomy, radiation, and is currently on antiestrogen therapy with anastrozole. She presents to the clinic today via phone visit to discuss the results of her bone density.  Oncology History  Malignant neoplasm of upper-outer quadrant of right female breast ([place])  02/24/2016 Initial Diagnosis   Right breast biopsy 1:00, grade 2-3 IDC, ER 5%, PR 0%, Ki-67 90%, HER-2 positive ratio 7.28, right breast biopsy 1:00 1.5 cm medial to the dominant mass fibrocystic changes; ultrasound and mammogram 02/23/2016: 2.5 cm mass at 1:00 position and a small 28m irregular mass 1.5 cm medial to the larger mass, T2 N0 stage II a clinical stage   03/04/2016 Breast MRI   2.9 cm irregular enhancing mass located within the right breast at 1:00 position, several T2 bright lesions in the right lobe of the liver incompletely visualized could be hepatic cysts   03/22/2016 - 07/05/2016 Neo-Adjuvant Chemotherapy   Neo-Adj TCHP 5 cycles followed by Herceptin and Perjeta   07/07/2016 Breast MRI   Right breast cancer: No measurable enhancement, residual 1-2 mm foci; left breast partially cystic lesion at 2:30 position 2.1 cm which is new 1 indeterminate left axillary lymph node   08/09/2016 Surgery   Right lumpectomy: 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, no  residual invasive cancer, 0/4 lymph nodes negative, ER 5% week, PR negative, HER-2 positive, Ki-67 90%: Complete pathologic response ypTis ypN0   09/11/2016 - 10/09/2016 Radiation Therapy   1) Right breast: 42.5 Gy in 16 fractions  2) Right breast boost: 10 Gy in 4 fractions     01/28/2017 -  Anti-estrogen oral therapy   Anastrozole 1 mg daily (prescribed in July, and started in end of September)     REVIEW OF SYSTEMS:   Constitutional: Denies fevers, chills or abnormal weight loss All other systems were reviewed with the patient and are negative. Observations/Objective:     Assessment Plan:  Malignant neoplasm of upper-outer quadrant of right [place] breast [place] Right breast biopsy 1:00, grade 2-3 IDC, ER 5%, PR 0%, Ki-67 90%, HER-2 positive ratio 7.28, right breast biopsy 1:00 1.5 cm medial to the dominant mass fibrocystic changes; ultrasound and mammogram 02/23/2016: 2.5 cm mass at 1:00 position and a small 862mirregular mass 1.5 cm medial to the larger mass, T2 N0 stage II a clinical stage   Treatment summary: 1. Neoadjuvant chemotherapy with TCH Perjeta 6 cycles followed by Herceptin and Perjeta maintenance for 1 year 03/22/2016- 07/05/2017 2. Breast conserving surgery with sentinel lymph node study: 08/08/2016 3. Followed by adjuvant radiation therapy 09/11/16- 10/09/16 4. 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   Anastrozole toxicities: Mild fatigue 2. Occasional hot flash 3. Vaginal  dryness: Improved with vaginal creams 4. Joint stiffness: Taking turmeric which is helping   Breast cancer index: Predicts that she would benefit from  extended adjuvant therapy   Breast cancer surveillance: Mammograms 06/15/2021: Benign breast density category B Bone density 01/06/2021: T score - 3.1 (used to be -2.4): Osteoporosis  Breast exam   09/13/2021: Benign    Osteoporosis: Bone density 01/11/2022: T score -3.1: Osteoporosis: Same as it was in 2022. She will get Prolia from her PCP  Return to clinic in 1 year for follow-up   I discussed the assessment and treatment plan with the patient. The patient was provided an opportunity to ask questions and all were answered. The patient agreed with the plan and demonstrated an understanding of the instructions. The patient was advised to call back or seek an in-person evaluation if the symptoms worsen or if the condition fails to improve as anticipated.   I provided 12 minutes of non-face-to-face time during this encounter.  This includes time for charting and coordination of care   Harriette Ohara, MD  I Gardiner Coins am scribing for Dr. Lindi Adie  I have reviewed the above documentation for accuracy and completeness, and I agree with the above.

## 2022-01-26 ENCOUNTER — Inpatient Hospital Stay: Payer: Medicare Other | Attending: Hematology and Oncology | Admitting: Hematology and Oncology

## 2022-01-26 DIAGNOSIS — Z170 Estrogen receptor positive status [ER+]: Secondary | ICD-10-CM | POA: Diagnosis not present

## 2022-01-26 DIAGNOSIS — C50411 Malignant neoplasm of upper-outer quadrant of right female breast: Secondary | ICD-10-CM | POA: Diagnosis not present

## 2022-01-26 NOTE — Assessment & Plan Note (Signed)
Right breast biopsy 1:00, grade 2-3 IDC, ER 5%, PR 0%, Ki-67 90%, HER-2 positive ratio 7.28, right breast biopsy 1:00 1.5 cm medial to the dominant mass fibrocystic changes; ultrasound and mammogram 02/23/2016: 2.5 cm mass at 1:00 position and a small 46m irregular mass 1.5 cm medial to the larger mass, T2 N0 stage II a clinical stage  Treatment summary: 1. Neoadjuvant chemotherapy with TCH Perjeta 6 cycles followed by Herceptinand Perjetamaintenance for 1 year 03/22/2016- 07/05/2017 2. Breast conserving surgery with sentinel lymph node study: 08/08/2016 3. Followed by adjuvant radiation therapy 09/11/16- 10/09/16 4. Followed by antiestrogen therapystarted July 2018  Right lumpectomy 08/08/2016: 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, no residual invasive cancer, 0/4 lymph nodes negative, ER 5% weak, PR negative, HER-2 positive, Ki-67 90%: Complete pathologic response ypTis ypN0 ------------------------------------------------------------------------------- Current Treatment:Anastrozole startedSeptember2018.  Anastrozole toxicities: 1. Mild fatigue 2.Occasional hot flash 3.Vaginal dryness:Improved with vaginal creams 4.Joint stiffness: Taking turmeric which is helping  Breast cancer index: Predicts that she would benefit from extended adjuvant therapy  Breast cancer surveillance: Mammograms2/15/2023: Benign breast density category B Bone density9/11/2020: T score -3.1 (used to be -2.4): Osteoporosis Breast exam5/16/2023: Benign  Osteoporosis: Bone density 01/11/2022: T score -3.1: Osteoporosis: Same as it was in 2022.  Return to clinic in 1 year for follow-up

## 2022-02-01 ENCOUNTER — Encounter (INDEPENDENT_AMBULATORY_CARE_PROVIDER_SITE_OTHER): Payer: Self-pay

## 2022-02-01 ENCOUNTER — Other Ambulatory Visit: Payer: Self-pay | Admitting: *Deleted

## 2022-02-01 ENCOUNTER — Other Ambulatory Visit (INDEPENDENT_AMBULATORY_CARE_PROVIDER_SITE_OTHER): Payer: Self-pay

## 2022-02-01 DIAGNOSIS — K317 Polyp of stomach and duodenum: Secondary | ICD-10-CM

## 2022-02-01 DIAGNOSIS — Z86010 Personal history of colonic polyps: Secondary | ICD-10-CM

## 2022-02-01 MED ORDER — ANASTROZOLE 1 MG PO TABS: 1.0000 mg | ORAL_TABLET | Freq: Every day | ORAL | 3 refills | Status: DC

## 2022-03-07 ENCOUNTER — Telehealth (INDEPENDENT_AMBULATORY_CARE_PROVIDER_SITE_OTHER): Payer: Self-pay | Admitting: *Deleted

## 2022-03-07 NOTE — Telephone Encounter (Signed)
LMOVM to call back. Pt needs bmet prior to procedure scheduled on 03/17/22

## 2022-03-13 ENCOUNTER — Other Ambulatory Visit: Payer: Self-pay | Admitting: *Deleted

## 2022-03-13 DIAGNOSIS — K219 Gastro-esophageal reflux disease without esophagitis: Secondary | ICD-10-CM

## 2022-03-13 DIAGNOSIS — R490 Dysphonia: Secondary | ICD-10-CM

## 2022-03-13 NOTE — Telephone Encounter (Signed)
Pt informed that she needs to have lab work done before procedure on 03/17/22. Pt states she will go Wednesday 03/15/22

## 2022-03-14 ENCOUNTER — Other Ambulatory Visit (HOSPITAL_COMMUNITY)
Admission: RE | Admit: 2022-03-14 | Discharge: 2022-03-14 | Disposition: A | Discharge status: Home / Self Care | Payer: Medicare Other | Source: Ambulatory Visit | Attending: Gastroenterology | Admitting: Gastroenterology

## 2022-03-14 DIAGNOSIS — R490 Dysphonia: Secondary | ICD-10-CM | POA: Diagnosis present

## 2022-03-14 DIAGNOSIS — K219 Gastro-esophageal reflux disease without esophagitis: Secondary | ICD-10-CM | POA: Insufficient documentation

## 2022-03-14 LAB — BASIC METABOLIC PANEL
Anion gap: 6 (ref 5–15)
BUN: 26 mg/dL — ABNORMAL HIGH (ref 8–23)
CO2: 27 mmol/L (ref 22–32)
Calcium: 9.4 mg/dL (ref 8.9–10.3)
Chloride: 103 mmol/L (ref 98–111)
Creatinine, Ser: 1.5 mg/dL — ABNORMAL HIGH (ref 0.44–1.00)
GFR, Estimated: 38 mL/min — ABNORMAL LOW (ref >60)
Glucose, Bld: 95 mg/dL (ref 70–99)
Potassium: 4.3 mmol/L (ref 3.5–5.1)
Sodium: 136 mmol/L (ref 135–145)

## 2022-03-17 ENCOUNTER — Other Ambulatory Visit: Payer: Self-pay

## 2022-03-17 ENCOUNTER — Encounter (HOSPITAL_COMMUNITY)
Admission: RE | Disposition: A | Discharge status: Home / Self Care | Payer: Self-pay | Source: Home / Self Care | Attending: Gastroenterology

## 2022-03-17 ENCOUNTER — Ambulatory Visit (HOSPITAL_BASED_OUTPATIENT_CLINIC_OR_DEPARTMENT_OTHER): Payer: Medicare Other | Admitting: Anesthesiology

## 2022-03-17 ENCOUNTER — Ambulatory Visit (HOSPITAL_COMMUNITY): Payer: Medicare Other | Admitting: Anesthesiology

## 2022-03-17 ENCOUNTER — Ambulatory Visit (HOSPITAL_COMMUNITY)
Admission: RE | Admit: 2022-03-17 | Discharge: 2022-03-17 | Disposition: A | Discharge status: Home / Self Care | Payer: Medicare Other | Attending: Gastroenterology | Admitting: Gastroenterology

## 2022-03-17 ENCOUNTER — Encounter (HOSPITAL_COMMUNITY): Payer: Self-pay | Admitting: Gastroenterology

## 2022-03-17 DIAGNOSIS — Z853 Personal history of malignant neoplasm of breast: Secondary | ICD-10-CM | POA: Diagnosis not present

## 2022-03-17 DIAGNOSIS — D132 Benign neoplasm of duodenum: Secondary | ICD-10-CM | POA: Diagnosis not present

## 2022-03-17 DIAGNOSIS — K3189 Other diseases of stomach and duodenum: Secondary | ICD-10-CM | POA: Insufficient documentation

## 2022-03-17 DIAGNOSIS — Z9221 Personal history of antineoplastic chemotherapy: Secondary | ICD-10-CM | POA: Diagnosis not present

## 2022-03-17 DIAGNOSIS — K449 Diaphragmatic hernia without obstruction or gangrene: Secondary | ICD-10-CM | POA: Diagnosis not present

## 2022-03-17 DIAGNOSIS — K317 Polyp of stomach and duodenum: Secondary | ICD-10-CM

## 2022-03-17 DIAGNOSIS — K219 Gastro-esophageal reflux disease without esophagitis: Secondary | ICD-10-CM | POA: Insufficient documentation

## 2022-03-17 DIAGNOSIS — I129 Hypertensive chronic kidney disease with stage 1 through stage 4 chronic kidney disease, or unspecified chronic kidney disease: Secondary | ICD-10-CM | POA: Insufficient documentation

## 2022-03-17 DIAGNOSIS — Z79899 Other long term (current) drug therapy: Secondary | ICD-10-CM | POA: Insufficient documentation

## 2022-03-17 DIAGNOSIS — K589 Irritable bowel syndrome without diarrhea: Secondary | ICD-10-CM | POA: Diagnosis not present

## 2022-03-17 DIAGNOSIS — N183 Chronic kidney disease, stage 3 unspecified: Secondary | ICD-10-CM | POA: Insufficient documentation

## 2022-03-17 HISTORY — PX: POLYPECTOMY: SHX5525

## 2022-03-17 HISTORY — DX: Chronic kidney disease, unspecified: N18.9

## 2022-03-17 HISTORY — PX: BIOPSY: SHX5522

## 2022-03-17 HISTORY — PX: ESOPHAGOGASTRODUODENOSCOPY (EGD) WITH PROPOFOL: SHX5813

## 2022-03-17 SURGERY — ESOPHAGOGASTRODUODENOSCOPY (EGD) WITH PROPOFOL
Anesthesia: General

## 2022-03-17 MED ORDER — SIMETHICONE 40 MG/0.6ML PO SUSP: ORAL | Status: DC | PRN

## 2022-03-17 MED ORDER — PROPOFOL 500 MG/50ML IV EMUL
INTRAVENOUS | Status: DC | PRN
  Administered 2022-03-17: 180 ug/kg/min via INTRAVENOUS

## 2022-03-17 MED ORDER — LIDOCAINE HCL (PF) 2 % IJ SOLN
INTRAMUSCULAR | Status: AC
  Filled 2022-03-17: qty 5

## 2022-03-17 MED ORDER — GLUCAGON HCL RDNA (DIAGNOSTIC) 1 MG IJ SOLR
INTRAMUSCULAR | Status: AC
  Filled 2022-03-17: qty 1

## 2022-03-17 MED ORDER — PROPOFOL 10 MG/ML IV BOLUS
INTRAVENOUS | Status: DC | PRN
  Administered 2022-03-17: 80 mg via INTRAVENOUS

## 2022-03-17 MED ORDER — LACTATED RINGERS IV SOLN: INTRAVENOUS | Status: DC

## 2022-03-17 MED ORDER — STERILE WATER FOR IRRIGATION IR SOLN
Status: DC | PRN
  Administered 2022-03-17: 60 mL

## 2022-03-17 MED ORDER — LACTATED RINGERS IV SOLN: INTRAVENOUS | Status: DC | PRN

## 2022-03-17 MED ORDER — GLUCAGON HCL RDNA (DIAGNOSTIC) 1 MG IJ SOLR
INTRAMUSCULAR | Status: DC | PRN
  Administered 2022-03-17: 1 mg via INTRAVENOUS

## 2022-03-17 MED ORDER — LIDOCAINE HCL (PF) 2 % IJ SOLN
INTRAMUSCULAR | Status: DC | PRN
  Administered 2022-03-17: 25 mg via INTRADERMAL

## 2022-03-17 MED ORDER — PROPOFOL 500 MG/50ML IV EMUL
INTRAVENOUS | Status: AC
  Filled 2022-03-17: qty 50

## 2022-03-17 NOTE — Anesthesia Preprocedure Evaluation (Signed)
Anesthesia Evaluation  Patient identified by MRN, date of birth, ID band Patient awake    Reviewed: Allergy & Precautions, H&P , NPO status , Patient's Chart, lab work & pertinent test results, reviewed documented beta blocker date and time   Airway Mallampati: II  TM Distance: >3 FB Neck ROM: full    Dental no notable dental hx.    Pulmonary neg pulmonary ROS   Pulmonary exam normal breath sounds clear to auscultation       Cardiovascular Exercise Tolerance: Good hypertension, negative cardio ROS  Rhythm:regular Rate:Normal     Neuro/Psych  Headaches PSYCHIATRIC DISORDERS Anxiety        GI/Hepatic Neg liver ROS,GERD  ,,  Endo/Other  negative endocrine ROS    Renal/GU CRFRenal disease  negative genitourinary   Musculoskeletal   Abdominal   Peds  Hematology negative hematology ROS (+)   Anesthesia Other Findings   Reproductive/Obstetrics negative OB ROS                              Anesthesia Physical Anesthesia Plan  ASA: 3  Anesthesia Plan: General   Post-op Pain Management:    Induction:   PONV Risk Score and Plan:   Airway Management Planned:   Additional Equipment:   Intra-op Plan:   Post-operative Plan:   Informed Consent: I have reviewed the patients History and Physical, chart, labs and discussed the procedure including the risks, benefits and alternatives for the proposed anesthesia with the patient or authorized representative who has indicated his/her understanding and acceptance.     Dental Advisory Given  Plan Discussed with: CRNA  Anesthesia Plan Comments:          Anesthesia Quick Evaluation

## 2022-03-17 NOTE — Op Note (Addendum)
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 Estimated Blood Loss:     Estimated blood loss: none. Procedure:                Pre-Anesthesia Assessment:                           - Prior to the procedure, a History and Physical                            was performed, and patient medications, allergies                            and sensitivities were reviewed. The patient's                            tolerance of previous anesthesia was reviewed.                           - The risks and benefits of the procedure and the                            sedation options and risks were discussed with the                            patient. All questions were answered and informed                            consent was obtained.                           - ASA Grade Assessment: II - A patient with mild                            systemic disease.                           After obtaining informed consent, the endoscope was  passed under direct vision. Throughout the                            procedure, the patient's blood pressure, pulse, and                            oxygen saturations were monitored continuously. The                            [id number]-H190 ([id number]) scope was introduced through the                            mouth, and advanced to the second part of duodenum.                             The upper GI endoscopy was accomplished without                            difficulty. The patient tolerated the procedure                            well. Scope In: 8:16:56 AM Scope Out: 8:34:52 AM Total Procedure Duration: 0 hours 17 minutes 56 seconds  Findings:      A 1 cm hiatal hernia was present.      The gastroesophageal flap valve was visualized endoscopically and       classified as Hill Grade III (minimal fold, loose to endoscope, hiatal       hernia likely).      Multiple medium semi-sessile fundic gland polyps with no bleeding were       found in the gastric fundus.      Possible area of previous polypectomy with mild nodularity was found in       the second portion of the duodenum. Imaging was performed using white       light and narrow band imaging to visualize the mucosa. Biopsies were       taken with a cold forceps for histology.      A single 6 mm sessile polyp with no bleeding was found in the second       portion of the duodenum. The polyp was removed with a cold snare.       Resection and retrieval were complete. Impression:               - 1 cm hiatal hernia.                           - Gastroesophageal flap valve classified as Hill                            Grade III (minimal fold, loose to endoscope, hiatal                            hernia likely).                           - Multiple fundic gland polyps.                           -  Nodular mucosa in the second portion of the                            duodenum. Biopsied.                           - A single duodenal polyp. Resected and retrieved. Moderate Sedation:      Per Anesthesia Care Recommendation:           - Discharge patient to home (ambulatory).                           - Resume previous diet.                           - Await pathology results.                           - Repeat upper endoscopy for surveillance based on                            pathology results.                            - Continue present medications.                           - If recurrent episodes of heartburn/hoarseness or                            interested in not taking antacids, can discuss                            possiblity of Transoral Incisionless Fundoplication                            (TIF). Pamphlet provided. Procedure Code(s):        --- Professional ---                           [phone], Esophagogastroduodenoscopy, flexible,                            transoral; with removal of tumor(s), polyp(s), or                            other lesion(s) by snare technique                           43239, 63, Esophagogastroduodenoscopy, flexible,                            transoral; with biopsy, single or multiple Diagnosis Code(s):        --- Professional ---                           K44.9, Diaphragmatic hernia without obstruction or  gangrene                           K31.7, Polyp of stomach and duodenum                           K31.89, Other diseases of stomach and duodenum                           D13.2, Benign neoplasm of duodenum CPT copyright 2022 American [employer] Association. All rights reserved. The codes documented in this report are preliminary and upon coder review may  be revised to meet current compliance requirements. Maylon Peppers, MD Maylon Peppers,  03/17/2022 8:48:07 AM This report has been signed electronically. Number of Addenda: 0

## 2022-03-17 NOTE — Transfer of Care (Signed)
Immediate Anesthesia Transfer of Care Note  Patient: Brenda Harding  Procedure(s) Performed: ESOPHAGOGASTRODUODENOSCOPY (EGD) WITH PROPOFOL POLYPECTOMY BIOPSY  Patient Location: PACU  Anesthesia Type:General  Level of Consciousness: awake, alert , and oriented  Airway & Oxygen Therapy: Patient Spontanous Breathing  Post-op Assessment: Report given to RN, Post -op Vital signs reviewed and stable, Patient moving all extremities X 4, and Patient able to stick tongue midline  Post vital signs: Reviewed  Last Vitals:  Vitals Value Taken Time  BP 138/75   Temp 97.7   Pulse 89   Resp 10   SpO2 96     Last Pain:  Vitals:   03/17/22 0724  TempSrc: Oral  PainSc: 0-No pain      Patients Stated Pain Goal: 7 (32/44/01 0272)  Complications: No notable events documented.

## 2022-03-17 NOTE — Anesthesia Postprocedure Evaluation (Signed)
Anesthesia Post Note  Patient: Brenda Harding  Procedure(s) Performed: ESOPHAGOGASTRODUODENOSCOPY (EGD) WITH PROPOFOL POLYPECTOMY BIOPSY  Patient location during evaluation: Phase II Anesthesia Type: General Level of consciousness: awake and alert Pain management: pain level controlled Vital Signs Assessment: post-procedure vital signs reviewed and stable Respiratory status: spontaneous breathing, nonlabored ventilation, respiratory function stable and patient connected to nasal cannula oxygen Cardiovascular status: blood pressure returned to baseline and stable Postop Assessment: no apparent nausea or vomiting Anesthetic complications: no   No notable events documented.   Last Vitals:  Vitals:   03/17/22 0724 03/17/22 0839  BP: (!) 162/84 138/75  Pulse: 81 87  Resp: 19 19  Temp: 37 C 36.5 C  SpO2: 97% 96%    Last Pain:  Vitals:   03/17/22 0839  TempSrc: Oral  PainSc: 0-No pain                 Traves Majchrzak Clyde Canterbury

## 2022-03-17 NOTE — Discharge Instructions (Signed)
You are being discharged to home.  Resume your previous diet.  We are waiting for your pathology results.  Your physician has recommended a repeat upper endoscopy for surveillance based on pathology results.  Continue your present medications.  If recurrent episodes of heartburn/hoarseness or interested in not taking antacids, can discuss possiblity of Transoral Incisionless Fundoplication (TIF). Pamphlet provided.

## 2022-03-17 NOTE — H&P (Signed)
Brenda Harding is an 66 y.o. female.   Chief Complaint: Surveillance history of duodenal adenoma HPI: Brenda Harding is a 66 y.o. female with PMH breast cancer s/p lumpectomy and chemoradiation therapy, GERD, IBS, who presents for  history of duodenal adenoma  Patient states that during the season she has presented persistent episodes of heartburn and hoarseness despite taking Prevacid compliantly. The patient denies having any nausea, vomiting, fever, chills, hematochezia, melena, hematemesis, abdominal distention, abdominal pain, diarrhea, jaundice, pruritus or weight loss.   Past Medical History:  Diagnosis Date   Anxiety    Arthritis    Breast cancer ([place])    Bronchitis    HISTORY    Chronic kidney disease    Stage 3 chronic kidney disease   Cough productive of purulent sputum    LIGHT GREEN    Early cataracts, bilateral    GERD (gastroesophageal reflux disease)    Headache    History of wheezing    Hypertension    Malignant neoplasm of upper-outer quadrant of right female breast ([place]) 02/25/2016   Personal history of chemotherapy    Personal history of radiation therapy    Scoliosis     Past Surgical History:  Procedure Laterality Date   BACK SURGERY     scoliosis spinal surgery 1970   benign tumor removal     1978   BREAST BIOPSY     BREAST LUMPECTOMY Right    08/09/2016   BREAST LUMPECTOMY WITH RADIOACTIVE SEED AND SENTINEL LYMPH NODE BIOPSY Right 08/09/2016   Procedure: RIGHT BREAST LUMPECTOMY WITH RADIOACTIVE SEED AND SENTINEL LYMPH NODE BIOPSY;  Surgeon: [name], MD;  Location: [facility];  Service: General;  Laterality: Right;   CARPAL TUNNEL RELEASE     COLONOSCOPY WITH PROPOFOL N/A 07/19/2021   Procedure: COLONOSCOPY WITH PROPOFOL;  Surgeon: Harvel Quale, MD;  [facility];  Service: Gastroenterology;  Laterality: N/A;  915 ASA 1   ESOPHAGOGASTRODUODENOSCOPY (EGD) WITH PROPOFOL N/A 07/19/2021   Procedure: ESOPHAGOGASTRODUODENOSCOPY (EGD)  WITH PROPOFOL;  Surgeon: Harvel Quale, MD;  [facility];  Service: Gastroenterology;  Laterality: N/A;   HEMOSTASIS CLIP PLACEMENT  07/19/2021   Procedure: HEMOSTASIS CLIP PLACEMENT;  Surgeon: Harvel Quale, MD;  [facility];  Service: Gastroenterology;;   POLYPECTOMY  07/19/2021   Procedure: POLYPECTOMY;  Surgeon: Montez Morita, Quillian Quince, MD;  [facility];  Service: Gastroenterology;;   [facility] REMOVAL Left 03/08/2017   Procedure: REMOVAL PORT-A-CATH;  Surgeon: [name], MD;  Location: [place];  Service: General;  Laterality: Left;   PORTACATH PLACEMENT Left 03/21/2016   Procedure: INSERTION PORT-A-CATH;  Surgeon: [name], MD;  Location: [place];  Service: General;  Laterality: Left;   TRIGGER FINGER RELEASE      Family History  Problem Relation Age of Onset   Breast cancer Maternal Aunt    Breast cancer Cousin    Colon cancer Cousin    Social History:  reports that she has never smoked. She has never used smokeless tobacco. She reports that she does not drink alcohol and does not use drugs.  Allergies:  Allergies  Allergen Reactions   Propoxyphene Other (See Comments)    Passed out, gets dizzy   Inapsine [Droperidol]     PATIENT PREFERENCE PATIENT HAS HAD NO REACTION TO THIS Pt does not want to be given this, sister had allergy to this   Latex Itching   Levaquin [Levofloxacin] Diarrhea and Nausea And Vomiting  Medications Prior to Admission  Medication Sig Dispense Refill   ALPRAZolam (XANAX) 0.5 MG tablet Take 1 tablet (0.5 mg total) by mouth 2 (two) times daily as needed (for anxiety). 60 tablet 0   anastrozole (ARIMIDEX) 1 MG tablet Take 1 tablet (1 mg total) by mouth at bedtime. 90 tablet 3   betamethasone dipropionate (DIPROLENE) 0.05 % cream Apply 1 application topically 2 (two) times a week. Applied to vagina on Mon and Thurs     BREO ELLIPTA 200-25 MCG/INH AEPB Inhale 1 puff into the lungs daily.      Cholecalciferol (VITAMIN D3) 50 MCG (2000 UT) capsule Take 1 capsule (2,000 Units total) by mouth daily.     famotidine (PEPCID) 20 MG tablet Take 1 tablet (20 mg total) by mouth 2 (two) times daily. (Patient taking differently: Take 20 mg by mouth at bedtime.)     lansoprazole (PREVACID) 30 MG capsule Take 30 mg by mouth in the morning.     levocetirizine (XYZAL) 5 MG tablet Take 5 mg by mouth every evening.     montelukast (SINGULAIR) 10 MG tablet Take 10 mg by mouth daily.      PREMARIN vaginal cream Place 1 Applicatorful vaginally 2 (two) times a week. Tuesday & Saturday     risedronate (ACTONEL) 150 MG tablet Take 150 mg by mouth every 30 (thirty) days. with water on empty stomach, nothing by mouth or lie down for next 30 minutes.     rosuvastatin (CRESTOR) 10 MG tablet Take 1 tablet (10 mg total) by mouth daily.     spironolactone (ALDACTONE) 50 MG tablet Take 50 mg by mouth 2 (two) times daily.      Triamcinolone Acetonide (NASACORT ALLERGY 24HR NA) Place 2 sprays into the nose daily.      valsartan (DIOVAN) 160 MG tablet Take 80 mg by mouth daily.     verapamil (CALAN-SR) 240 MG CR tablet Take 240 mg by mouth at bedtime.     acetaminophen (TYLENOL) 650 MG CR tablet Take 1,300 mg by mouth every 8 (eight) hours as needed for pain.     albuterol (PROVENTIL HFA;VENTOLIN HFA) 108 (90 Base) MCG/ACT inhaler Inhale 2 puffs into the lungs 2 (two) times daily as needed for wheezing or shortness of breath.      Biotin 1 MG CAPS Take 1 capsule by mouth daily. 30 capsule    guaiFENesin (MUCINEX) 600 MG 12 hr tablet Take 600 mg by mouth 2 (two) times daily as needed for cough or to loosen phlegm.      Krill Oil 500 MG CAPS Take 500 mg by mouth daily.     minoxidil (LONITEN) 10 MG tablet Take 1 tablet (10 mg total) by mouth daily. (Patient not taking: Reported on 06/16/2021) 90 tablet 3   Turmeric 500 MG CAPS Take 500 mg by mouth daily. (Patient taking differently: Take 1,000 mg by mouth daily.)      verapamil (CALAN-SR) 180 MG CR tablet Take 2 tablets (360 mg total) by mouth daily. (Patient not taking: Reported on 06/23/2021)      No results found for this or any previous visit (from the past 48 hour(s)). No results found.  Review of Systems  All other systems reviewed and are negative.   Blood pressure (!) 162/84, pulse 81, temperature 98.6 F (37 C), temperature source Oral, resp. rate 19, height '5\' 5"'$  (1.651 m), weight 78 kg, SpO2 97 %. Physical Exam  GENERAL: The patient is AO x3, in no acute  distress. HEENT: Head is normocephalic and atraumatic. EOMI are intact. Mouth is well hydrated and without lesions. NECK: Supple. No masses LUNGS: Clear to auscultation. No presence of rhonchi/wheezing/rales. Adequate chest expansion HEART: RRR, normal s1 and s2. ABDOMEN: Soft, nontender, no guarding, no peritoneal signs, and nondistended. BS +. No masses. EXTREMITIES: Without any cyanosis, clubbing, rash, lesions or edema. NEUROLOGIC: AOx3, no focal motor deficit. SKIN: no jaundice, no rashes  Assessment/Plan Brenda Harding is a 66 y.o. female with PMH breast cancer s/p lumpectomy and chemoradiation therapy, GERD, IBS, who presents for  history of duodenal adenoma.  We will proceed with EGD.  Harvel Quale, MD 03/17/2022, 7:38 AM

## 2022-03-20 LAB — SURGICAL PATHOLOGY

## 2022-03-28 ENCOUNTER — Encounter (HOSPITAL_COMMUNITY): Payer: Self-pay | Admitting: Gastroenterology

## 2022-05-11 ENCOUNTER — Other Ambulatory Visit: Payer: Self-pay | Admitting: Hematology and Oncology

## 2022-05-11 DIAGNOSIS — Z1231 Encounter for screening mammogram for malignant neoplasm of breast: Secondary | ICD-10-CM

## 2022-06-26 ENCOUNTER — Ambulatory Visit
Admission: RE | Admit: 2022-06-26 | Discharge: 2022-06-26 | Disposition: A | Discharge status: Home / Self Care | Payer: Medicare Other | Source: Ambulatory Visit | Attending: Hematology and Oncology | Admitting: Hematology and Oncology

## 2022-06-26 DIAGNOSIS — Z1231 Encounter for screening mammogram for malignant neoplasm of breast: Secondary | ICD-10-CM

## 2022-08-04 IMAGING — MG DIGITAL DIAGNOSTIC BILAT W/ TOMO W/ CAD
6 of 9 series · 6 of 25 positions shown · non-contrast
Comparison: Previous exam(s).

CLINICAL DATA: 65-year-old female presenting for annual exam.
History of right breast cancer in 6021 status post lumpectomy. No
new problems.

EXAM:
DIGITAL DIAGNOSTIC BILATERAL MAMMOGRAM WITH TOMOSYNTHESIS AND CAD
TECHNIQUE: Bilateral digital diagnostic mammography and breast tomosynthesis
was performed. The images were evaluated with computer-aided
detection.

[R MLO]
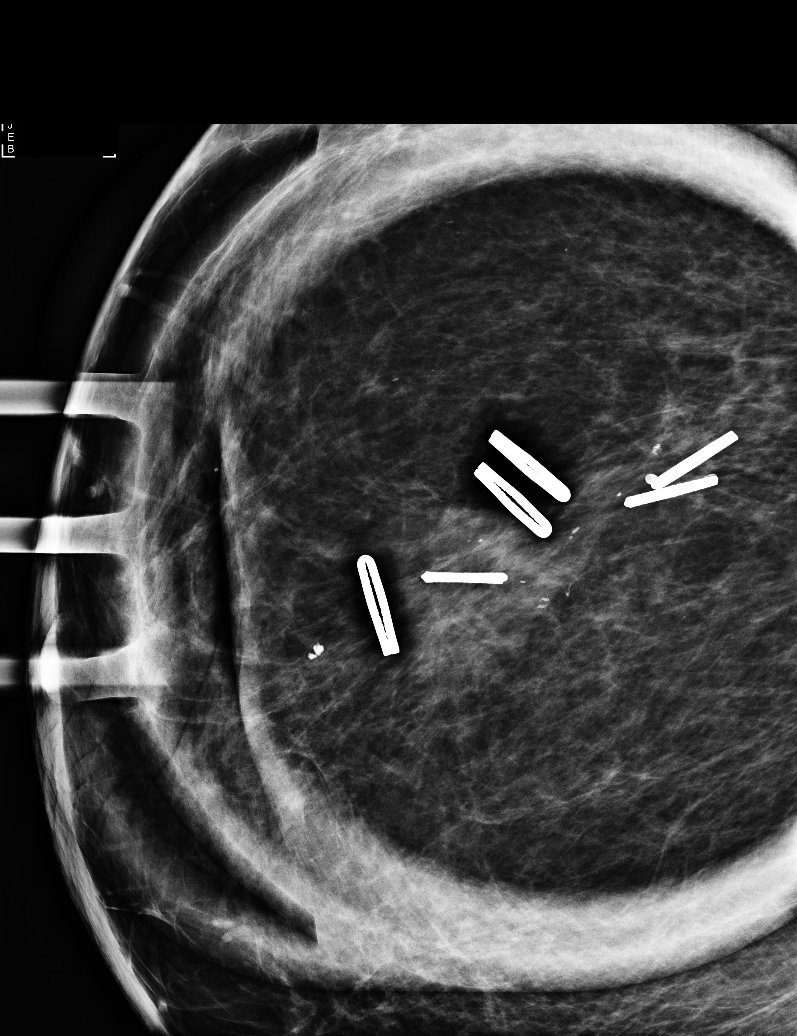

[L CC synth-2D]
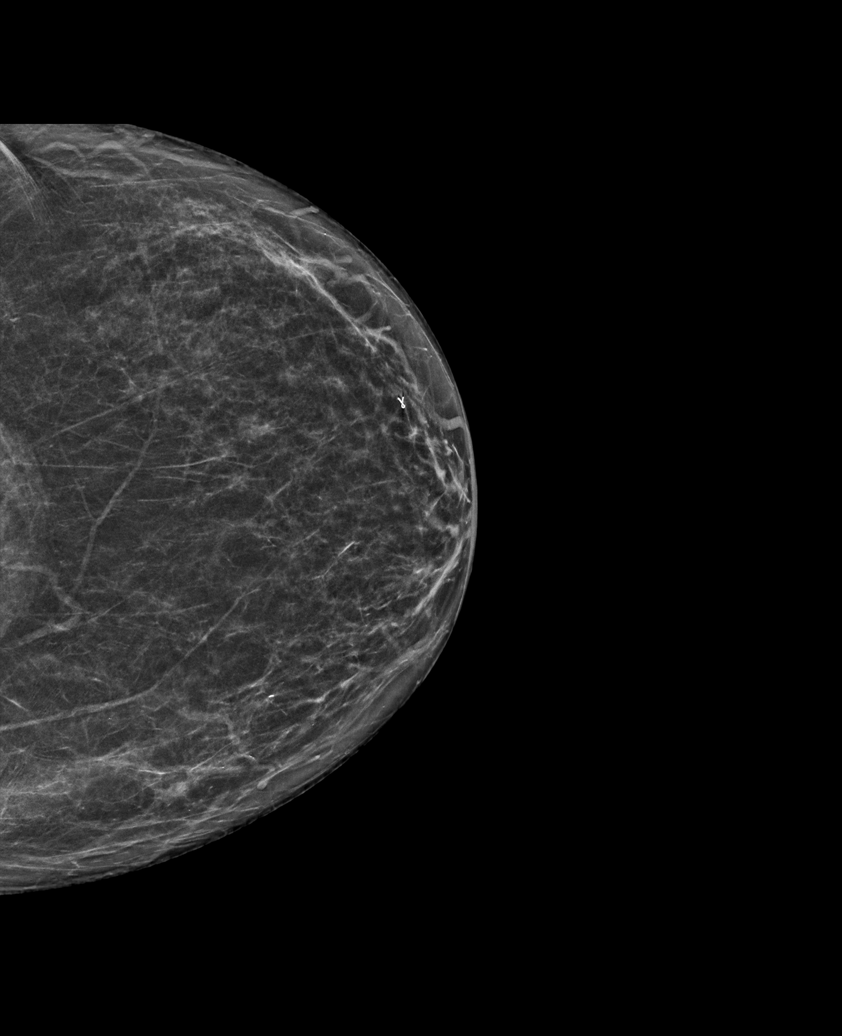

[R CC synth-2D]
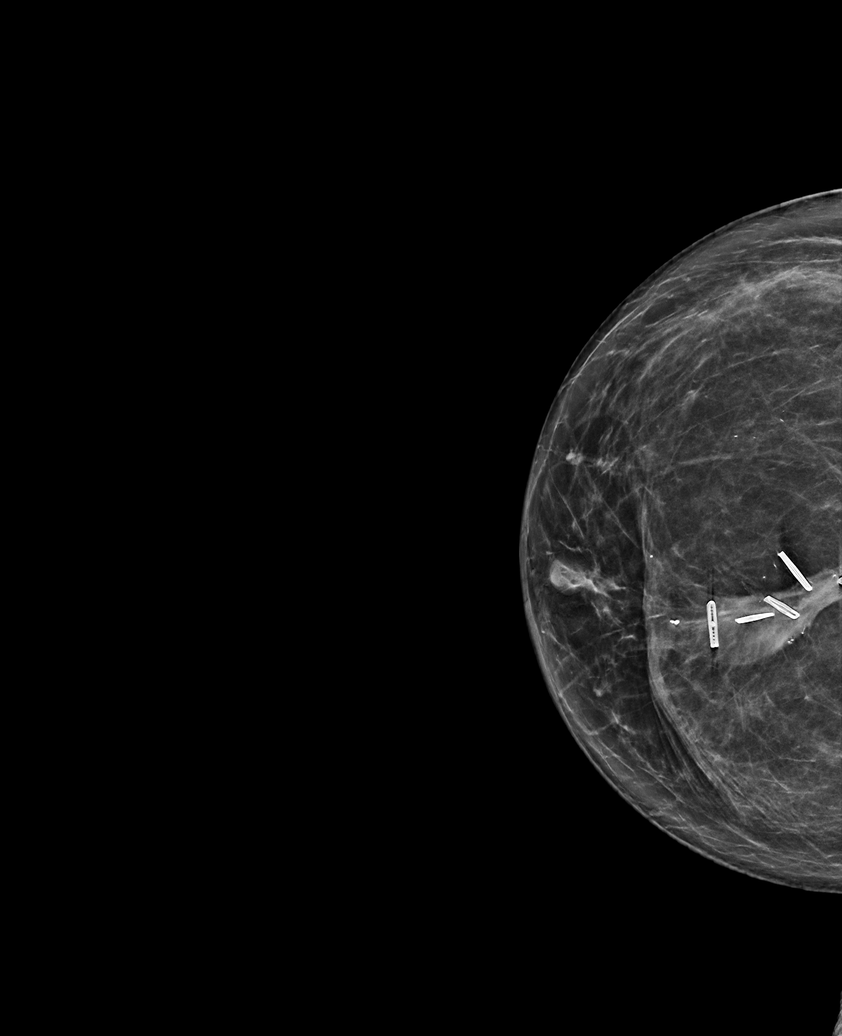

[R MLO synth-2D]
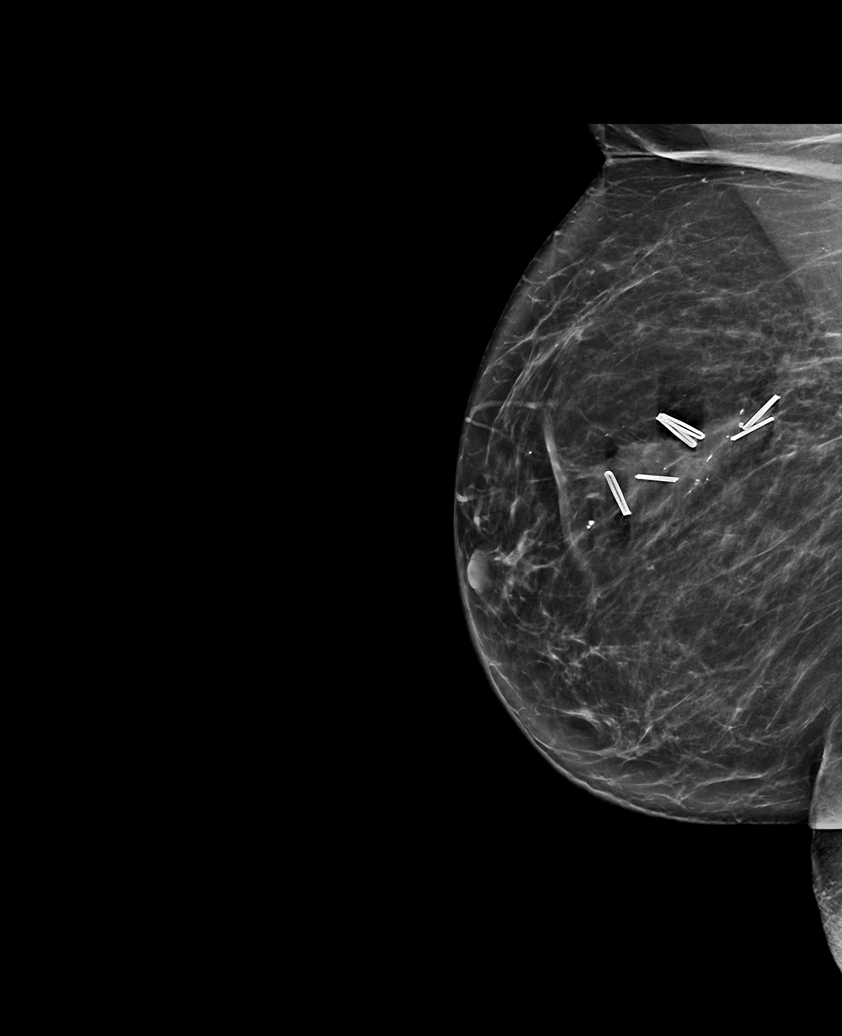

[L MLO synth-2D]
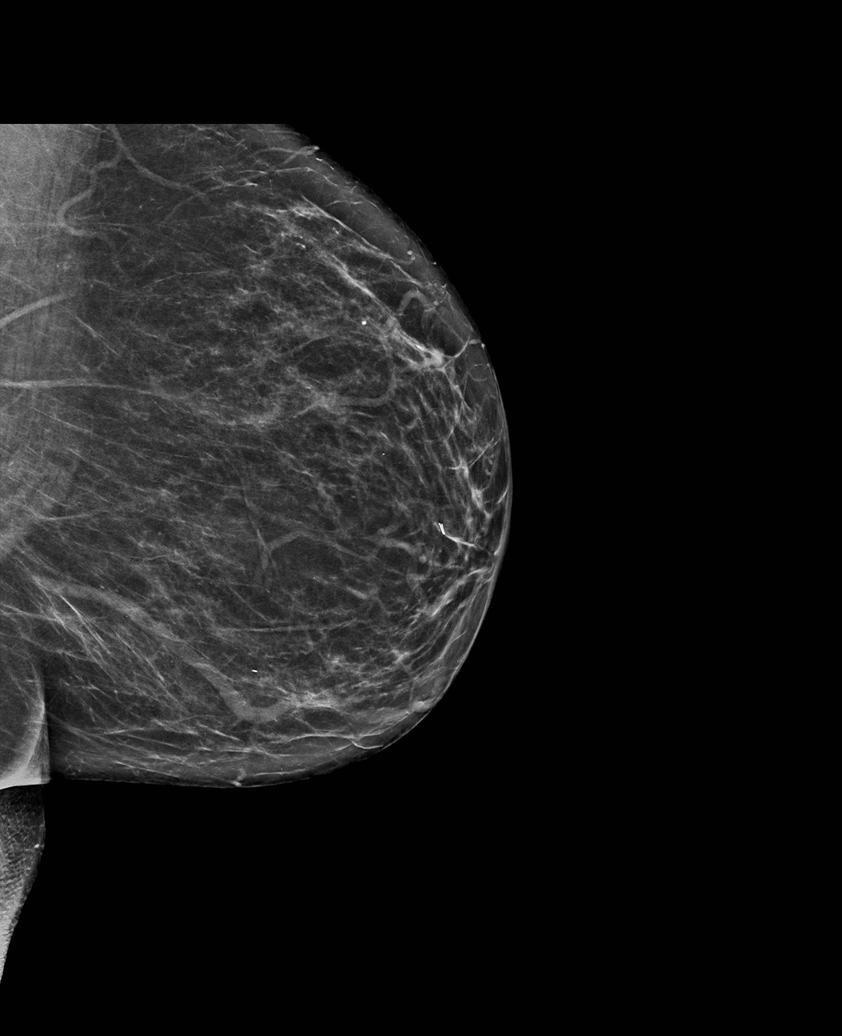

[L MLO tomo · tomo slice 35/70.0]
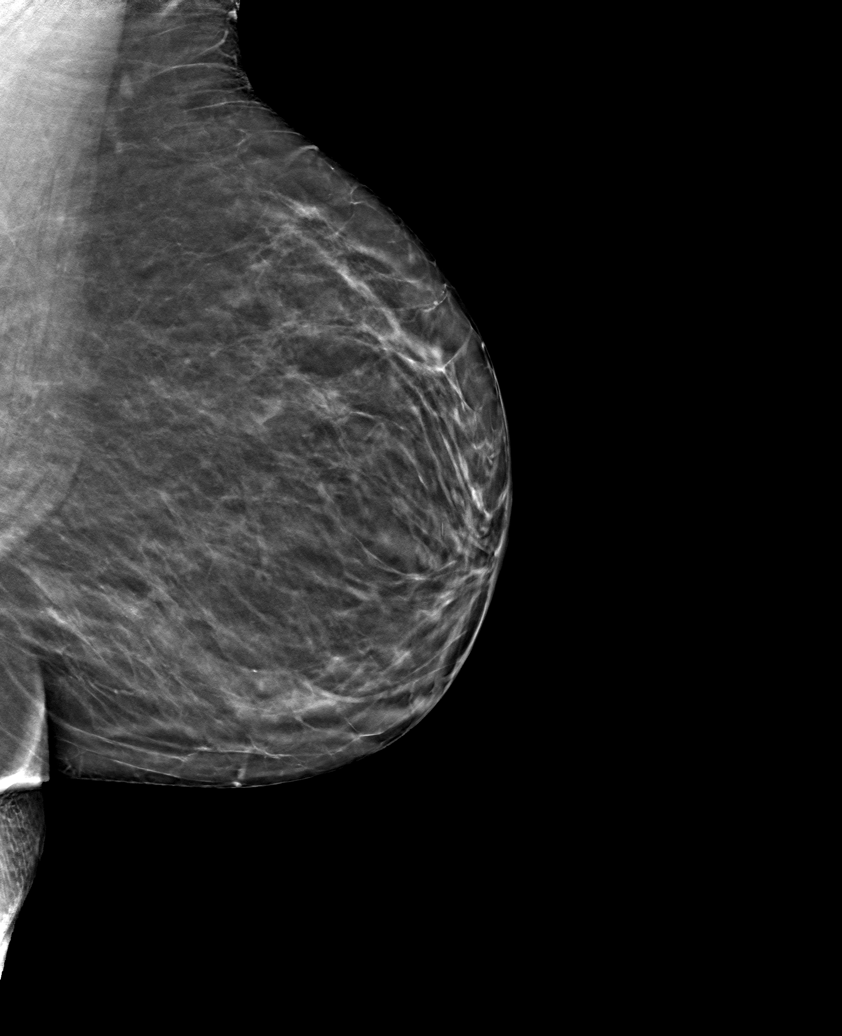

[6 of 25 positions shown; findings below may reference images not displayed]

ACR Breast Density Category b: There are scattered areas of
fibroglandular density.
FINDINGS: Right breast: A spot 2D magnification view of the lumpectomy site
was performed in addition to standard views. There are stable
postsurgical changes. No suspicious mass, distortion, or
microcalcifications are identified to suggest presence of
malignancy.

Left breast: No suspicious mass, distortion, or microcalcifications
are identified to suggest presence of malignancy.
IMPRESSION: Stable postsurgical changes in the right breast. No mammographic
evidence of malignancy bilaterally.

RECOMMENDATION:
Screening mammogram in one year.(Code:[id number]-[id number])

I have discussed the findings and recommendations with the patient.
If applicable, a reminder letter will be sent to the patient
regarding the next appointment.

BI-RADS CATEGORY  2: Benign.

## 2022-12-18 ENCOUNTER — Inpatient Hospital Stay: Payer: Medicare Other | Attending: Hematology and Oncology | Admitting: Hematology and Oncology

## 2022-12-18 VITALS — BP 152/75 | HR 93 | Temp 97.3°F | Resp 18 | Ht 65.0 in | Wt 179.5 lb

## 2022-12-18 DIAGNOSIS — Z79811 Long term (current) use of aromatase inhibitors: Secondary | ICD-10-CM | POA: Insufficient documentation

## 2022-12-18 DIAGNOSIS — M810 Age-related osteoporosis without current pathological fracture: Secondary | ICD-10-CM | POA: Insufficient documentation

## 2022-12-18 DIAGNOSIS — R232 Flushing: Secondary | ICD-10-CM | POA: Diagnosis not present

## 2022-12-18 DIAGNOSIS — Z170 Estrogen receptor positive status [ER+]: Secondary | ICD-10-CM | POA: Diagnosis not present

## 2022-12-18 DIAGNOSIS — C50411 Malignant neoplasm of upper-outer quadrant of right female breast: Secondary | ICD-10-CM | POA: Insufficient documentation

## 2022-12-18 DIAGNOSIS — Z923 Personal history of irradiation: Secondary | ICD-10-CM | POA: Insufficient documentation

## 2022-12-18 MED ORDER — ANASTROZOLE 1 MG PO TABS: 1.0000 mg | ORAL_TABLET | Freq: Every day | ORAL | 3 refills | Status: AC

## 2022-12-18 NOTE — Assessment & Plan Note (Addendum)
Right breast biopsy 1:00, grade 2-3 IDC, ER 5%, PR 0%, Ki-67 90%, HER-2 positive ratio 7.28, right breast biopsy 1:00 1.5 cm medial to the dominant mass fibrocystic changes; ultrasound and mammogram 02/23/2016: 2.5 cm mass at 1:00 position and a small 8mm irregular mass 1.5 cm medial to the larger mass, T2 N0 stage II a clinical stage   Treatment summary: 1. Neoadjuvant chemotherapy with TCH Perjeta 6 cycles followed by Herceptin and Perjeta maintenance for 1 year 03/22/2016- 07/05/2017 2. Breast conserving surgery with sentinel lymph node study: 08/08/2016 3. Followed by adjuvant radiation therapy 09/11/16- 10/09/16 4. 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   Anastrozole toxicities: Mild fatigue 2. Occasional hot flash 3. Vaginal dryness: Improved with vaginal creams 4. Joint stiffness: Taking turmeric which is helping   Breast cancer index: Predicts that she would benefit from extended adjuvant therapy   Breast cancer surveillance: Mammograms 06/28/2021: Benign breast density category B Bone density 01/11/2022: T score - 3.1 same as in 2022 (used to be -2.4): Osteoporosis: On Prolia from her PCP Breast exam 12/18/2022: Benign    Osteoporosis: Bone density 01/11/2022: T score -3.1: Osteoporosis: Same as it was in 2022.   Return to clinic in 1 year for follow-up and after that she could be seen on an as-needed basis.

## 2022-12-18 NOTE — Progress Notes (Signed)
Patient Care Team: Elise Benne, MD as PCP - General (Internal Medicine) Freddy Finner, MD (Inactive) as Consulting Physician (Obstetrics and Gynecology) Almond Lint, MD as Consulting Physician (General Surgery) Serena Croissant, MD as Consulting Physician (Hematology and Oncology) Dorothy Puffer, MD as Consulting Physician (Radiation Oncology) Axel Filler Larna Daughters, NP as Nurse Practitioner (Hematology and Oncology)  DIAGNOSIS:  Encounter Diagnosis  Name Primary?   Malignant neoplasm of upper-outer quadrant of right breast in female, estrogen receptor positive (HCC) Yes    SUMMARY OF ONCOLOGIC HISTORY: Oncology History  Malignant neoplasm of upper-outer quadrant of right female breast (HCC)  02/24/2016 Initial Diagnosis   Right breast biopsy 1:00, grade 2-3 IDC, ER 5%, PR 0%, Ki-67 90%, HER-2 positive ratio 7.28, right breast biopsy 1:00 1.5 cm medial to the dominant mass fibrocystic changes; ultrasound and mammogram 02/23/2016: 2.5 cm mass at 1:00 position and a small 8mm irregular mass 1.5 cm medial to the larger mass, T2 N0 stage II a clinical stage   03/04/2016 Breast MRI   2.9 cm irregular enhancing mass located within the right breast at 1:00 position, several T2 bright lesions in the right lobe of the liver incompletely visualized could be hepatic cysts   03/22/2016 - 07/05/2016 Neo-Adjuvant Chemotherapy   Neo-Adj TCHP 5 cycles followed by Herceptin and Perjeta   07/07/2016 Breast MRI   Right breast cancer: No measurable enhancement, residual 1-2 mm foci; left breast partially cystic lesion at 2:30 position 2.1 cm which is new 1 indeterminate left axillary lymph node   08/09/2016 Surgery   Right lumpectomy: 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 with above-mentioned history of right breast cancer treated with neoadjuvant chemotherapy, lumpectomy, radiation, and is currently on antiestrogen therapy with anastrozole. She presents to the clinic today for a follow-up. Patient reports that she is doing well. Tolerating anastrozole extremely well. She says she is starting to loose hair. She does have join stiffness could be coming from her having a really bad back.   ALLERGIES:  is allergic to propoxyphene, inapsine [droperidol], latex, and levaquin [levofloxacin].  MEDICATIONS:  Current Outpatient Medications  Medication Sig Dispense Refill   acetaminophen (TYLENOL) 650 MG CR tablet Take 1,300 mg by mouth every 8 (eight) hours as needed for pain.     albuterol (PROVENTIL HFA;VENTOLIN HFA) 108 (90 Base) MCG/ACT inhaler Inhale 2 puffs into the lungs 2 (two) times daily as needed for wheezing or shortness of breath.      ALPRAZolam (XANAX) 0.5 MG tablet Take 1 tablet (0.5 mg total) by mouth 2 (two) times daily as needed (for anxiety). 60 tablet 0   betamethasone dipropionate (DIPROLENE) 0.05 % cream Apply 1 application topically 2 (two) times a week. Applied to vagina on Mon and Thurs     Biotin 1 MG CAPS Take 1 capsule by mouth daily. 30 capsule    BREO ELLIPTA 200-25 MCG/INH AEPB Inhale 1 puff into the lungs daily.     Cholecalciferol (VITAMIN D3)  50 MCG (2000 UT) capsule Take 1 capsule (2,000 Units total) by mouth daily.     guaiFENesin (MUCINEX) 600 MG 12 hr tablet Take 600 mg by mouth 2 (two) times daily as needed for cough or to loosen phlegm.      Krill Oil 500 MG CAPS Take 500 mg by mouth daily.     lansoprazole (PREVACID) 30 MG capsule Take 30 mg by mouth in the morning.      levocetirizine (XYZAL) 5 MG tablet Take 5 mg by mouth every evening.     montelukast (SINGULAIR) 10 MG tablet Take 10 mg by mouth daily.      Multiple Vitamins-Minerals (HAIR SKIN AND NAILS FORMULA) TABS Take by mouth.     norgestrel-ethinyl estradiol (CRYSELLE-28) 0.3-30 MG-MCG tablet 1 tablet.     PREMARIN vaginal cream Place 1 Applicatorful vaginally 2 (two) times a week. Tuesday & Saturday     risedronate (ACTONEL) 150 MG tablet Take 150 mg by mouth every 30 (thirty) days. with water on empty stomach, nothing by mouth or lie down for next 30 minutes.     rosuvastatin (CRESTOR) 10 MG tablet Take 1 tablet (10 mg total) by mouth daily.     spironolactone (ALDACTONE) 50 MG tablet Take 50 mg by mouth 2 (two) times daily.      Triamcinolone Acetonide (NASACORT ALLERGY 24HR NA) Place 2 sprays into the nose daily.      Turmeric 500 MG CAPS Take 500 mg by mouth daily. (Patient taking differently: Take 1,000 mg by mouth daily.)     valsartan (DIOVAN) 160 MG tablet Take 80 mg by mouth daily.     verapamil (CALAN-SR) 180 MG CR tablet Take 2 tablets (360 mg total) by mouth daily.     verapamil (CALAN-SR) 240 MG CR tablet Take 240 mg by mouth at bedtime.     anastrozole (ARIMIDEX) 1 MG tablet Take 1 tablet (1 mg total) by mouth at bedtime. 90 tablet 3   famotidine (PEPCID) 20 MG tablet Take 1 tablet (20 mg total) by mouth 2 (two) times daily. (Patient not taking: Reported on 12/18/2022)     minoxidil (LONITEN) 10 MG tablet Take 1 tablet (10 mg total) by mouth daily. (Patient not taking: Reported on 12/18/2022) 90 tablet 3   No current facility-administered medications for this visit.   Facility-Administered Medications Ordered in Other Visits  Medication Dose Route Frequency Provider Last Rate Last Admin   sodium chloride flush (NS) 0.9 % injection 10 mL  10 mL Intravenous PRN Serena Croissant, MD   10 mL at 09/27/16 1326   sodium chloride flush (NS) 0.9 % injection 10 mL  10 mL Intracatheter PRN Serena Croissant, MD   10 mL at 12/25/16 1450    PHYSICAL EXAMINATION: ECOG PERFORMANCE STATUS: 1 - Symptomatic but completely ambulatory  Vitals:   12/18/22 1342  BP: (!) 152/75  Pulse: 93  Resp: 18  Temp: (!) 97.3 F (36.3 C)  SpO2: 99%   Filed Weights   12/18/22 1342  Weight: 179 lb 8 oz (81.4 kg)    BREAST: No palpable masses or nodules in either right or left breasts. No palpable axillary supraclavicular or infraclavicular adenopathy no breast tenderness or nipple discharge. (exam performed in the presence of a chaperone)  LABORATORY DATA:  I have reviewed the data as listed    Latest Ref Rng & Units 03/14/2022    1:09 PM 07/19/2021    9:55 AM 09/09/2018   11:14 AM  CMP  Glucose 70 - 99 mg/dL 95  86  83   BUN 8 - 23 mg/dL 26  19  22    Creatinine 0.44 - 1.00 mg/dL 1.61  0.96  0.45   Sodium 135 - 145 mmol/L 136  139  140   Potassium 3.5 - 5.1 mmol/L 4.3  4.0  4.2   Chloride 98 - 111 mmol/L 103  104  101   CO2 22 - 32 mmol/L 27  24  29    Calcium 8.9 - 10.3 mg/dL 9.4  9.1  9.6   Total Protein 6.5 - 8.1 g/dL   7.4   Total Bilirubin 0.3 - 1.2 mg/dL   0.5   Alkaline Phos 38 - 126 U/L   120   AST 15 - 41 U/L   16   ALT 0 - 44 U/L   17     Lab Results  Component Value Date   WBC 5.2 09/09/2019   HGB 12.4 09/09/2019   HCT 37.5 09/09/2019   MCV 100.3 (H) 09/09/2019   PLT 220 09/09/2019   NEUTROABS 3.0 09/09/2019    ASSESSMENT & PLAN:  Malignant neoplasm of upper-outer quadrant of right female breast (HCC) Right breast biopsy 1:00, grade 2-3 IDC, ER 5%, PR 0%, Ki-67 90%, HER-2 positive ratio 7.28, right breast biopsy 1:00 1.5 cm medial to the dominant mass fibrocystic changes; ultrasound and mammogram 02/23/2016: 2.5 cm mass at 1:00 position and a small 8mm irregular mass 1.5 cm medial to the larger mass, T2 N0 stage II a clinical stage   Treatment summary: 1. Neoadjuvant chemotherapy with TCH Perjeta 6 cycles followed by Herceptin and Perjeta maintenance for 1 year  03/22/2016- 07/05/2017 2. Breast conserving surgery with sentinel lymph node study: 08/08/2016 3. Followed by adjuvant radiation therapy 09/11/16- 10/09/16 4. 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   Anastrozole toxicities: Mild fatigue 2. Occasional hot flash 3. Vaginal dryness: Improved with vaginal creams 4. Joint stiffness: Taking turmeric which is helping 5.  Hair thinning: Periodically   Breast cancer index: Predicts that she would benefit from extended adjuvant therapy   Breast cancer surveillance: Mammograms 06/28/2021: Benign breast density category B Bone density 01/11/2022: T score - 3.1 same as in 2022 (used to be -2.4): Osteoporosis: On Prolia from her PCP Breast exam 12/18/2022: Benign    Osteoporosis: Bone density 01/11/2022: T score -3.1: Osteoporosis: Same as it was in 2022.   Return to clinic in 1 year for follow-up and after that she could be seen on an as-needed basis.    No orders of the defined types were placed in this encounter.  The patient has a good understanding of the overall plan. she agrees with it. she will call with any problems that may develop before the next visit here. Total time spent: 30 mins including face to face time and time spent for planning, charting and co-ordination of care   Tamsen Meek, MD 12/18/22    I Janan Ridge am acting as a Neurosurgeon for [employer]  I have reviewed the above documentation for accuracy and completeness, and I agree with the above.

## 2023-02-23 ENCOUNTER — Encounter (INDEPENDENT_AMBULATORY_CARE_PROVIDER_SITE_OTHER): Payer: Self-pay | Admitting: *Deleted

## 2023-06-04 ENCOUNTER — Other Ambulatory Visit: Payer: Self-pay | Admitting: Hematology and Oncology

## 2023-06-04 DIAGNOSIS — Z1231 Encounter for screening mammogram for malignant neoplasm of breast: Secondary | ICD-10-CM

## 2023-06-18 ENCOUNTER — Encounter (INDEPENDENT_AMBULATORY_CARE_PROVIDER_SITE_OTHER): Payer: Self-pay | Admitting: *Deleted

## 2023-07-03 ENCOUNTER — Ambulatory Visit
Admission: RE | Admit: 2023-07-03 | Discharge: 2023-07-03 | Disposition: A | Discharge status: Home / Self Care | Payer: Medicare Other | Source: Ambulatory Visit | Attending: Hematology and Oncology

## 2023-07-03 DIAGNOSIS — Z1231 Encounter for screening mammogram for malignant neoplasm of breast: Secondary | ICD-10-CM

## 2023-07-04 ENCOUNTER — Telehealth (INDEPENDENT_AMBULATORY_CARE_PROVIDER_SITE_OTHER): Payer: Self-pay | Admitting: Gastroenterology

## 2023-07-04 NOTE — Telephone Encounter (Signed)
 Pt is due for TCS and EGD

## 2023-07-04 NOTE — Telephone Encounter (Signed)
 Who is your primary care physician: Pradeep Pradhan/[facility]  Reasons for the colonoscopy:   Have you had a colonoscopy before?  yes  Do you have family history of colon cancer? no  Previous colonoscopy with polyps removed? yes  Do you have a history colorectal cancer?   no  Are you diabetic? If yes, Type 1 or Type 2?    no  Do you have a prosthetic or mechanical heart valve? no  Do you have a pacemaker/defibrillator?   no  Have you had endocarditis/atrial fibrillation? no  Have you had joint replacement within the last 12 months?  no  Do you tend to be constipated or have to use laxatives? no  Do you have any history of drugs or alchohol?  no  Do you use supplemental oxygen?  no  Have you had a stroke or heart attack within the last 6 months? no  Do you take weight loss medication?  no  For female patients: have you had a hysterectomy?  no                                     are you post menopausal?       yes                                            do you still have your menstrual cycle? no      Do you take any blood-thinning medications such as: (aspirin, warfarin, Plavix, Aggrenox)  no  If yes we need the name, milligram, dosage and who is prescribing doctor  Current Outpatient Medications on File Prior to Visit  Medication Sig Dispense Refill   albuterol (PROVENTIL HFA;VENTOLIN HFA) 108 (90 Base) MCG/ACT inhaler Inhale 2 puffs into the lungs 2 (two) times daily as needed for wheezing or shortness of breath.      ALPRAZolam (XANAX) 0.5 MG tablet Take 1 tablet (0.5 mg total) by mouth 2 (two) times daily as needed (for anxiety). 60 tablet 0   anastrozole (ARIMIDEX) 1 MG tablet Take 1 tablet (1 mg total) by mouth at bedtime. 90 tablet 3   betamethasone dipropionate (DIPROLENE) 0.05 % cream Apply 1 application topically 2 (two) times a week. Applied to vagina on Mon and Thurs     BREO ELLIPTA 200-25 MCG/INH AEPB Inhale 1 puff into the lungs daily.      acetaminophen (TYLENOL) 650 MG CR tablet Take 1,300 mg by mouth every 8 (eight) hours as needed for pain. (Patient not taking: Reported on 07/04/2023)     Biotin 1 MG CAPS Take 1 capsule by mouth daily. (Patient not taking: Reported on 07/04/2023) 30 capsule    Cholecalciferol (VITAMIN D3) 50 MCG (2000 UT) capsule Take 1 capsule (2,000 Units total) by mouth daily. (Patient not taking: Reported on 07/04/2023)     famotidine (PEPCID) 20 MG tablet Take 1 tablet (20 mg total) by mouth 2 (two) times daily. (Patient not taking: Reported on 12/18/2022)     guaiFENesin (MUCINEX) 600 MG 12 hr tablet Take 600 mg by mouth 2 (two) times daily as needed for cough or to loosen phlegm.  (Patient not taking: Reported on 07/04/2023)     Krill Oil 500 MG CAPS Take 500 mg by mouth daily. (Patient not taking: Reported on 07/04/2023)  lansoprazole (PREVACID) 30 MG capsule Take 30 mg by mouth in the morning.     levocetirizine (XYZAL) 5 MG tablet Take 5 mg by mouth every evening.     minoxidil (LONITEN) 10 MG tablet Take 1 tablet (10 mg total) by mouth daily. (Patient not taking: Reported on 12/18/2022) 90 tablet 3   montelukast (SINGULAIR) 10 MG tablet Take 10 mg by mouth daily.      Multiple Vitamins-Minerals (HAIR SKIN AND NAILS FORMULA) TABS Take by mouth. (Patient not taking: Reported on 07/04/2023)     norgestrel-ethinyl estradiol (CRYSELLE-28) 0.3-30 MG-MCG tablet 1 tablet. (Patient not taking: Reported on 07/04/2023)     PREMARIN vaginal cream Place 1 Applicatorful vaginally 2 (two) times a week. Tuesday & Saturday     risedronate (ACTONEL) 150 MG tablet Take 150 mg by mouth every 30 (thirty) days. with water on empty stomach, nothing by mouth or lie down for next 30 minutes.     rosuvastatin (CRESTOR) 10 MG tablet Take 1 tablet (10 mg total) by mouth daily.     spironolactone (ALDACTONE) 50 MG tablet Take 50 mg by mouth 2 (two) times daily.      Triamcinolone Acetonide (NASACORT ALLERGY 24HR NA) Place 2 sprays into the nose  daily.      Turmeric 500 MG CAPS Take 500 mg by mouth daily. (Patient taking differently: Take 1,000 mg by mouth daily.)     valsartan (DIOVAN) 160 MG tablet Take 80 mg by mouth daily.     verapamil (CALAN-SR) 180 MG CR tablet Take 2 tablets (360 mg total) by mouth daily. (Patient not taking: Reported on 07/04/2023)     verapamil (CALAN-SR) 240 MG CR tablet Take 240 mg by mouth at bedtime.     Current Facility-Administered Medications on File Prior to Visit  Medication Dose Route Frequency Provider Last Rate Last Admin   sodium chloride flush (NS) 0.9 % injection 10 mL  10 mL Intravenous PRN Serena Croissant, MD   10 mL at 09/27/16 1326   sodium chloride flush (NS) 0.9 % injection 10 mL  10 mL Intracatheter PRN Serena Croissant, MD   10 mL at 12/25/16 1450    Allergies  Allergen Reactions   Propoxyphene Other (See Comments)    Passed out, gets dizzy   Inapsine [Droperidol]     PATIENT PREFERENCE PATIENT HAS HAD NO REACTION TO THIS Pt does not want to be given this, sister had allergy to this   Latex Itching   Levaquin [Levofloxacin] Diarrhea and Nausea And Vomiting     Pharmacy:   Primary Insurance Name: Medicare/[employer]  Best number where you can be reached: [phone]

## 2023-07-04 NOTE — Telephone Encounter (Signed)
 Room 1 Thanks

## 2023-07-11 MED ORDER — PEG 3350-KCL-NA BICARB-NACL 420 G PO SOLR: 4000.0000 mL | Freq: Once | ORAL | 0 refills | Status: AC

## 2023-07-11 NOTE — Addendum Note (Signed)
 Addended by: Marlowe Shores on: 07/11/2023 04:42 PM   Modules accepted: Orders

## 2023-07-11 NOTE — Telephone Encounter (Signed)
 Pt contacted and scheduled. Prep sent to pharmacy. Instructions mailed to patient.

## 2023-07-12 NOTE — Telephone Encounter (Signed)
 Questionnaire from recall, no referral needed

## 2023-07-13 ENCOUNTER — Telehealth (INDEPENDENT_AMBULATORY_CARE_PROVIDER_SITE_OTHER): Payer: Self-pay | Admitting: Gastroenterology

## 2023-07-13 NOTE — Telephone Encounter (Signed)
 Pt left voicemail requesting sutabs be sent to [facility] in [place]. Returned call to pt but had to leave message to return call.  (Dr.Castaneda does not advise sutab prep)

## 2023-07-17 NOTE — Telephone Encounter (Signed)
 Pt returned call. Pt advised that provider does not recommend tablet prep. Pt states "he will not get a good clean out if I drink the liquid because I'm not able to drink all that liquid". I advised pt I could send in a smaller volume prep but pt states she would do better with tablets. Please advise. Thank you

## 2023-07-21 NOTE — Telephone Encounter (Signed)
 Ok to proceed with Sutab preparation, as long as the patient understands that if the prep is inadequate, a repeat colonoscopy would need to be performed in a much shorter timeframe.

## 2023-07-23 NOTE — Telephone Encounter (Signed)
 Pt contacted. Sample of tablets will be picked up by husband today.

## 2023-08-01 ENCOUNTER — Ambulatory Visit (HOSPITAL_COMMUNITY): Admitting: Anesthesiology

## 2023-08-01 ENCOUNTER — Telehealth (INDEPENDENT_AMBULATORY_CARE_PROVIDER_SITE_OTHER): Payer: Self-pay | Admitting: *Deleted

## 2023-08-01 ENCOUNTER — Ambulatory Visit (HOSPITAL_COMMUNITY)
Admission: RE | Admit: 2023-08-01 | Discharge: 2023-08-01 | Disposition: A | Discharge status: Home / Self Care | Attending: Gastroenterology | Admitting: Gastroenterology

## 2023-08-01 ENCOUNTER — Encounter (HOSPITAL_COMMUNITY)
Admission: RE | Disposition: A | Discharge status: Home / Self Care | Payer: Self-pay | Source: Home / Self Care | Attending: Gastroenterology

## 2023-08-01 ENCOUNTER — Other Ambulatory Visit: Payer: Self-pay

## 2023-08-01 ENCOUNTER — Encounter (HOSPITAL_COMMUNITY): Payer: Self-pay | Admitting: Gastroenterology

## 2023-08-01 DIAGNOSIS — R519 Headache, unspecified: Secondary | ICD-10-CM | POA: Diagnosis not present

## 2023-08-01 DIAGNOSIS — D120 Benign neoplasm of cecum: Secondary | ICD-10-CM

## 2023-08-01 DIAGNOSIS — Z9221 Personal history of antineoplastic chemotherapy: Secondary | ICD-10-CM | POA: Insufficient documentation

## 2023-08-01 DIAGNOSIS — K2289 Other specified disease of esophagus: Secondary | ICD-10-CM | POA: Diagnosis not present

## 2023-08-01 DIAGNOSIS — K5730 Diverticulosis of large intestine without perforation or abscess without bleeding: Secondary | ICD-10-CM | POA: Diagnosis not present

## 2023-08-01 DIAGNOSIS — D122 Benign neoplasm of ascending colon: Secondary | ICD-10-CM | POA: Diagnosis not present

## 2023-08-01 DIAGNOSIS — D132 Benign neoplasm of duodenum: Secondary | ICD-10-CM | POA: Diagnosis not present

## 2023-08-01 DIAGNOSIS — Z79899 Other long term (current) drug therapy: Secondary | ICD-10-CM | POA: Diagnosis not present

## 2023-08-01 DIAGNOSIS — B3781 Candidal esophagitis: Secondary | ICD-10-CM | POA: Diagnosis not present

## 2023-08-01 DIAGNOSIS — K319 Disease of stomach and duodenum, unspecified: Secondary | ICD-10-CM | POA: Diagnosis not present

## 2023-08-01 DIAGNOSIS — I10 Essential (primary) hypertension: Secondary | ICD-10-CM | POA: Insufficient documentation

## 2023-08-01 DIAGNOSIS — K648 Other hemorrhoids: Secondary | ICD-10-CM | POA: Diagnosis not present

## 2023-08-01 DIAGNOSIS — K589 Irritable bowel syndrome without diarrhea: Secondary | ICD-10-CM | POA: Diagnosis not present

## 2023-08-01 DIAGNOSIS — I129 Hypertensive chronic kidney disease with stage 1 through stage 4 chronic kidney disease, or unspecified chronic kidney disease: Secondary | ICD-10-CM | POA: Insufficient documentation

## 2023-08-01 DIAGNOSIS — Z853 Personal history of malignant neoplasm of breast: Secondary | ICD-10-CM | POA: Insufficient documentation

## 2023-08-01 DIAGNOSIS — M1990 Unspecified osteoarthritis, unspecified site: Secondary | ICD-10-CM | POA: Insufficient documentation

## 2023-08-01 DIAGNOSIS — K3189 Other diseases of stomach and duodenum: Secondary | ICD-10-CM | POA: Diagnosis not present

## 2023-08-01 DIAGNOSIS — Z7951 Long term (current) use of inhaled steroids: Secondary | ICD-10-CM | POA: Insufficient documentation

## 2023-08-01 DIAGNOSIS — D123 Benign neoplasm of transverse colon: Secondary | ICD-10-CM | POA: Insufficient documentation

## 2023-08-01 DIAGNOSIS — N183 Chronic kidney disease, stage 3 unspecified: Secondary | ICD-10-CM | POA: Insufficient documentation

## 2023-08-01 DIAGNOSIS — Z1211 Encounter for screening for malignant neoplasm of colon: Secondary | ICD-10-CM | POA: Insufficient documentation

## 2023-08-01 DIAGNOSIS — F419 Anxiety disorder, unspecified: Secondary | ICD-10-CM | POA: Insufficient documentation

## 2023-08-01 DIAGNOSIS — K219 Gastro-esophageal reflux disease without esophagitis: Secondary | ICD-10-CM | POA: Diagnosis not present

## 2023-08-01 DIAGNOSIS — Z923 Personal history of irradiation: Secondary | ICD-10-CM | POA: Insufficient documentation

## 2023-08-01 HISTORY — PX: ESOPHAGOGASTRODUODENOSCOPY: SHX5428

## 2023-08-01 HISTORY — PX: POLYPECTOMY: SHX5525

## 2023-08-01 HISTORY — PX: COLONOSCOPY: SHX5424

## 2023-08-01 SURGERY — COLONOSCOPY
Anesthesia: General

## 2023-08-01 MED ORDER — PROPOFOL 500 MG/50ML IV EMUL
INTRAVENOUS | Status: DC | PRN
  Administered 2023-08-01: 200 ug/kg/min via INTRAVENOUS

## 2023-08-01 MED ORDER — SODIUM CHLORIDE 0.9% FLUSH: 3.0000 mL | INTRAVENOUS | Status: DC | PRN

## 2023-08-01 MED ORDER — LIDOCAINE HCL (CARDIAC) PF 100 MG/5ML IV SOSY
PREFILLED_SYRINGE | INTRAVENOUS | Status: DC | PRN
  Administered 2023-08-01: 100 mg via INTRAVENOUS

## 2023-08-01 MED ORDER — SODIUM CHLORIDE 0.9% FLUSH
3.0000 mL | Freq: Two times a day (BID) | INTRAVENOUS | Status: DC
Start: 2023-08-01 — End: 2023-08-01

## 2023-08-01 MED ORDER — LACTATED RINGERS IV SOLN: INTRAVENOUS | Status: DC | PRN

## 2023-08-01 MED ORDER — GLUCAGON HCL RDNA (DIAGNOSTIC) 1 MG IJ SOLR
INTRAMUSCULAR | Status: DC | PRN
Start: 2023-08-01 — End: 2023-08-01
  Administered 2023-08-01: .25 mg via INTRAVENOUS

## 2023-08-01 MED ORDER — PROPOFOL 10 MG/ML IV BOLUS
INTRAVENOUS | Status: DC | PRN
  Administered 2023-08-01: 50 mg via INTRAVENOUS
  Administered 2023-08-01: 100 mg via INTRAVENOUS

## 2023-08-01 MED ORDER — SODIUM CHLORIDE 0.9% FLUSH: 3.0000 mL | Freq: Two times a day (BID) | INTRAVENOUS | Status: DC

## 2023-08-01 NOTE — Telephone Encounter (Signed)
-----   Message from Katrinka Blazing Mayorga sent at 08/01/2023 10:58 AM EDT ----- Casandra Doffing, can you please refer the patient to genetic counseling. Dx colonic polyposis  Thanks,   Katrinka Blazing, MD Gastroenterology and Hepatology [facility]

## 2023-08-01 NOTE — Telephone Encounter (Signed)
 Referral sent, they will contact patient with apt

## 2023-08-01 NOTE — H&P (Signed)
 Brenda Harding is an 68 y.o. female.   Chief Complaint: History of colonic and duodenal polyps HPI: Brenda Harding is a 68 y.o. female with PMH breast cancer s/p lumpectomy and chemoradiation therapy, GERD, IBS, who presents for  history of duodenal adenoma and colonic polyps.  The patient denies having any nausea, vomiting, fever, chills, hematochezia, melena, hematemesis, abdominal distention, abdominal pain, diarrhea, jaundice, pruritus or weight loss.  1 cousin had colon cancer in the past.  Past Medical History:  Diagnosis Date   Anxiety    Arthritis    Breast cancer (HCC)    Bronchitis    HISTORY    Chronic kidney disease    Stage 3 chronic kidney disease   Cough productive of purulent sputum    LIGHT GREEN    Early cataracts, bilateral    GERD (gastroesophageal reflux disease)    Headache    History of wheezing    Hypertension    Malignant neoplasm of upper-outer quadrant of right female breast (HCC) 02/25/2016   Personal history of chemotherapy    Personal history of radiation therapy    Scoliosis     Past Surgical History:  Procedure Laterality Date   BACK SURGERY     scoliosis spinal surgery 1970   benign tumor removal     1978   BIOPSY  03/17/2022   Procedure: BIOPSY;  Surgeon: Dolores Frame, MD;  [facility]: [facility];  Service: Gastroenterology;;   BREAST BIOPSY     BREAST LUMPECTOMY Right    08/09/2016   BREAST LUMPECTOMY WITH RADIOACTIVE SEED AND SENTINEL LYMPH NODE BIOPSY Right 08/09/2016   Procedure: RIGHT BREAST LUMPECTOMY WITH RADIOACTIVE SEED AND SENTINEL LYMPH NODE BIOPSY;  Surgeon: Almond Lint, MD;  Location: [facility];  Service: General;  Laterality: Right;   CARPAL TUNNEL RELEASE     COLONOSCOPY WITH PROPOFOL N/A 07/19/2021   Procedure: COLONOSCOPY WITH PROPOFOL;  Surgeon: Dolores Frame, MD;  Location: [facility];  Service: Gastroenterology;  Laterality: N/A;  915 ASA 1   ESOPHAGOGASTRODUODENOSCOPY (EGD) WITH PROPOFOL N/A  07/19/2021   Procedure: ESOPHAGOGASTRODUODENOSCOPY (EGD) WITH PROPOFOL;  Surgeon: Dolores Frame, MD;  [facility]: [facility];  Service: Gastroenterology;  Laterality: N/A;   ESOPHAGOGASTRODUODENOSCOPY (EGD) WITH PROPOFOL N/A 03/17/2022   Procedure: ESOPHAGOGASTRODUODENOSCOPY (EGD) WITH PROPOFOL;  Surgeon: Dolores Frame, MD;  [facility]: [facility];  Service: Gastroenterology;  Laterality: N/A;  730 ASA 2   HEMOSTASIS CLIP PLACEMENT  07/19/2021   Procedure: HEMOSTASIS CLIP PLACEMENT;  Surgeon: Dolores Frame, MD;  [facility]: [facility];  Service: Gastroenterology;;   POLYPECTOMY  07/19/2021   Procedure: POLYPECTOMY;  Surgeon: Dolores Frame, MD;  [facility]: [facility];  Service: Gastroenterology;;   POLYPECTOMY  03/17/2022   Procedure: POLYPECTOMY;  Surgeon: Dolores Frame, MD;  Location: [facility];  Service: Gastroenterology;;   [facility] REMOVAL Left 03/08/2017   Procedure: REMOVAL PORT-A-CATH;  Surgeon: Almond Lint, MD;  Location: [facility] OR;  Service: General;  Laterality: Left;   PORTACATH PLACEMENT Left 03/21/2016   Procedure: INSERTION PORT-A-CATH;  Surgeon: Almond Lint, MD;  Location: [facility];  Service: General;  Laterality: Left;   TRIGGER FINGER RELEASE      Family History  Problem Relation Age of Onset   Breast cancer Maternal Aunt    Breast cancer Cousin    Colon cancer Cousin    Social History:  reports that she has never smoked. She has never used smokeless tobacco. She reports that  she does not drink alcohol and does not use drugs.  Allergies:  Allergies  Allergen Reactions   Propoxyphene Other (See Comments)    Passed out, gets dizzy   Inapsine [Droperidol]     PATIENT PREFERENCE PATIENT HAS HAD NO REACTION TO THIS Pt does not want to be given this, sister had allergy to this   Latex Itching   Levaquin [Levofloxacin] Diarrhea and Nausea And Vomiting    Medications Prior to Admission  Medication Sig  Dispense Refill   acetaminophen (TYLENOL) 650 MG CR tablet Take 1,300 mg by mouth every 8 (eight) hours as needed for pain.     albuterol (PROVENTIL HFA;VENTOLIN HFA) 108 (90 Base) MCG/ACT inhaler Inhale 2 puffs into the lungs 2 (two) times daily as needed for wheezing or shortness of breath.      ALPRAZolam (XANAX) 0.5 MG tablet Take 1 tablet (0.5 mg total) by mouth 2 (two) times daily as needed (for anxiety). 60 tablet 0   anastrozole (ARIMIDEX) 1 MG tablet Take 1 tablet (1 mg total) by mouth at bedtime. 90 tablet 3   betamethasone dipropionate (DIPROLENE) 0.05 % cream Apply 1 application topically 2 (two) times a week. Applied to vagina on Mon and Thurs     Biotin 1 MG CAPS Take 1 capsule by mouth daily. 30 capsule    BREO ELLIPTA 200-25 MCG/INH AEPB Inhale 1 puff into the lungs daily.     Cholecalciferol (VITAMIN D3) 50 MCG (2000 UT) capsule Take 1 capsule (2,000 Units total) by mouth daily.     famotidine (PEPCID) 20 MG tablet Take 1 tablet (20 mg total) by mouth 2 (two) times daily.     guaiFENesin (MUCINEX) 600 MG 12 hr tablet Take 600 mg by mouth 2 (two) times daily as needed for cough or to loosen phlegm.     Krill Oil 500 MG CAPS Take 500 mg by mouth daily.     lansoprazole (PREVACID) 30 MG capsule Take 30 mg by mouth in the morning.     levocetirizine (XYZAL) 5 MG tablet Take 5 mg by mouth every evening.     minoxidil (LONITEN) 10 MG tablet Take 1 tablet (10 mg total) by mouth daily. 90 tablet 3   montelukast (SINGULAIR) 10 MG tablet Take 10 mg by mouth daily.      Multiple Vitamins-Minerals (HAIR SKIN AND NAILS FORMULA) TABS Take by mouth.     norgestrel-ethinyl estradiol (CRYSELLE-28) 0.3-30 MG-MCG tablet 1 tablet.     PREMARIN vaginal cream Place 1 Applicatorful vaginally 2 (two) times a week. Tuesday & Saturday     risedronate (ACTONEL) 150 MG tablet Take 150 mg by mouth every 30 (thirty) days. with water on empty stomach, nothing by mouth or lie down for next 30 minutes.      rosuvastatin (CRESTOR) 10 MG tablet Take 1 tablet (10 mg total) by mouth daily.     spironolactone (ALDACTONE) 50 MG tablet Take 50 mg by mouth 2 (two) times daily.      Triamcinolone Acetonide (NASACORT ALLERGY 24HR NA) Place 2 sprays into the nose daily.      Turmeric 500 MG CAPS Take 500 mg by mouth daily. (Patient taking differently: Take 1,000 mg by mouth daily.)     valsartan (DIOVAN) 160 MG tablet Take 80 mg by mouth daily.     verapamil (CALAN-SR) 180 MG CR tablet Take 2 tablets (360 mg total) by mouth daily.     verapamil (CALAN-SR) 240 MG CR tablet Take 240 mg  by mouth at bedtime.      No results found for this or any previous visit (from the past 48 hours). No results found.  Review of Systems  All other systems reviewed and are negative.   Pulse 74, temperature 98.6 F (37 C), temperature source Oral, resp. rate (!) 21, height 5\' 5"  (1.651 m), weight 81.2 kg, SpO2 99%. Physical Exam  GENERAL: The patient is AO x3, in no acute distress. HEENT: Head is normocephalic and atraumatic. EOMI are intact. Mouth is well hydrated and without lesions. NECK: Supple. No masses LUNGS: Clear to auscultation. No presence of rhonchi/wheezing/rales. Adequate chest expansion HEART: RRR, normal s1 and s2. ABDOMEN: Soft, nontender, no guarding, no peritoneal signs, and nondistended. BS +. No masses. EXTREMITIES: Without any cyanosis, clubbing, rash, lesions or edema. NEUROLOGIC: AOx3, no focal motor deficit. SKIN: no jaundice, no rashes  Assessment/Plan Brenda Harding is a 68 y.o. female with PMH breast cancer s/p lumpectomy and chemoradiation therapy, GERD, IBS, who presents for  history of duodenal adenoma and colonic polyps.  Will proceed with EGD and colonoscopy.  Dolores Frame, MD 08/01/2023, 9:38 AM

## 2023-08-01 NOTE — Discharge Instructions (Addendum)
 You are being discharged to home.  Resume your previous diet.  We are waiting for your pathology results.  Your physician has recommended a repeat colonoscopy in one year because the bowel preparation was suboptimal.  Referral to genetic counselor. Rinse and spit mouth after using oral inhalers.

## 2023-08-01 NOTE — Anesthesia Postprocedure Evaluation (Signed)
 Anesthesia Post Note  Patient: Brenda Harding  Procedure(s) Performed: COLONOSCOPY EGD (ESOPHAGOGASTRODUODENOSCOPY) POLYPECTOMY  Patient location during evaluation: PACU Anesthesia Type: General Level of consciousness: awake and alert Pain management: pain level controlled Vital Signs Assessment: post-procedure vital signs reviewed and stable Respiratory status: spontaneous breathing, nonlabored ventilation, respiratory function stable and patient connected to nasal cannula oxygen Cardiovascular status: blood pressure returned to baseline and stable Postop Assessment: no apparent nausea or vomiting Anesthetic complications: no   There were no known notable events for this encounter.   Last Vitals:  Vitals:   08/01/23 0845 08/01/23 1044  BP:  (!) 91/57  Pulse: 74 85  Resp: (!) 21 20  Temp: 37 C (!) 36.4 C  SpO2: 99% 97%    Last Pain:  Vitals:   08/01/23 1044  TempSrc: Axillary  PainSc:                  Gaetano Hawthorne

## 2023-08-01 NOTE — Op Note (Signed)
 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 Estimated Blood Loss:     Estimated blood loss: none. Procedure:                Pre-Anesthesia Assessment:                           - Prior to the procedure, a History and Physical                            was performed, and patient medications, allergies                            and sensitivities were reviewed. The patient's                            tolerance of previous anesthesia was reviewed.                           - The risks and benefits of the procedure and the                            sedation options and risks were discussed with the                            patient. All questions were answered and informed                            consent was obtained.                           - ASA Grade Assessment: II - A patient with mild                            systemic disease.                           After obtaining informed consent, the endoscope was                            passed under direct vision. Throughout the  procedure, the patient's blood pressure, pulse, and                            oxygen saturations were monitored continuously. The                            [id number]-[id number] ([id number]) scope was introduced through the                            mouth, and advanced to the second part of duodenum.                            The upper GI endoscopy was  accomplished without                            difficulty. The patient tolerated the procedure                            well. Scope In: 9:51:35 AM Scope Out: 10:02:17 AM Total Procedure Duration: 0 hours 10 minutes 42 seconds  Findings:      White nummular lesions were noted in the entire esophagus. Biopsies were       taken with a cold forceps for histology.      Striped mildly erythematous mucosa without bleeding was found in the       gastric antrum. Biopsies were taken with a cold forceps for Helicobacter       pylori testing.      The examined duodenum was normal. Impression:               - White nummular lesions in esophageal mucosa.                            Biopsied.                           - Erythematous mucosa in the antrum. Biopsied.                           - Normal examined duodenum. Moderate Sedation:      Per Anesthesia Care Recommendation:           - Discharge patient to home (ambulatory).                           - Resume previous diet.                           - Await pathology results. Procedure Code(s):        --- Professional ---                           [phone], Esophagogastroduodenoscopy, flexible,                            transoral; with biopsy, single or multiple Diagnosis Code(s):        --- Professional ---  K22.89, Other specified disease of esophagus                           K31.89, Other diseases of stomach and duodenum                           D13.2, Benign neoplasm of duodenum CPT copyright 2022 American Medical Association. All rights reserved. The codes documented in this report are preliminary and upon coder review may  be revised to meet current compliance requirements. Katrinka Blazing, MD Katrinka Blazing,  08/01/2023 10:07:58 AM This report has been signed electronically. Number of Addenda: 0

## 2023-08-01 NOTE — Op Note (Signed)
 [facility] Patient Name: Brenda Harding Procedure Date: 08/01/2023 9:41 AM MRN: [id number] Date of Birth: [birth year] Attending MD: Katrinka Blazing , , [id number] CSN: [id number] Age: 68 Admit Type: Outpatient Procedure:                Colonoscopy Indications:              Surveillance: History of numerous (> 10) adenomas                            on last colonoscopy (< 3 yrs) Providers:                Katrinka Blazing, Crystal Page, Pandora Leiter,                            Technician Referring MD:              Medicines:                Monitored Anesthesia Care Complications:            No immediate complications. Estimated Blood Loss:     Estimated blood loss: none. Procedure:                Pre-Anesthesia Assessment:                           - Prior to the procedure, a History and Physical                            was performed, and patient medications, allergies                            and sensitivities were reviewed. The patient's                            tolerance of previous anesthesia was reviewed.                           - The risks and benefits of the procedure and the                            sedation options and risks were discussed with the                            patient. All questions were answered and informed                            consent was obtained.                           - ASA Grade Assessment: II - A patient with mild                            systemic disease.                           After obtaining informed consent, the colonoscope  was passed under direct vision. Throughout the                            procedure, the patient's blood pressure, pulse, and                            oxygen saturations were monitored continuously. The                            [id number]-HQ190L ([id number]) scope was introduced through                            the anus and advanced to the the cecum, identified                             by appendiceal orifice and ileocecal valve. The                            colonoscopy was performed without difficulty. The                            patient tolerated the procedure well. The quality                            of the bowel preparation was fair. Scope In: 10:09:47 AM Scope Out: 10:38:20 AM Scope Withdrawal Time: 0 hours 20 minutes 49 seconds  Total Procedure Duration: 0 hours 28 minutes 33 seconds  Findings:      The perianal and digital rectal examinations were normal.      Nine sessile polyps were found in the transverse colon, ascending colon       and cecum. The polyps were 1 to 6 mm in size. These polyps were removed       with a cold snare. Resection and retrieval were complete.      Scattered small-mouthed diverticula were found in the sigmoid colon and       ascending colon.      Non-bleeding internal hemorrhoids were found during retroflexion. The       hemorrhoids were small. Impression:               - Preparation of the colon was fair.                           - Nine 1 to 6 mm polyps in the transverse colon, in                            the ascending colon and in the cecum, removed with                            a cold snare. Resected and retrieved.                           - Diverticulosis in the sigmoid colon and in the  ascending colon.                           - Non-bleeding internal hemorrhoids. Moderate Sedation:      Per Anesthesia Care Recommendation:           - Discharge patient to home (ambulatory).                           - Resume previous diet.                           - Await pathology results.                           - Repeat colonoscopy in 1 year because the bowel                            preparation was suboptimal.                           - Referral to genetic counselor. Procedure Code(s):        --- Professional ---                           [phone], Colonoscopy, flexible; with removal of                             tumor(s), polyp(s), or other lesion(s) by snare                            technique Diagnosis Code(s):        --- Professional ---                           Z86.010, Personal history of colonic polyps                           K64.8, Other hemorrhoids                           D12.3, Benign neoplasm of transverse colon (hepatic                            flexure or splenic flexure)                           D12.2, Benign neoplasm of ascending colon                           D12.0, Benign neoplasm of cecum                           K57.30, Diverticulosis of large intestine without                            perforation or abscess without bleeding CPT copyright 2022 American Medical Association. All rights reserved. The codes documented in this  report are preliminary and upon coder review may  be revised to meet current compliance requirements. Katrinka Blazing, MD Katrinka Blazing,  08/01/2023 10:45:09 AM This report has been signed electronically. Number of Addenda: 0

## 2023-08-01 NOTE — Anesthesia Preprocedure Evaluation (Signed)
 Anesthesia Evaluation  Patient identified by MRN, date of birth, ID band Patient awake    Reviewed: Allergy & Precautions, H&P , NPO status , Patient's Chart, lab work & pertinent test results, reviewed documented beta blocker date and time   Airway Mallampati: II  TM Distance: >3 FB Neck ROM: full    Dental no notable dental hx. (+) Dental Advisory Given, Teeth Intact   Pulmonary neg pulmonary ROS   Pulmonary exam normal breath sounds clear to auscultation       Cardiovascular Exercise Tolerance: Good hypertension, Normal cardiovascular exam Rhythm:regular Rate:Normal     Neuro/Psych  Headaches PSYCHIATRIC DISORDERS Anxiety        GI/Hepatic Neg liver ROS,GERD  ,,  Endo/Other  negative endocrine ROS    Renal/GU CRFRenal disease  negative genitourinary   Musculoskeletal  (+) Arthritis , Osteoarthritis,    Abdominal   Peds  Hematology negative hematology ROS (+)   Anesthesia Other Findings Breast cancer  Reproductive/Obstetrics negative OB ROS                             Anesthesia Physical Anesthesia Plan  ASA: 3  Anesthesia Plan: General   Post-op Pain Management: Minimal or no pain anticipated   Induction: Intravenous  PONV Risk Score and Plan: Propofol infusion  Airway Management Planned: Natural Airway and Nasal Cannula  Additional Equipment: None  Intra-op Plan:   Post-operative Plan:   Informed Consent: I have reviewed the patients History and Physical, chart, labs and discussed the procedure including the risks, benefits and alternatives for the proposed anesthesia with the patient or authorized representative who has indicated his/her understanding and acceptance.     Dental Advisory Given  Plan Discussed with: CRNA  Anesthesia Plan Comments:         Anesthesia Quick Evaluation

## 2023-08-01 NOTE — Telephone Encounter (Signed)
 Thanks

## 2023-08-01 NOTE — Transfer of Care (Signed)
 Immediate Anesthesia Transfer of Care Note  Patient: Brenda Harding  Procedure(s) Performed: COLONOSCOPY EGD (ESOPHAGOGASTRODUODENOSCOPY) POLYPECTOMY  Patient Location: PACU  Anesthesia Type:General  Level of Consciousness: awake, alert , oriented, and patient cooperative  Airway & Oxygen Therapy: Patient Spontanous Breathing  Post-op Assessment: Report given to RN, Post -op Vital signs reviewed and stable, and Patient moving all extremities X 4  Post vital signs: Reviewed and stable  Last Vitals:  Vitals Value Taken Time  BP 91/57 08/01/23 1044  Temp 36.4 C 08/01/23 1044  Pulse 85 08/01/23 1044  Resp 20 08/01/23 1044  SpO2 97 % 08/01/23 1044    Last Pain:  Vitals:   08/01/23 1044  TempSrc: Axillary  PainSc:       Patients Stated Pain Goal: 6 (08/01/23 0845)  Complications: No notable events documented.

## 2023-08-02 ENCOUNTER — Encounter (HOSPITAL_COMMUNITY): Payer: Self-pay | Admitting: Gastroenterology

## 2023-08-03 ENCOUNTER — Other Ambulatory Visit (INDEPENDENT_AMBULATORY_CARE_PROVIDER_SITE_OTHER): Payer: Self-pay | Admitting: Gastroenterology

## 2023-08-03 DIAGNOSIS — B3781 Candidal esophagitis: Secondary | ICD-10-CM

## 2023-08-03 LAB — SURGICAL PATHOLOGY

## 2023-08-03 MED ORDER — NYSTATIN 100000 UNIT/ML MT SUSP
5.0000 mL | Freq: Four times a day (QID) | OROMUCOSAL | 0 refills | Status: AC
Start: 2023-08-03 — End: 2023-08-17

## 2023-08-06 ENCOUNTER — Encounter (INDEPENDENT_AMBULATORY_CARE_PROVIDER_SITE_OTHER): Payer: Self-pay | Admitting: *Deleted

## 2023-08-06 ENCOUNTER — Encounter (INDEPENDENT_AMBULATORY_CARE_PROVIDER_SITE_OTHER): Payer: Self-pay

## 2023-08-06 NOTE — Telephone Encounter (Signed)
 Patient had called the office, left on vm that she had questions. I tried calling patient and got her vm. I left a detailed message that I was going to send the letter that was mailed out to her today through My Chart and if she had any further questions from there she could either call the office back or respond through My Chart.

## 2023-08-15 ENCOUNTER — Telehealth: Payer: Self-pay | Admitting: Genetic Counselor

## 2023-08-15 NOTE — Telephone Encounter (Signed)
 Received message from scheduler Jearlean Mince that patient had questions about genetic testing (coverage for both polyposis genes and breast cancer genes).  Returned call.  Patient unable to talk and requested CB next week.

## 2023-08-16 ENCOUNTER — Telehealth (INDEPENDENT_AMBULATORY_CARE_PROVIDER_SITE_OTHER): Payer: Self-pay | Admitting: *Deleted

## 2023-08-16 NOTE — Telephone Encounter (Addendum)
 Fax from [facility] came in on 08/15/23 asking for refill on nystatin susp. The rx was wrote for the patient to take for 14 days. I called patient on the 16th to see if she requested it and if she was still having issues. I left her a message to return call and have not heard back from patient. I faxed back to pharmacy that this was a one time med and patient should call office if needing a refill.

## 2023-08-20 ENCOUNTER — Encounter: Payer: Self-pay | Admitting: Hematology and Oncology

## 2023-08-20 NOTE — Telephone Encounter (Signed)
 Discussed with patient per Dr. Sammi Crick - We will need to repeat an endoscopy to look for this. However, the benefits of doing this are less than the potential risks. If she were to have issues with swallowing or pain when swallowing, we will repeat an endoscopy.   Patient verbalized understanding and not having any issues currently.

## 2023-08-20 NOTE — Telephone Encounter (Signed)
 We will need to repeat an endoscopy to look for this.  However, the benefits of doing this are less than the potential risks.  If she were to have issues with swallowing or pain when swallowing, we will repeat an endoscopy.

## 2023-08-20 NOTE — Telephone Encounter (Signed)
 Pt called back and said she spilled some of the nystatin  and only had enough for 10 days but called pharmacy and paid out of pocket for the remainder of the 14 days and did not miss a day. She said she was not having any symptoms of having the fungal infection and it was found because she had endoscopy. She states she always rinses her mouth and spits after using her inhaler. Wanted to know if there was a way to know if the fungal infection cleared up?

## 2023-08-21 ENCOUNTER — Telehealth: Payer: Self-pay | Admitting: Genetic Counselor

## 2023-08-21 ENCOUNTER — Encounter: Payer: Self-pay | Admitting: Genetic Counselor

## 2023-08-21 NOTE — Telephone Encounter (Signed)
 Called Ms. Brenda Harding to answer questions about upcoming genetic counseling appt. Discussed PH/FH will be reviewed during pre-test counseling appt with genetic counselor to ensure appropriate test (including polyposis genes, breast cancer genes, other cancer genes) is ordered.  Requested she bring copy of her niece's BRCA1 pos report to her appt.  Ms. Brenda Harding also stated that her daughter had negative genetic testing.  Briefly reviewed importance of testing for Ms. Brenda Harding regardless of her daughter's genetic testing result.

## 2023-09-13 ENCOUNTER — Inpatient Hospital Stay

## 2023-09-13 ENCOUNTER — Encounter: Payer: Self-pay | Admitting: Genetic Counselor

## 2023-09-13 ENCOUNTER — Inpatient Hospital Stay: Attending: Genetic Counselor | Admitting: Genetic Counselor

## 2023-09-13 DIAGNOSIS — Z860100 Personal history of colon polyps, unspecified: Secondary | ICD-10-CM | POA: Diagnosis not present

## 2023-09-13 DIAGNOSIS — Z800 Family history of malignant neoplasm of digestive organs: Secondary | ICD-10-CM

## 2023-09-13 DIAGNOSIS — Z170 Estrogen receptor positive status [ER+]: Secondary | ICD-10-CM

## 2023-09-13 DIAGNOSIS — C50411 Malignant neoplasm of upper-outer quadrant of right female breast: Secondary | ICD-10-CM

## 2023-09-13 LAB — GENETIC SCREENING ORDER

## 2023-09-13 NOTE — Progress Notes (Signed)
 REFERRING PROVIDER: Urban Garden, MD [phone] [address] [place],  [place] [address]  PRIMARY PROVIDER:  Eilene Grater, MD  PRIMARY REASON FOR VISIT:  Encounter Diagnoses  Name Primary?   Malignant neoplasm of upper-outer quadrant of right breast in female, estrogen receptor positive (HCC) Yes   History of colonic polyps    Family history of colon cancer     HISTORY OF PRESENT ILLNESS:   Brenda Harding, a 68 y.o. female, was seen for a [facility] cancer genetics consultation at the request of Dr. Umberto Ganong due to a personal history of colon polyps.  Brenda Harding presents to clinic today to discuss the possibility of a hereditary predisposition to cancer, to discuss genetic testing, and to further clarify her future cancer risks, as well as potential cancer risks for family members.   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 The treatment plan included neoadjuvant chemotherapy, lumpectomy, adjuvant radiation, and anti-estrogens.   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  Colonoscopy in March 2023 detected 12 colon tubular adenomas (2-82mm), and colonoscopy in April 2025 detected nine colon tubular adenomas (1-13mm).  She also has a history of duodenal tubular adenomas (one detected in March 2023; one detected in November 2023).  Recall colonoscopy planned for 1 year.  She had colonoscopies prior to 2023 and only reported 1-2 polyps at that time.   She also has a history of non-melanoma skin cancer on her face in her mid 74s.    CANCER HISTORY:  Oncology History  Malignant neoplasm of upper-outer quadrant of right female breast (HCC)  02/24/2016 Initial Diagnosis   Right breast biopsy 1:00, grade 2-3 IDC, ER 5%, PR 0%, Ki-67 90%, HER-2 positive ratio 7.28, right breast biopsy 1:00 1.5 cm medial to the dominant mass fibrocystic changes; ultrasound and  mammogram 02/23/2016: 2.5 cm mass at 1:00 position and a small 8mm irregular mass 1.5 cm medial to the larger mass, T2 N0 stage II a clinical stage   03/04/2016 Breast MRI   2.9 cm irregular enhancing mass located within the right breast at 1:00 position, several T2 bright lesions in the right lobe of the liver incompletely visualized could be hepatic cysts   03/22/2016 - 07/05/2016 Neo-Adjuvant Chemotherapy   Neo-Adj TCHP 5 cycles followed by Herceptin  and Perjeta    07/07/2016 Breast MRI   Right breast cancer: No measurable enhancement, residual 1-2 mm foci; left breast partially cystic lesion at 2:30 position 2.1 cm which is new 1 indeterminate left axillary lymph node   08/09/2016 Surgery   Right lumpectomy: 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, no residual invasive cancer, 0/4 lymph nodes negative, ER 5% week, PR negative, HER-2 positive, Ki-67 90%: Complete pathologic response ypTis ypN0   09/11/2016 - 10/09/2016 Radiation Therapy   1) Right breast: 42.5 Gy in 16 fractions  2) Right breast boost: 10 Gy in 4 fractions     01/28/2017 -  Anti-estrogen oral therapy   Anastrozole  1 mg daily (prescribed in July, and started in end of September)      SCREENING/RISK FACTORS:  Colonoscopy: see history noted above Hysterectomy: no.  Ovaries intact: yes.  Dermatology screening: yes; annually   Blood transfusion within past 4 weeks: no    Past Medical History:  Diagnosis Date   Anxiety    Arthritis    Breast cancer (HCC)    Bronchitis    HISTORY  Chronic kidney disease    Stage 3 chronic kidney disease   Cough productive of purulent sputum    LIGHT GREEN    Early cataracts, bilateral    GERD (gastroesophageal reflux disease)    Headache    History of wheezing    Hypertension    Malignant neoplasm of upper-outer quadrant of right female breast (HCC) 02/25/2016   Personal history of chemotherapy    Personal history of radiation therapy    Scoliosis     Past Surgical History:  Procedure  Laterality Date   BACK SURGERY     scoliosis spinal surgery 1970   benign tumor removal     1978   BIOPSY  03/17/2022   Procedure: BIOPSY;  Surgeon: Urban Garden, MD;  [facility];  Service: Gastroenterology;;   BREAST BIOPSY     BREAST LUMPECTOMY Right    08/09/2016   BREAST LUMPECTOMY WITH RADIOACTIVE SEED AND SENTINEL LYMPH NODE BIOPSY Right 08/09/2016   Procedure: RIGHT BREAST LUMPECTOMY WITH RADIOACTIVE SEED AND SENTINEL LYMPH NODE BIOPSY;  Surgeon: Lockie Rima, MD;  [facility]: [facility];  Service: General;  Laterality: Right;   CARPAL TUNNEL RELEASE     COLONOSCOPY N/A 08/01/2023   Procedure: COLONOSCOPY;  Surgeon: Urban Garden, MD;  [facility];  Service: Gastroenterology;  Laterality: N/A;  9:45AM;ASA 1   COLONOSCOPY WITH PROPOFOL  N/A 07/19/2021   Procedure: COLONOSCOPY WITH PROPOFOL ;  Surgeon: Urban Garden, MD;  [facility];  Service: Gastroenterology;  Laterality: N/A;  915 ASA 1   ESOPHAGOGASTRODUODENOSCOPY N/A 08/01/2023   Procedure: EGD (ESOPHAGOGASTRODUODENOSCOPY);  Surgeon: Umberto Ganong, Bearl Limes, MD;  [facility];  Service: Gastroenterology;  Laterality: N/A;  9:45AM;ASA 1   ESOPHAGOGASTRODUODENOSCOPY (EGD) WITH PROPOFOL  N/A 07/19/2021   Procedure: ESOPHAGOGASTRODUODENOSCOPY (EGD) WITH PROPOFOL ;  Surgeon: Urban Garden, MD;  [facility];  Service: Gastroenterology;  Laterality: N/A;   ESOPHAGOGASTRODUODENOSCOPY (EGD) WITH PROPOFOL  N/A 03/17/2022   Procedure: ESOPHAGOGASTRODUODENOSCOPY (EGD) WITH PROPOFOL ;  Surgeon: Urban Garden, MD;  [facility];  Service: Gastroenterology;  Laterality: N/A;  730 ASA 2   HEMOSTASIS CLIP PLACEMENT  07/19/2021   Procedure: HEMOSTASIS CLIP PLACEMENT;  Surgeon: Urban Garden, MD;  [facility];  Service: Gastroenterology;;   POLYPECTOMY  07/19/2021   Procedure: POLYPECTOMY;  Surgeon: Urban Garden, MD;  [facility];  Service: Gastroenterology;;   POLYPECTOMY  03/17/2022   Procedure: POLYPECTOMY;  Surgeon: Urban Garden, MD;  [facility];  Service: Gastroenterology;;   POLYPECTOMY  08/01/2023   Procedure: POLYPECTOMY;  Surgeon: Urban Garden, MD;  [facility];  Service: Gastroenterology;;   [facility] REMOVAL Left 03/08/2017   Procedure: REMOVAL PORT-A-CATH;  Surgeon: Lockie Rima, MD;  Location: [facility] OR;  Service: General;  Laterality: Left;   PORTACATH PLACEMENT Left 03/21/2016   Procedure: INSERTION PORT-A-CATH;  Surgeon: Lockie Rima, MD;  Location: [facility];  Service: General;  Laterality: Left;   TRIGGER FINGER RELEASE      FAMILY HISTORY:  We obtained a detailed, 4-generation family history.  Significant diagnoses are listed below: Family History  Problem Relation Age of Onset   Breast cancer Maternal Aunt        dx 76s; d. 23   Ovarian cancer Paternal Grandmother        sertoli-leydig; 84s   Breast cancer Cousin        paternal female cousin; dx primary cancers  Colon cancer Cousin 82       maternal female cousin   Cancer - Other Cousin        neuroendocrine; GI and liver; dx >50     Brenda Harding's niece was recently diagnosed with lung cancer and had genetic testing of her tumor, which showed a BRCA1 mutation called c.4708G>T ([id number]*).  She is unaware if her niece had genetic testing to determine if this variant was present in her germline.  Brenda Harding daughter had negative hereditary cancer genetic testing. No report was available for review today.   There is no reported Ashkenazi Jewish ancestry. There is no known consanguinity.  GENETIC COUNSELING ASSESSMENT: Brenda Harding is a 68 y.o. female with a personal history of more than 20 lifetime tubular adenomas, which somewhat suggestive of a hereditary polyposis syndrome, as well as a personal and family history of breast cancer which is somewhat  suggestive of a hereditary cancer syndrome. We, therefore, discussed and recommended the following at today's visit.   DISCUSSION: We discussed that 5 - 10% of cancer is hereditary.  Most cases of hereditary polyposis are associated with mutations in APC or biallelic mutations in MUTYH.  Most cases of hereditary breast cancer are associated with mutations in BRCA1/2.  There are other genes that can be associated with hereditary breast cancer syndromes.  We reviewed the difference between germline and somatic mutations and discussed that her niece should be offered germline testing that includes BRCA1.  If her niece's BRCA1 mutation is germline in origin, Brenda Harding's chances of the same BRCA1 mutation is 25%.  We discussed that testing is beneficial for several reasons including knowing how to follow individuals for their cancer risks and understanding if other family members could be at an increased risk for cancer and allowing them to undergo genetic testing.   We reviewed the characteristics, features and inheritance patterns of hereditary cancer syndromes. We also discussed genetic testing, including the appropriate family members to test, the process of testing, insurance coverage and turn-around-time for results. We discussed the implications of a negative, positive, carrier and/or variant of uncertain significant result. We recommended Brenda Harding pursue genetic testing for a panel that includes genes associated with colon polyps, colon cancer, breast cancer, and other cancers.   Brenda Harding  was offered a common hereditary cancer panel (~40 genes) and an expanded pan-cancer panel (~70 genes). Brenda Harding was informed of the benefits and limitations of each panel, including that expanded pan-cancer panels contain genes that do not have clear management guidelines at this point in time.  We also discussed that as the number of genes included on a panel increases, the chances of variants of uncertain  significance increases.  After considering the benefits and limitations of each gene panel, Brenda Harding  elected to have a common hereditary cancers panel through [facility].  She said she may be interested in reflexing to a larger panel when results are returned.  We discussed [facility]'s 90 day reflex policy.   The [facility] CustomNext-Cancer +RNAinsight Panel (CancerNext + SMARCA4 + DICER1 + neuroendocrine genes) includes sequencing, deletion/duplication, and RNA analysis for the following 53 genes: APC, ATM, AXIN2, BAP1, BARD1, BMPR1A, BRCA1, BRCA2, BRIP1, CDH1, CDKN1B, CDKN2A, CHEK2, DICER1, EPCAM, FH, FLCN, GREM1, HOXB13, MAX, MBD4, MEN1, MET, MLH1, MSH2, MSH3, MSH6, MUTYH, NF1, NTHL1, PALB2, PMS2, POLD1, POLE, PRKAR1A, PTEN, RAD51C, RAD51D, RET, RPS20, SDHA, SDHAF2, SDHB, SDHC, SDHD, SMAD4, SMARCA4, STK11, TMEM127, TP53, TSC1, TSC2, VHL.    Based on Brenda Harding's  personal history of more than 20 lifetime tubular adenomas, she meets NCCN medical criteria for genetic testing. She is the most informative relative available for genetic testing. Despite that she meets criteria, she may still have an out of pocket cost. We discussed that if her out of pocket cost for testing is over $100, the laboratory should contact her and discuss the self-pay prices and/or patient pay assistance programs.    PLAN: After considering the risks, benefits, and limitations, Brenda Harding provided informed consent to pursue genetic testing and the blood sample was sent to [facility] for analysis of the CustomNext-Cancer +RNAinsight Panel. Results should be available within approximately 3 weeks, at which point they will be disclosed by telephone to Brenda Harding, as will any additional recommendations warranted by these results. Brenda Harding will receive a summary of her genetic counseling visit and a copy of her results once available. This information will also be available in Epic.   Brenda Harding questions were answered to her  satisfaction today. Our contact information was provided should additional questions or concerns arise. Thank you for the referral and allowing us  to share in the care of your patient.   Brenda Harding M. Ora Billing, MS, [facility] Genetic Counselor [email or web address] (P) [phone]   40 minutes were spent on the date of the encounter in service to the patient including preparation, face-to-face consultation, documentation and care coordination.   Drs. Iruku, Gudena and/or Maryalice Smaller were available to discuss this case as needed.    _______________________________________________________________________ For Office Staff:  Number of people involved in session: 2 Was an Intern/ student involved with case: no

## 2023-09-21 ENCOUNTER — Telehealth: Payer: Self-pay | Admitting: Genetic Counselor

## 2023-09-21 NOTE — Telephone Encounter (Signed)
 Ms. Brenda Harding called requesting larger panel for genetic testing.  Sent message to [facility] [facility] requesting CancerNext-Expanded +RNAinsight Panel be ran.    The CancerNext-Expanded gene panel offered by [facility] and includes sequencing, rearrangement, and RNA analysis for the following 77 genes: AIP, ALK, APC, ATM, AXIN2, BAP1, BARD1, BMPR1A, BRCA1, BRCA2, BRIP1, CDC73, CDH1, CDK4, CDKN1B, CDKN2A, CEBPA, CHEK2, CTNNA1, DDX41, DICER1, ETV6, FH, FLCN, GATA2, LZTR1, MAX, MBD4, MEN1, MET, MLH1, MSH2, MSH3, MSH6, MUTYH, NF1, NF2, NTHL1, PALB2, PHOX2B, PMS2, POT1, PRKAR1A, PTCH1, PTEN, RAD51C, RAD51D, RB1, RET, RUNX1, RSP20, SDHA, SDHAF2, SDHB, SDHC, SDHD, SMAD4, SMARCA4, SMARCB1, SMARCE1, STK11, SUFU, TMEM127, TP53, TSC1, TSC2, VHL, and WT1 (sequencing and deletion/duplication); EGFR, HOXB13, KIT, MITF, PDGFRA, POLD1, and POLE (sequencing only); EPCAM and GREM1 (deletion/duplication only).

## 2023-10-01 ENCOUNTER — Encounter: Payer: Self-pay | Admitting: Genetic Counselor

## 2023-10-01 ENCOUNTER — Ambulatory Visit: Payer: Self-pay | Admitting: Genetic Counselor

## 2023-10-01 DIAGNOSIS — Z860100 Personal history of colon polyps, unspecified: Secondary | ICD-10-CM

## 2023-10-01 DIAGNOSIS — Z1379 Encounter for other screening for genetic and chromosomal anomalies: Secondary | ICD-10-CM

## 2023-10-01 DIAGNOSIS — Z170 Estrogen receptor positive status [ER+]: Secondary | ICD-10-CM

## 2023-10-01 DIAGNOSIS — Z800 Family history of malignant neoplasm of digestive organs: Secondary | ICD-10-CM

## 2023-10-01 NOTE — Progress Notes (Signed)
 HPI:   Ms. Troupe was previously seen in the [facility] due to a personal history of colon polyps, a personal and family history of breast cancer, and concerns regarding a hereditary predisposition to cancer.    Ms. Livingston recent genetic test results were disclosed to her by telephone. These results and recommendations are discussed in more detail below.  CANCER HISTORY:  In 2017, at the age of 68, Ms. 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 The treatment plan included neoadjuvant chemotherapy, lumpectomy, adjuvant radiation, and anti-estrogens.    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  Colonoscopy in March 2023 detected 12 colon tubular adenomas (2-68mm), and colonoscopy in April 2025 detected nine colon tubular adenomas (1-78mm).  She also has a history of duodenal tubular adenomas (one detected in March 2023; one detected in November 2023).  Recall colonoscopy planned for 1 year.  She had colonoscopies prior to 2023 and only reported 1-2 polyps at that time.    She also has a history of non-melanoma skin cancer on her face in her mid 54s.    Oncology History  Malignant neoplasm of upper-outer quadrant of right female breast (HCC)  02/24/2016 Initial Diagnosis   Right breast biopsy 1:00, grade 2-3 IDC, ER 5%, PR 0%, Ki-67 90%, HER-2 positive ratio 7.28, right breast biopsy 1:00 1.5 cm medial to the dominant mass fibrocystic changes; ultrasound and mammogram 02/23/2016: 2.5 cm mass at 1:00 position and a small 8mm irregular mass 1.5 cm medial to the larger mass, T2 N0 stage II a clinical stage   03/04/2016 Breast MRI   2.9 cm irregular enhancing mass located within the right breast at 1:00 position, several T2 bright lesions in the right lobe of the liver incompletely visualized could be hepatic cysts   03/22/2016 - 07/05/2016 Neo-Adjuvant Chemotherapy   Neo-Adj TCHP 5 cycles followed by  Herceptin  and Perjeta    07/07/2016 Breast MRI   Right breast cancer: No measurable enhancement, residual 1-2 mm foci; left breast partially cystic lesion at 2:30 position 2.1 cm which is new 1 indeterminate left axillary lymph node   08/09/2016 Surgery   Right lumpectomy: 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, no residual invasive cancer, 0/4 lymph nodes negative, ER 5% week, PR negative, HER-2 positive, Ki-67 90%: Complete pathologic response ypTis ypN0   09/11/2016 - 10/09/2016 Radiation Therapy   1) Right breast: 42.5 Gy in 16 fractions  2) Right breast boost: 10 Gy in 4 fractions     01/28/2017 -  Anti-estrogen oral therapy   Anastrozole  1 mg daily (prescribed in July, and started in end of September)   09/26/2023 Genetic Testing   Negative [facility] CancerNext-Expanded +RNAinsight Panel.    The CancerNext-Expanded gene panel offered by [facility] and includes sequencing, rearrangement, and RNA analysis for the following 77 genes: AIP, ALK, APC, ATM, AXIN2, BAP1, BARD1, BMPR1A, BRCA1, BRCA2, BRIP1, CDC73, CDH1, CDK4, CDKN1B, CDKN2A, CEBPA, CHEK2, CTNNA1, DDX41, DICER1, ETV6, FH, FLCN, GATA2, LZTR1, MAX, MBD4, MEN1, MET, MLH1, MSH2, MSH3, MSH6, MUTYH, NF1, NF2, NTHL1, PALB2, PHOX2B, PMS2, POT1, PRKAR1A, PTCH1, PTEN, RAD51C, RAD51D, RB1, RET, RUNX1, RSP20, SDHA, SDHAF2, SDHB, SDHC, SDHD, SMAD4, SMARCA4, SMARCB1, SMARCE1, STK11, SUFU, TMEM127, TP53, TSC1, TSC2, VHL, and WT1 (sequencing and deletion/duplication); EGFR, HOXB13, KIT, MITF, PDGFRA, POLD1, and POLE (sequencing only); EPCAM and GREM1 (deletion/duplication only).      FAMILY HISTORY:  We obtained a detailed, 4-generation family history.  Significant diagnoses are listed  below: Family History  Problem Relation Age of Onset   Breast cancer Maternal Aunt        dx 72s; d. 26   Ovarian cancer Paternal Grandmother        sertoli-leydig; 75s   Breast cancer Cousin        paternal female cousin; dx primary cancers   Colon cancer Cousin 39        maternal female cousin   Cancer - Other Cousin        neuroendocrine; GI and liver; dx >50      Ms. Lambright's niece was recently diagnosed with lung cancer and had genetic testing of her tumor, which showed a BRCA1 mutation called c.4708G>T ([id number]*).  She is unaware if her niece had genetic testing to determine if this variant was present in her germline.  Ms. Remlinger daughter had negative hereditary cancer genetic testing. No report was available for review today.    There is no reported Ashkenazi Jewish ancestry. There is no known consanguinity.  GENETIC TEST RESULTS:  The [facility] CancerNext-Expanded +RNAinsight Panel found no pathogenic mutations.   The CancerNext-Expanded gene panel offered by [facility] and includes sequencing, rearrangement, and RNA analysis for the following 77 genes: AIP, ALK, APC, ATM, AXIN2, BAP1, BARD1, BMPR1A, BRCA1, BRCA2, BRIP1, CDC73, CDH1, CDK4, CDKN1B, CDKN2A, CEBPA, CHEK2, CTNNA1, DDX41, DICER1, ETV6, FH, FLCN, GATA2, LZTR1, MAX, MBD4, MEN1, MET, MLH1, MSH2, MSH3, MSH6, MUTYH, NF1, NF2, NTHL1, PALB2, PHOX2B, PMS2, POT1, PRKAR1A, PTCH1, PTEN, RAD51C, RAD51D, RB1, RET, RUNX1, RSP20, SDHA, SDHAF2, SDHB, SDHC, SDHD, SMAD4, SMARCA4, SMARCB1, SMARCE1, STK11, SUFU, TMEM127, TP53, TSC1, TSC2, VHL, and WT1 (sequencing and deletion/duplication); EGFR, HOXB13, KIT, MITF, PDGFRA, POLD1, and POLE (sequencing only); EPCAM and GREM1 (deletion/duplication only).   The test report has been scanned into EPIC and is located under the Molecular Pathology section of the Results Review tab.  A portion of the result report is included below for reference. Genetic testing reported out on Sep 27, 2023.       Even though a pathogenic variant was not identified, possible explanations for the cancer/polyps in the family may include: There may be no hereditary risk for cancer in the family. The cancers in Ms. Aispuro and/or her family may be sporadic/familial or due to other genetic  and environmental factors.  Most cancer is not hereditary.  There may be a gene mutation in one of these genes that current testing methods cannot detect but that chance is small. There could be another gene that has not yet been discovered, or that we have not yet tested, that is responsible for the cancer diagnoses in the family.  It is also possible there is a hereditary cause for the cancer in the family that Ms. Old did not inherit.   Therefore, it is important to remain in touch with cancer genetics in the future so that we can continue to offer Ms. Gish the most up to date genetic testing.     ADDITIONAL GENETIC TESTING:   Ms. Taha genetic testing was fairly extensive.  If there are additional relevant genes identified to increase cancer risk that can be analyzed in the future, we would be happy to discuss and coordinate this testing at that time.    CANCER SCREENING RECOMMENDATIONS:  Ms. Munro test result is considered negative (normal).  This means that we have not identified a hereditary cause for her personal history of breast cancer or colon polyps at this time.   An individual's cancer risk  and medical management are not determined by genetic test results alone. Overall cancer risk assessment incorporates additional factors, including personal medical history, family history, and any available genetic information that may result in a personalized plan for cancer prevention and surveillance. Therefore, it is recommended she continue to follow the cancer management and screening guidelines provided by her oncology, gastroenterology, and primary healthcare provider.   RECOMMENDATIONS FOR FAMILY MEMBERS:   Since she did not inherit a identifiable mutation in a cancer predisposition gene included on this panel, her children could not have inherited a known mutation from her in one of these genes. Individuals in this family might be at some increased risk of developing cancer,  over the general population risk, due to the family history of cancer.  Individuals in the family should notify their providers of the family history of cancer. We recommend women in this family have a yearly mammogram beginning at age 78, or 36 years younger than the earliest onset of cancer, an annual clinical breast exam, and perform monthly breast self-exams.  Risk models that take into account family history and hormonal history may be helpful in determining appropriate breast cancer screening options for family members. Relatives should inform their providers of her history of colon polyps to determine an appropriate screening plan.  Other members of the family may still carry a pathogenic variant in one of these genes that Ms. Fickel did not inherit. Based on the family history, we recommend her niece, who was found to have a BRCA1 mutation in her tumor, have genetic counseling and testing to determine if the BRCA1 mutation is in her germline.  Her paternal cousin, who has a history of bilateral breast cancer, is also a candidate for genetic testing. Ms. Lorek can let us  know if we can be of any assistance in coordinating genetic counseling and/or testing for these family members.   FOLLOW-UP:  Cancer genetics is a rapidly advancing field and it is possible that new genetic tests will be appropriate for her and/or her family members in the future. We encourage Ms. Dudas to remain in contact with cancer genetics, so we can update her personal and family histories and let her know of advances in cancer genetics that may benefit this family.   Our contact number was provided.  They are welcome to call us  at anytime with additional questions or concerns.   Keshav Winegar M. Ora Billing, MS, [facility] Genetic Counselor [email or web address] (P) [phone]

## 2023-10-11 ENCOUNTER — Other Ambulatory Visit: Payer: Self-pay

## 2023-12-18 ENCOUNTER — Inpatient Hospital Stay: Payer: Medicare Other | Attending: Hematology and Oncology | Admitting: Hematology and Oncology

## 2023-12-18 VITALS — BP 149/71 | HR 99 | Temp 98.2°F | Resp 18 | Ht 65.0 in | Wt 183.5 lb

## 2023-12-18 DIAGNOSIS — Z08 Encounter for follow-up examination after completed treatment for malignant neoplasm: Secondary | ICD-10-CM | POA: Insufficient documentation

## 2023-12-18 DIAGNOSIS — R5383 Other fatigue: Secondary | ICD-10-CM | POA: Diagnosis not present

## 2023-12-18 DIAGNOSIS — Z853 Personal history of malignant neoplasm of breast: Secondary | ICD-10-CM | POA: Insufficient documentation

## 2023-12-18 DIAGNOSIS — M2560 Stiffness of unspecified joint, not elsewhere classified: Secondary | ICD-10-CM | POA: Diagnosis not present

## 2023-12-18 DIAGNOSIS — C50411 Malignant neoplasm of upper-outer quadrant of right female breast: Secondary | ICD-10-CM | POA: Diagnosis not present

## 2023-12-18 DIAGNOSIS — Z170 Estrogen receptor positive status [ER+]: Secondary | ICD-10-CM | POA: Diagnosis not present

## 2023-12-18 DIAGNOSIS — R232 Flushing: Secondary | ICD-10-CM | POA: Diagnosis not present

## 2023-12-18 DIAGNOSIS — M810 Age-related osteoporosis without current pathological fracture: Secondary | ICD-10-CM | POA: Insufficient documentation

## 2023-12-18 DIAGNOSIS — L659 Nonscarring hair loss, unspecified: Secondary | ICD-10-CM | POA: Insufficient documentation

## 2023-12-18 DIAGNOSIS — Z923 Personal history of irradiation: Secondary | ICD-10-CM | POA: Diagnosis not present

## 2023-12-18 NOTE — Progress Notes (Signed)
 Patient Care Team: Toy Laurance POUR, MD as PCP - General (Internal Medicine) Rosalynn LELON Ingle, MD (Inactive) as Consulting Physician (Obstetrics and Gynecology) Aron Shoulders, MD as Consulting Physician (General Surgery) Odean Potts, MD as Consulting Physician (Hematology and Oncology) Dewey Rush, MD as Consulting Physician (Radiation Oncology) Crawford Morna Pickle, NP as Nurse Practitioner (Hematology and Oncology)  DIAGNOSIS:  Encounter Diagnosis  Name Primary?   Malignant neoplasm of upper-outer quadrant of right breast in female, estrogen receptor positive (HCC) Yes    SUMMARY OF ONCOLOGIC HISTORY: Oncology History  Malignant neoplasm of upper-outer quadrant of right female breast (HCC)  02/24/2016 Initial Diagnosis   Right breast biopsy 1:00, grade 2-3 IDC, ER 5%, PR 0%, Ki-67 90%, HER-2 positive ratio 7.28, right breast biopsy 1:00 1.5 cm medial to the dominant mass fibrocystic changes; ultrasound and mammogram 02/23/2016: 2.5 cm mass at 1:00 position and a small 8mm irregular mass 1.5 cm medial to the larger mass, T2 N0 stage II a clinical stage   03/04/2016 Breast MRI   2.9 cm irregular enhancing mass located within the right breast at 1:00 position, several T2 bright lesions in the right lobe of the liver incompletely visualized could be hepatic cysts   03/22/2016 - 07/05/2016 Neo-Adjuvant Chemotherapy   Neo-Adj TCHP 5 cycles followed by Herceptin  and Perjeta    07/07/2016 Breast MRI   Right breast cancer: No measurable enhancement, residual 1-2 mm foci; left breast partially cystic lesion at 2:30 position 2.1 cm which is new 1 indeterminate left axillary lymph node   08/09/2016 Surgery   Right lumpectomy: 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, no residual invasive cancer, 0/4 lymph nodes negative, ER 5% week, PR negative, HER-2 positive, Ki-67 90%: Complete pathologic response ypTis ypN0   09/11/2016 - 10/09/2016 Radiation Therapy   1) Right breast: 42.5 Gy in 16 fractions  2) Right  breast boost: 10 Gy in 4 fractions     01/28/2017 -  Anti-estrogen oral therapy   Anastrozole  1 mg daily (prescribed in July, and started in end of September)   09/26/2023 Genetic Testing   Negative [facility] CancerNext-Expanded +RNAinsight Panel.    The CancerNext-Expanded gene panel offered by [facility] and includes sequencing, rearrangement, and RNA analysis for the following 77 genes: AIP, ALK, APC, ATM, AXIN2, BAP1, BARD1, BMPR1A, BRCA1, BRCA2, BRIP1, CDC73, CDH1, CDK4, CDKN1B, CDKN2A, CEBPA, CHEK2, CTNNA1, DDX41, DICER1, ETV6, FH, FLCN, GATA2, LZTR1, MAX, MBD4, MEN1, MET, MLH1, MSH2, MSH3, MSH6, MUTYH, NF1, NF2, NTHL1, PALB2, PHOX2B, PMS2, POT1, PRKAR1A, PTCH1, PTEN, RAD51C, RAD51D, RB1, RET, RUNX1, RSP20, SDHA, SDHAF2, SDHB, SDHC, SDHD, SMAD4, SMARCA4, SMARCB1, SMARCE1, STK11, SUFU, TMEM127, TP53, TSC1, TSC2, VHL, and WT1 (sequencing and deletion/duplication); EGFR, HOXB13, KIT, MITF, PDGFRA, POLD1, and POLE (sequencing only); EPCAM and GREM1 (deletion/duplication only).      CHIEF COMPLIANT: Follow-up on anastrozole  therapy  HISTORY OF PRESENT ILLNESS:   History of Present Illness Brenda Harding is a 68 year old female with a history of breast cancer who presents for follow-up after completing anastrozole  therapy.  She completed a seven-year course of anastrozole , which began in September 2018. She experienced hair thinning, particularly on one side of her head, which has not grown back evenly. She hopes for some hair regrowth now that she has stopped the medication.  Her overall health remains stable. She is diligent with her mammograms, with the last breast exam conducted by her OB GYN last month.  She remains active through walking and has recently started Pilates to address hip and back problems,  including scoliosis and spinal stenosis. She notes improvements in flexibility and hip comfort from these exercises.     ALLERGIES:  is allergic to propoxyphene, inapsine  [droperidol], latex, and levaquin  [levofloxacin ].  MEDICATIONS:  Current Outpatient Medications  Medication Sig Dispense Refill   acetaminophen  (TYLENOL ) 650 MG CR tablet Take 1,300 mg by mouth every 8 (eight) hours as needed for pain.     albuterol (PROVENTIL HFA;VENTOLIN HFA) 108 (90 Base) MCG/ACT inhaler Inhale 2 puffs into the lungs 2 (two) times daily as needed for wheezing or shortness of breath.      ALPRAZolam  (XANAX ) 0.5 MG tablet Take 1 tablet (0.5 mg total) by mouth 2 (two) times daily as needed (for anxiety). 60 tablet 0   anastrozole  (ARIMIDEX ) 1 MG tablet Take 1 tablet (1 mg total) by mouth at bedtime. 90 tablet 3   betamethasone dipropionate (DIPROLENE) 0.05 % cream Apply 1 application topically 2 (two) times a week. Applied to vagina on Mon and Thurs     BREO ELLIPTA 200-25 MCG/INH AEPB Inhale 1 puff into the lungs daily.     Cholecalciferol (VITAMIN D3) 50 MCG (2000 UT) capsule Take 1 capsule (2,000 Units total) by mouth daily.     famotidine  (PEPCID ) 20 MG tablet Take 1 tablet (20 mg total) by mouth 2 (two) times daily.     guaiFENesin (MUCINEX) 600 MG 12 hr tablet Take 600 mg by mouth 2 (two) times daily as needed for cough or to loosen phlegm.     lansoprazole (PREVACID) 30 MG capsule Take 30 mg by mouth in the morning.     levocetirizine (XYZAL) 5 MG tablet Take 5 mg by mouth every evening.     Multiple Vitamins-Minerals (HAIR SKIN AND NAILS FORMULA) TABS Take by mouth.     PREMARIN vaginal cream Place 1 Applicatorful vaginally 2 (two) times a week. Tuesday & Saturday     risedronate (ACTONEL) 150 MG tablet Take 150 mg by mouth every 30 (thirty) days. with water  on empty stomach, nothing by mouth or lie down for next 30 minutes.     rosuvastatin  (CRESTOR ) 10 MG tablet Take 1 tablet (10 mg total) by mouth daily.     spironolactone (ALDACTONE) 50 MG tablet Take 50 mg by mouth 2 (two) times daily.      Triamcinolone Acetonide (NASACORT ALLERGY 24HR NA) Place 2 sprays into the  nose daily.      valsartan  (DIOVAN ) 160 MG tablet Take 80 mg by mouth daily.     verapamil  (CALAN -SR) 240 MG CR tablet Take 240 mg by mouth at bedtime.     No current facility-administered medications for this visit.   Facility-Administered Medications Ordered in Other Visits  Medication Dose Route Frequency Provider Last Rate Last Admin   sodium chloride  flush (NS) 0.9 % injection 10 mL  10 mL Intravenous PRN Cherrie Franca, MD   10 mL at 09/27/16 1326   sodium chloride  flush (NS) 0.9 % injection 10 mL  10 mL Intracatheter PRN Odean Potts, MD   10 mL at 12/25/16 1450    PHYSICAL EXAMINATION: ECOG PERFORMANCE STATUS: 1 - Symptomatic but completely ambulatory  Vitals:   12/18/23 1350  BP: (!) 149/71  Pulse: 99  Resp: 18  Temp: 98.2 F (36.8 C)  SpO2: 100%   Filed Weights   12/18/23 1350  Weight: 183 lb 8 oz (83.2 kg)    LABORATORY DATA:  I have reviewed the data as listed    Latest Ref Rng & Units  03/14/2022    1:09 PM 07/19/2021    9:55 AM 09/09/2018   11:14 AM  CMP  Glucose 70 - 99 mg/dL 95  86  83   BUN 8 - 23 mg/dL 26  19  22    Creatinine 0.44 - 1.00 mg/dL 8.49  8.73  8.71   Sodium 135 - 145 mmol/L 136  139  140   Potassium 3.5 - 5.1 mmol/L 4.3  4.0  4.2   Chloride 98 - 111 mmol/L 103  104  101   CO2 22 - 32 mmol/L 27  24  29    Calcium  8.9 - 10.3 mg/dL 9.4  9.1  9.6   Total Protein 6.5 - 8.1 g/dL   7.4   Total Bilirubin 0.3 - 1.2 mg/dL   0.5   Alkaline Phos 38 - 126 U/L   120   AST 15 - 41 U/L   16   ALT 0 - 44 U/L   17     Lab Results  Component Value Date   WBC 5.2 09/09/2019   HGB 12.4 09/09/2019   HCT 37.5 09/09/2019   MCV 100.3 (H) 09/09/2019   PLT 220 09/09/2019   NEUTROABS 3.0 09/09/2019    ASSESSMENT & PLAN:  Malignant neoplasm of upper-outer quadrant of right female breast (HCC) Right breast biopsy 1:00, grade 2-3 IDC, ER 5%, PR 0%, Ki-67 90%, HER-2 positive ratio 7.28, right breast biopsy 1:00 1.5 cm medial to the dominant mass fibrocystic  changes; ultrasound and mammogram 02/23/2016: 2.5 cm mass at 1:00 position and a small 8mm irregular mass 1.5 cm medial to the larger mass, T2 N0 stage II a clinical stage   Treatment summary: 1. Neoadjuvant chemotherapy with [facility] Perjeta  6 cycles followed by Herceptin  and Perjeta  maintenance for 1 year 03/22/2016- 07/05/2017 2. Breast conserving surgery with sentinel lymph node study: 08/08/2016 3. Followed by adjuvant radiation therapy 09/11/16- 10/09/16 4. Followed by antiestrogen therapy started July 2018   Right lumpectomy 08/08/2016: 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, no residual invasive cancer, 0/4 lymph nodes negative, ER 5% weak, PR negative, HER-2 positive, Ki-67 90%: Complete pathologic response ypTis ypN0 ------------------------------------------------------------------------------- Current Treatment: Anastrozole  started September 2018.   Anastrozole  toxicities: Mild fatigue 2. Occasional hot flash 3. Vaginal dryness: Improved with vaginal creams 4. Joint stiffness: Taking turmeric which is helping 5.  Hair thinning: Periodically   Breast cancer index: Predicts that she would benefit from extended adjuvant therapy Completed 7 years of antiestrogen therapy and therefore she could discontinue it.   Breast cancer surveillance: Mammograms 07/06/2023: Benign breast density category B Bone density 01/11/2022: T score - 3.1 same as in 2022 (used to be -2.4): Osteoporosis: On Prolia from her PCP   Osteoporosis: Bone density 01/11/2022: T score -3.1: Osteoporosis: Same as it was in 2022.   Return to clinic on an as-needed basis. ------------------------------------- Assessment and Plan Assessment & Plan Estrogen receptor positive malignant neoplasm of upper-outer quadrant of right breast, status post endocrine therapy Completed seven years of anastrozole  therapy with alopecia as the primary side effect. Anastrozole 's benefits will persist post-cessation. - Discontinue anastrozole  as the  seven-year treatment duration is complete. - Continue regular mammograms and breast exams as per schedule. - Encourage follow-up for any concerns or questions.  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 She anticipates a 50% return of hair volume post-therapy cessation.      No orders of the defined types were placed in this  encounter.  The patient has a good understanding of the overall plan. she agrees with it. she will call with any problems that may develop before the next visit here. Total time spent: 30 mins including face to face time and time spent for planning, charting and co-ordination of care   Brenda MARLA Chad, MD 12/18/23

## 2023-12-18 NOTE — Assessment & Plan Note (Signed)
 Right breast biopsy 1:00, grade 2-3 IDC, ER 5%, PR 0%, Ki-67 90%, HER-2 positive ratio 7.28, right breast biopsy 1:00 1.5 cm medial to the dominant mass fibrocystic changes; ultrasound and mammogram 02/23/2016: 2.5 cm mass at 1:00 position and a small 8mm irregular mass 1.5 cm medial to the larger mass, T2 N0 stage II a clinical stage   Treatment summary: 1. Neoadjuvant chemotherapy with [facility] Perjeta  6 cycles followed by Herceptin  and Perjeta  maintenance for 1 year 03/22/2016- 07/05/2017 2. Breast conserving surgery with sentinel lymph node study: 08/08/2016 3. Followed by adjuvant radiation therapy 09/11/16- 10/09/16 4. Followed by antiestrogen therapy started July 2018   Right lumpectomy 08/08/2016: 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, no residual invasive cancer, 0/4 lymph nodes negative, ER 5% weak, PR negative, HER-2 positive, Ki-67 90%: Complete pathologic response ypTis ypN0 ------------------------------------------------------------------------------- Current Treatment: Anastrozole  started September 2018.   Anastrozole  toxicities: Mild fatigue 2. Occasional hot flash 3. Vaginal dryness: Improved with vaginal creams 4. Joint stiffness: Taking turmeric which is helping 5.  Hair thinning: Periodically   Breast cancer index: Predicts that she would benefit from extended adjuvant therapy Completed 7 years of antiestrogen therapy and therefore she could discontinue it.   Breast cancer surveillance: Mammograms 07/06/2023: Benign breast density category B Bone density 01/11/2022: T score - 3.1 same as in 2022 (used to be -2.4): Osteoporosis: On Prolia from her PCP Breast exam 12/18/2023: Benign    Osteoporosis: Bone density 01/11/2022: T score -3.1: Osteoporosis: Same as it was in 2022.   Return to clinic on an as-needed basis.

## 2024-02-13 ENCOUNTER — Encounter (INDEPENDENT_AMBULATORY_CARE_PROVIDER_SITE_OTHER): Payer: Self-pay | Admitting: Gastroenterology

## 2024-05-13 ENCOUNTER — Ambulatory Visit: Payer: Self-pay | Admitting: Orthopedic Surgery

## 2024-06-09 ENCOUNTER — Encounter (HOSPITAL_COMMUNITY): Admission: RE | Admit: 2024-06-09

## 2024-06-18 ENCOUNTER — Ambulatory Visit (HOSPITAL_COMMUNITY): Admit: 2024-06-18 | Admitting: Specialist

## 2024-06-18 SURGERY — ARTHROPLASTY, KNEE, TOTAL
Anesthesia: Spinal | Site: Knee | Laterality: Left
